# Patient Record
Sex: Male | Born: 1940 | Race: Black or African American | Hispanic: No | Marital: Married | Smoking: Current every day smoker
Health system: Southern US, Community
[De-identification: ages and names within clinical notes are randomized; demographics above are authoritative.]

## PROBLEM LIST (undated history)

## (undated) DIAGNOSIS — F17210 Nicotine dependence, cigarettes, uncomplicated: Secondary | ICD-10-CM

## (undated) DIAGNOSIS — K5730 Diverticulosis of large intestine without perforation or abscess without bleeding: Secondary | ICD-10-CM

## (undated) DIAGNOSIS — Z8701 Personal history of pneumonia (recurrent): Secondary | ICD-10-CM

## (undated) DIAGNOSIS — K219 Gastro-esophageal reflux disease without esophagitis: Secondary | ICD-10-CM

## (undated) DIAGNOSIS — R5383 Other fatigue: Secondary | ICD-10-CM

## (undated) DIAGNOSIS — K635 Polyp of colon: Secondary | ICD-10-CM

## (undated) DIAGNOSIS — I82409 Acute embolism and thrombosis of unspecified deep veins of unspecified lower extremity: Secondary | ICD-10-CM

## (undated) DIAGNOSIS — E7800 Pure hypercholesterolemia, unspecified: Secondary | ICD-10-CM

## (undated) DIAGNOSIS — M542 Cervicalgia: Secondary | ICD-10-CM

## (undated) DIAGNOSIS — F5104 Psychophysiologic insomnia: Secondary | ICD-10-CM

## (undated) DIAGNOSIS — M47817 Spondylosis without myelopathy or radiculopathy, lumbosacral region: Secondary | ICD-10-CM

## (undated) DIAGNOSIS — F419 Anxiety disorder, unspecified: Secondary | ICD-10-CM

## (undated) DIAGNOSIS — R9389 Abnormal findings on diagnostic imaging of other specified body structures: Secondary | ICD-10-CM

## (undated) DIAGNOSIS — I4891 Unspecified atrial fibrillation: Secondary | ICD-10-CM

## (undated) DIAGNOSIS — J449 Chronic obstructive pulmonary disease, unspecified: Secondary | ICD-10-CM

## (undated) DIAGNOSIS — F1010 Alcohol abuse, uncomplicated: Secondary | ICD-10-CM

## (undated) DIAGNOSIS — R350 Frequency of micturition: Secondary | ICD-10-CM

## (undated) HISTORY — DX: Anxiety disorder, unspecified: F41.9

## (undated) HISTORY — DX: Cervicalgia: M54.2

## (undated) HISTORY — DX: Acute embolism and thrombosis of unspecified deep veins of unspecified lower extremity: I82.409

## (undated) HISTORY — PX: OTHER SURGICAL HISTORY: SHX169

## (undated) HISTORY — DX: Frequency of micturition: R35.0

## (undated) HISTORY — DX: Personal history of pneumonia (recurrent): Z87.01

## (undated) HISTORY — DX: Unspecified atrial fibrillation: I48.91

## (undated) HISTORY — DX: Spondylosis without myelopathy or radiculopathy, lumbosacral region: M47.817

## (undated) HISTORY — DX: Alcohol abuse, uncomplicated: F10.10

## (undated) HISTORY — DX: Abnormal findings on diagnostic imaging of other specified body structures: R93.89

## (undated) HISTORY — DX: Polyp of colon: K63.5

## (undated) HISTORY — DX: Nicotine dependence, cigarettes, uncomplicated: F17.210

## (undated) HISTORY — DX: Pure hypercholesterolemia, unspecified: E78.00

## (undated) HISTORY — DX: Chronic obstructive pulmonary disease, unspecified: J44.9

## (undated) HISTORY — DX: Psychophysiologic insomnia: F51.04

## (undated) HISTORY — DX: Gastro-esophageal reflux disease without esophagitis: K21.9

## (undated) HISTORY — DX: Other fatigue: R53.83

## (undated) HISTORY — DX: Diverticulosis of large intestine without perforation or abscess without bleeding: K57.30

---

## 1998-10-15 ENCOUNTER — Ambulatory Visit (HOSPITAL_COMMUNITY): Admission: RE | Admit: 1998-10-15 | Discharge: 1998-10-15 | Payer: Self-pay | Admitting: *Deleted

## 1998-11-19 ENCOUNTER — Ambulatory Visit (HOSPITAL_COMMUNITY): Admission: RE | Admit: 1998-11-19 | Discharge: 1998-11-19 | Payer: Self-pay | Admitting: *Deleted

## 1999-08-20 ENCOUNTER — Encounter (INDEPENDENT_AMBULATORY_CARE_PROVIDER_SITE_OTHER): Payer: Self-pay | Admitting: Specialist

## 1999-08-20 ENCOUNTER — Other Ambulatory Visit: Admission: RE | Admit: 1999-08-20 | Discharge: 1999-08-20 | Payer: Self-pay | Admitting: Gastroenterology

## 2001-02-14 ENCOUNTER — Emergency Department (HOSPITAL_COMMUNITY): Admission: EM | Admit: 2001-02-14 | Discharge: 2001-02-14 | Payer: Self-pay

## 2005-03-26 ENCOUNTER — Emergency Department (HOSPITAL_COMMUNITY): Admission: EM | Admit: 2005-03-26 | Discharge: 2005-03-26 | Payer: Self-pay | Admitting: Family Medicine

## 2005-04-02 ENCOUNTER — Ambulatory Visit: Payer: Self-pay | Admitting: Pulmonary Disease

## 2005-04-05 ENCOUNTER — Encounter (HOSPITAL_BASED_OUTPATIENT_CLINIC_OR_DEPARTMENT_OTHER): Admission: RE | Admit: 2005-04-05 | Discharge: 2005-04-25 | Payer: Self-pay | Admitting: Surgery

## 2005-04-08 ENCOUNTER — Ambulatory Visit: Payer: Self-pay | Admitting: Pulmonary Disease

## 2005-07-23 ENCOUNTER — Ambulatory Visit: Payer: Self-pay | Admitting: Pulmonary Disease

## 2005-08-02 ENCOUNTER — Ambulatory Visit (HOSPITAL_COMMUNITY): Admission: RE | Admit: 2005-08-02 | Discharge: 2005-08-02 | Payer: Self-pay | Admitting: Neurosurgery

## 2005-12-02 ENCOUNTER — Ambulatory Visit: Payer: Self-pay | Admitting: Internal Medicine

## 2005-12-16 ENCOUNTER — Ambulatory Visit: Payer: Self-pay | Admitting: Pulmonary Disease

## 2006-02-24 ENCOUNTER — Ambulatory Visit: Payer: Self-pay | Admitting: Pulmonary Disease

## 2006-02-24 LAB — CONVERTED CEMR LAB
ALT: 25 units/L (ref 0–40)
AST: 33 units/L (ref 0–37)
BUN: 11 mg/dL (ref 6–23)
Calcium: 9.3 mg/dL (ref 8.4–10.5)
Creatinine, Ser: 1.2 mg/dL (ref 0.4–1.5)
Glucose, Bld: 95 mg/dL (ref 70–99)
Total Protein: 7 g/dL (ref 6.0–8.3)

## 2006-02-26 ENCOUNTER — Ambulatory Visit: Payer: Self-pay | Admitting: Cardiology

## 2006-07-03 ENCOUNTER — Ambulatory Visit: Payer: Self-pay | Admitting: Pulmonary Disease

## 2006-07-03 LAB — CONVERTED CEMR LAB
ALT: 31 units/L (ref 0–40)
AST: 42 units/L — ABNORMAL HIGH (ref 0–37)
Alkaline Phosphatase: 73 units/L (ref 39–117)
Basophils Relative: 0.3 % (ref 0.0–1.0)
Calcium: 8.9 mg/dL (ref 8.4–10.5)
Eosinophils Relative: 0.8 % (ref 0.0–5.0)
GFR calc Af Amer: 108 mL/min
GFR calc non Af Amer: 89 mL/min
HCT: 40.4 % (ref 39.0–52.0)
Monocytes Absolute: 0.5 10*3/uL (ref 0.2–0.7)
Monocytes Relative: 14.2 % — ABNORMAL HIGH (ref 3.0–11.0)
Neutro Abs: 1.4 10*3/uL (ref 1.4–7.7)
Platelets: 275 10*3/uL (ref 150–400)
Potassium: 4.2 meq/L (ref 3.5–5.1)
RBC: 3.92 M/uL — ABNORMAL LOW (ref 4.22–5.81)
RDW: 13.4 % (ref 11.5–14.6)
Sodium: 141 meq/L (ref 135–145)
Total Protein: 6.6 g/dL (ref 6.0–8.3)
WBC: 3.5 10*3/uL — ABNORMAL LOW (ref 4.5–10.5)

## 2007-07-24 DIAGNOSIS — K219 Gastro-esophageal reflux disease without esophagitis: Secondary | ICD-10-CM | POA: Insufficient documentation

## 2007-07-24 DIAGNOSIS — J449 Chronic obstructive pulmonary disease, unspecified: Secondary | ICD-10-CM | POA: Insufficient documentation

## 2007-12-21 ENCOUNTER — Ambulatory Visit: Payer: Self-pay | Admitting: Pulmonary Disease

## 2007-12-21 DIAGNOSIS — I739 Peripheral vascular disease, unspecified: Secondary | ICD-10-CM

## 2007-12-21 DIAGNOSIS — F1010 Alcohol abuse, uncomplicated: Secondary | ICD-10-CM

## 2007-12-21 DIAGNOSIS — E7800 Pure hypercholesterolemia, unspecified: Secondary | ICD-10-CM

## 2007-12-21 DIAGNOSIS — G4700 Insomnia, unspecified: Secondary | ICD-10-CM | POA: Insufficient documentation

## 2007-12-21 DIAGNOSIS — F17200 Nicotine dependence, unspecified, uncomplicated: Secondary | ICD-10-CM | POA: Insufficient documentation

## 2007-12-21 DIAGNOSIS — F411 Generalized anxiety disorder: Secondary | ICD-10-CM

## 2007-12-21 DIAGNOSIS — R930 Abnormal findings on diagnostic imaging of skull and head, not elsewhere classified: Secondary | ICD-10-CM

## 2007-12-21 DIAGNOSIS — D126 Benign neoplasm of colon, unspecified: Secondary | ICD-10-CM

## 2007-12-21 DIAGNOSIS — R350 Frequency of micturition: Secondary | ICD-10-CM

## 2007-12-21 DIAGNOSIS — K5730 Diverticulosis of large intestine without perforation or abscess without bleeding: Secondary | ICD-10-CM

## 2007-12-21 DIAGNOSIS — M542 Cervicalgia: Secondary | ICD-10-CM

## 2007-12-21 DIAGNOSIS — R5383 Other fatigue: Secondary | ICD-10-CM | POA: Insufficient documentation

## 2007-12-21 DIAGNOSIS — M47817 Spondylosis without myelopathy or radiculopathy, lumbosacral region: Secondary | ICD-10-CM

## 2008-01-03 LAB — CONVERTED CEMR LAB
ALT: 32 units/L (ref 0–53)
AST: 31 units/L (ref 0–37)
Albumin: 3.3 g/dL — ABNORMAL LOW (ref 3.5–5.2)
Alkaline Phosphatase: 70 units/L (ref 39–117)
BUN: 11 mg/dL (ref 6–23)
Bacteria, UA: NEGATIVE
Basophils Absolute: 0 10*3/uL (ref 0.0–0.1)
Basophils Relative: 0 % (ref 0.0–3.0)
Bilirubin Urine: NEGATIVE
Bilirubin, Direct: 0.1 mg/dL (ref 0.0–0.3)
CO2: 27 meq/L (ref 19–32)
Calcium: 8.9 mg/dL (ref 8.4–10.5)
Chloride: 107 meq/L (ref 96–112)
Creatinine, Ser: 0.9 mg/dL (ref 0.4–1.5)
Crystals: NEGATIVE
Eosinophils Absolute: 0 10*3/uL (ref 0.0–0.7)
Eosinophils Relative: 0.9 % (ref 0.0–5.0)
GFR calc Af Amer: 108 mL/min
GFR calc non Af Amer: 89 mL/min
Glucose, Bld: 81 mg/dL (ref 70–99)
HCT: 39.8 % (ref 39.0–52.0)
Hemoglobin, Urine: NEGATIVE
Hemoglobin: 13.8 g/dL (ref 13.0–17.0)
Ketones, ur: NEGATIVE mg/dL
Leukocytes, UA: NEGATIVE
Lymphocytes Relative: 42.5 % (ref 12.0–46.0)
MCHC: 34.8 g/dL (ref 30.0–36.0)
MCV: 106.4 fL — ABNORMAL HIGH (ref 78.0–100.0)
Monocytes Absolute: 0.6 10*3/uL (ref 0.1–1.0)
Monocytes Relative: 17.9 % — ABNORMAL HIGH (ref 3.0–12.0)
Neutro Abs: 1.3 10*3/uL — ABNORMAL LOW (ref 1.4–7.7)
Neutrophils Relative %: 38.7 % — ABNORMAL LOW (ref 43.0–77.0)
Nitrite: NEGATIVE
PSA: 0.56 ng/mL (ref 0.10–4.00)
Platelets: 388 10*3/uL (ref 150–400)
Potassium: 3.8 meq/L (ref 3.5–5.1)
RBC: 3.74 M/uL — ABNORMAL LOW (ref 4.22–5.81)
RDW: 12.5 % (ref 11.5–14.6)
Sed Rate: 24 mm/hr — ABNORMAL HIGH (ref 0–16)
Sodium: 140 meq/L (ref 135–145)
Specific Gravity, Urine: >=1.03 (ref 1.000–1.03)
TSH: 1.62 microintl units/mL (ref 0.35–5.50)
Total Bilirubin: 0.5 mg/dL (ref 0.3–1.2)
Total Protein, Urine: NEGATIVE mg/dL
Total Protein: 6.5 g/dL (ref 6.0–8.3)
Urine Glucose: NEGATIVE mg/dL
Urobilinogen, UA: 0.2 (ref 0.0–1.0)
WBC, UA: NONE SEEN cells/hpf
WBC: 3.4 10*3/uL — ABNORMAL LOW (ref 4.5–10.5)
pH: 5.5 (ref 5.0–8.0)

## 2008-06-28 ENCOUNTER — Encounter (INDEPENDENT_AMBULATORY_CARE_PROVIDER_SITE_OTHER): Payer: Self-pay | Admitting: *Deleted

## 2008-08-17 ENCOUNTER — Emergency Department (HOSPITAL_COMMUNITY): Admission: EM | Admit: 2008-08-17 | Discharge: 2008-08-17 | Payer: Self-pay | Admitting: Family Medicine

## 2008-11-15 ENCOUNTER — Ambulatory Visit: Payer: Self-pay | Admitting: Pulmonary Disease

## 2008-11-15 ENCOUNTER — Ambulatory Visit: Payer: Self-pay | Admitting: Gastroenterology

## 2008-11-15 ENCOUNTER — Encounter: Payer: Self-pay | Admitting: Adult Health

## 2008-11-15 LAB — CONVERTED CEMR LAB
Basophils Relative: 0.8 % (ref 0.0–3.0)
HCT: 48.3 % (ref 39.0–52.0)
MCHC: 34 g/dL (ref 30.0–36.0)
MCV: 104.6 fL — ABNORMAL HIGH (ref 78.0–100.0)
Monocytes Relative: 11.6 % (ref 3.0–12.0)
Neutrophils Relative %: 56.4 % (ref 43.0–77.0)

## 2008-11-22 LAB — CONVERTED CEMR LAB
Albumin: 3.9 g/dL (ref 3.5–5.2)
Alkaline Phosphatase: 79 units/L (ref 39–117)
Bilirubin, Direct: 0.2 mg/dL (ref 0.0–0.3)
Chloride: 101 meq/L (ref 96–112)
Cholesterol: 197 mg/dL (ref 0–200)
Creatinine, Ser: 1 mg/dL (ref 0.4–1.5)
Glucose, Bld: 85 mg/dL (ref 70–99)
HDL: 73.1 mg/dL (ref >39.00)
Hemoglobin, Urine: NEGATIVE
Hgb A1c MFr Bld: 6 % (ref 4.6–6.5)
Ketones, ur: 15 mg/dL
Leukocytes, UA: NEGATIVE
PSA: 0.51 ng/mL (ref 0.10–4.00)
TSH: 1.17 microintl units/mL (ref 0.35–5.50)
Total Bilirubin: 0.8 mg/dL (ref 0.3–1.2)
Total Protein: 7.7 g/dL (ref 6.0–8.3)
Triglycerides: 58 mg/dL (ref 0.0–149.0)

## 2008-12-02 ENCOUNTER — Ambulatory Visit: Payer: Self-pay | Admitting: Gastroenterology

## 2008-12-02 ENCOUNTER — Encounter: Payer: Self-pay | Admitting: Gastroenterology

## 2008-12-06 ENCOUNTER — Encounter: Payer: Self-pay | Admitting: Gastroenterology

## 2009-06-27 ENCOUNTER — Ambulatory Visit: Payer: Self-pay | Admitting: Pulmonary Disease

## 2009-06-28 ENCOUNTER — Encounter (INDEPENDENT_AMBULATORY_CARE_PROVIDER_SITE_OTHER): Payer: Self-pay | Admitting: Internal Medicine

## 2009-06-28 ENCOUNTER — Ambulatory Visit: Payer: Self-pay | Admitting: Internal Medicine

## 2009-06-28 ENCOUNTER — Inpatient Hospital Stay (HOSPITAL_COMMUNITY): Admission: EM | Admit: 2009-06-28 | Discharge: 2009-07-11 | Payer: Self-pay | Admitting: Emergency Medicine

## 2009-07-04 ENCOUNTER — Telehealth (INDEPENDENT_AMBULATORY_CARE_PROVIDER_SITE_OTHER): Payer: Self-pay | Admitting: *Deleted

## 2009-07-06 ENCOUNTER — Ambulatory Visit: Payer: Self-pay | Admitting: Infectious Diseases

## 2009-07-10 ENCOUNTER — Ambulatory Visit: Payer: Self-pay | Admitting: Vascular Surgery

## 2009-07-10 ENCOUNTER — Encounter (INDEPENDENT_AMBULATORY_CARE_PROVIDER_SITE_OTHER): Payer: Self-pay | Admitting: Internal Medicine

## 2009-07-11 ENCOUNTER — Encounter: Payer: Self-pay | Admitting: Adult Health

## 2009-07-14 ENCOUNTER — Telehealth: Payer: Self-pay | Admitting: Pulmonary Disease

## 2009-07-14 ENCOUNTER — Encounter: Payer: Self-pay | Admitting: Adult Health

## 2009-07-23 ENCOUNTER — Encounter: Payer: Self-pay | Admitting: Adult Health

## 2009-07-26 ENCOUNTER — Encounter: Payer: Self-pay | Admitting: Adult Health

## 2009-08-16 ENCOUNTER — Telehealth: Payer: Self-pay | Admitting: Adult Health

## 2009-08-17 ENCOUNTER — Telehealth (INDEPENDENT_AMBULATORY_CARE_PROVIDER_SITE_OTHER): Payer: Self-pay | Admitting: *Deleted

## 2009-08-17 ENCOUNTER — Ambulatory Visit: Payer: Self-pay | Admitting: Pulmonary Disease

## 2009-08-17 DIAGNOSIS — I82409 Acute embolism and thrombosis of unspecified deep veins of unspecified lower extremity: Secondary | ICD-10-CM

## 2009-08-18 ENCOUNTER — Telehealth (INDEPENDENT_AMBULATORY_CARE_PROVIDER_SITE_OTHER): Payer: Self-pay | Admitting: *Deleted

## 2009-08-18 LAB — CONVERTED CEMR LAB
ALT: 21 units/L (ref 0–53)
AST: 20 units/L (ref 0–37)
Albumin: 3.7 g/dL (ref 3.5–5.2)
Alkaline Phosphatase: 78 units/L (ref 39–117)
BUN: 11 mg/dL (ref 6–23)
Bilirubin, Direct: 0.2 mg/dL (ref 0.0–0.3)
CO2: 28 meq/L (ref 19–32)
Chloride: 103 meq/L (ref 96–112)
Glucose, Bld: 67 mg/dL — ABNORMAL LOW (ref 70–99)
HCT: 36.9 % — ABNORMAL LOW (ref 39.0–52.0)
Platelets: 347 10*3/uL (ref 150.0–400.0)
Potassium: 4.7 meq/L (ref 3.5–5.1)
RBC: 3.69 M/uL — ABNORMAL LOW (ref 4.22–5.81)
RDW: 13.8 % (ref 11.5–14.6)
Sodium: 139 meq/L (ref 135–145)
Total Protein: 7.1 g/dL (ref 6.0–8.3)
Transferrin: 233.3 mg/dL (ref 212.0–360.0)
WBC: 4.1 10*3/uL — ABNORMAL LOW (ref 4.5–10.5)

## 2009-08-21 ENCOUNTER — Ambulatory Visit: Payer: Self-pay | Admitting: Cardiology

## 2009-08-21 LAB — CONVERTED CEMR LAB: POC INR: 1.1

## 2009-08-25 ENCOUNTER — Ambulatory Visit: Payer: Self-pay | Admitting: Cardiology

## 2009-09-01 ENCOUNTER — Ambulatory Visit: Payer: Self-pay | Admitting: Cardiology

## 2009-09-01 ENCOUNTER — Encounter: Payer: Self-pay | Admitting: Pulmonary Disease

## 2009-09-01 LAB — CONVERTED CEMR LAB: POC INR: 2.3

## 2009-09-11 ENCOUNTER — Telehealth: Payer: Self-pay | Admitting: Cardiology

## 2009-09-13 ENCOUNTER — Ambulatory Visit: Payer: Self-pay | Admitting: Cardiology

## 2009-09-13 LAB — CONVERTED CEMR LAB: POC INR: 2.3

## 2009-10-20 ENCOUNTER — Ambulatory Visit: Payer: Self-pay | Admitting: Cardiology

## 2009-10-20 LAB — CONVERTED CEMR LAB: POC INR: 3.9

## 2009-10-31 ENCOUNTER — Ambulatory Visit: Payer: Self-pay | Admitting: Pulmonary Disease

## 2009-10-31 DIAGNOSIS — I4891 Unspecified atrial fibrillation: Secondary | ICD-10-CM | POA: Insufficient documentation

## 2009-11-02 ENCOUNTER — Ambulatory Visit: Payer: Self-pay | Admitting: Cardiology

## 2009-11-12 DIAGNOSIS — J189 Pneumonia, unspecified organism: Secondary | ICD-10-CM

## 2009-11-17 ENCOUNTER — Telehealth: Payer: Self-pay | Admitting: Cardiology

## 2009-11-30 ENCOUNTER — Ambulatory Visit: Payer: Self-pay | Admitting: Internal Medicine

## 2009-12-04 ENCOUNTER — Telehealth: Payer: Self-pay | Admitting: Pulmonary Disease

## 2009-12-05 ENCOUNTER — Telehealth (INDEPENDENT_AMBULATORY_CARE_PROVIDER_SITE_OTHER): Payer: Self-pay | Admitting: *Deleted

## 2009-12-06 ENCOUNTER — Telehealth: Payer: Self-pay | Admitting: Pulmonary Disease

## 2009-12-14 ENCOUNTER — Ambulatory Visit: Payer: Self-pay | Admitting: Cardiology

## 2009-12-26 ENCOUNTER — Telehealth: Payer: Self-pay | Admitting: Pulmonary Disease

## 2009-12-26 ENCOUNTER — Ambulatory Visit: Payer: Self-pay | Admitting: Cardiovascular Disease

## 2010-01-10 ENCOUNTER — Ambulatory Visit: Payer: Self-pay | Admitting: Internal Medicine

## 2010-01-10 ENCOUNTER — Encounter: Payer: Self-pay | Admitting: Pulmonary Disease

## 2010-02-07 ENCOUNTER — Ambulatory Visit: Admission: RE | Admit: 2010-02-07 | Discharge: 2010-02-07 | Payer: Self-pay | Source: Home / Self Care

## 2010-02-07 LAB — CONVERTED CEMR LAB: POC INR: 1.7

## 2010-02-25 ENCOUNTER — Encounter: Payer: Self-pay | Admitting: Neurosurgery

## 2010-03-06 NOTE — Medication Information (Signed)
Summary: rov/sp  Anticoagulant Therapy  Managed by: Reina Fuse, PharmD Referring MD: Kriste Basque PCP: Merry Proud MD: Jens Som MD, Arlys John Indication 1: DVT Lab Used: [facility] [place] Site: [address] INR POC 2.3 INR RANGE 2.0-3.0  Dietary changes: no    Health status changes: no    Bleeding/hemorrhagic complications: no    Recent/future hospitalizations: no    Any changes in medication regimen? no    Recent/future dental: no  Any missed doses?: no       Is patient compliant with meds? yes       Current Medications (verified): 1)  Aspirin Adult Low Strength 81 Mg Tbec (Aspirin) .... Take 1 Tablet By Mouth Once A Day 2)  Viagra 100 Mg  Tabs (Sildenafil Citrate) .... 1/2-1 Tab As Needed 3)  Vitamin D 1000 Unit  Tabs (Cholecalciferol) .... Take 1 Tablet By Mouth Once A Day 4)  Multivitamins   Tabs (Multiple Vitamin) .... Take 1 Tablet By Mouth Once A Day 5)  Diltiazem Hcl Cr 180 Mg Xr24h-Cap (Diltiazem Hcl) .... Take 1 Capsule By Mouth Once A Day 6)  Folic Acid 1 Mg Tabs (Folic Acid) .... Take 1 Tablet By Mouth Once A Day 7)  Vitamin B-1 100 Mg Tabs (Thiamine Hcl) .... Take 1 Tablet By Mouth Once A Day 8)  Enoxaparin Sodium 80 Mg/0.62ml Soln (Enoxaparin Sodium) .... 70 Mg Subcutaneously Two Times A Day 9)  Warfarin Sodium 5 Mg Tabs (Warfarin Sodium) .... Take As Directed By Coumadin Clinic.  Allergies (verified): No Known Drug Allergies  Anticoagulation Management History:      The patient is taking warfarin and comes in today for a routine follow up visit.  Positive risk factors for bleeding include an age of 72 years or older.  The bleeding index is 'intermediate risk'.  Negative CHADS2 values include Age > 72 years old.  His last INR was 1.0 ratio.  Anticoagulation responsible provider: Jens Som MD, Arlys John.  INR POC: 2.3.  Cuvette Lot#: [id number].  Exp: 11/2010.    Anticoagulation Management Assessment/Plan:      The patient's current anticoagulation  dose is Warfarin sodium 5 mg tabs: Take as directed by coumadin clinic..  The target INR is 2.0-3.0.  The next INR is due 10/11/2009.  Anticoagulation instructions were given to patient.  Results were reviewed/authorized by Reina Fuse, PharmD.  He was notified by Reina Fuse, PharmD.         Prior Anticoagulation Instructions: INR 2.3  Continue same dose of 1 1/2 tablets every day.  Recheck in 10 days.   Current Anticoagulation Instructions: INR 2.3  Continue taking Coumadin 1.5 tabs (7.5 mg) every day. Return to clinic in 4 weeks.

## 2010-03-06 NOTE — Progress Notes (Signed)
Summary: RX REFILLS  Phone Note Refill Request Message from:  Pharmacy on November 17, 2009 2:57 PM  Refills Requested: Medication #1:  WARFARIN SODIUM 5 MG TABS Take as directed by coumadin clinic. [phone] PT IS WAIT FOR RX.   Method Requested: Telephone to Pharmacy Initial call taken by: Roe Coombs,  November 17, 2009 2:56 PM [facility]er: [facility]  [address] [id number] Reason for Call: Needs renewal    Prescriptions: WARFARIN SODIUM 5 MG TABS (WARFARIN SODIUM) Take as directed by coumadin clinic.  #45 x 2   Entered by:   Cloyde Reams RN   Authorized by:   Marca Ancona, MD   Signed by:   Cloyde Reams RN on 11/17/2009   Method used:   [facility]ronically to        [facility]  [address] [id number] ([facility]       [address]       [place], [place]  [address]       Ph: [phone]       Fax: [phone]   RxID:   [id number]

## 2010-03-06 NOTE — Medication Information (Signed)
Summary: new to coumadin/dvt bilateral  Anticoagulant Therapy  Managed by: Weston Brass, PharmD Referring MD: Kriste Basque PCP: Kriste Basque Indication 1: DVT Lab Used: [facility] [place] Site: [address] INR POC 1.1 INR RANGE 2.0-3.0  Dietary changes: yes       Details: pt reports no recent alcohol use.   Health status changes: no    Bleeding/hemorrhagic complications: yes       Details: some slight bruising from Lovenox injections  Recent/future hospitalizations: no    Any changes in medication regimen? yes       Details: Pt started on Coumadin on 7/15 with 5mg  daily.    Recent/future dental: no  Any missed doses?: no        Comments: Pt diagnosed with DVT > 35month ago during hospitalization.  Has been in rehab.  He has been taking Lovenox injections since diagnosis but is now transitioning to Coumadin.  Issues with compliance noted during hospitalization.  Per daughter, pt has diagnosis of bipolar but refuses to take his medications for this.  He also has a hx of alcohol abuse.     Pt and daughter were educated on bleeding risks, dietary concerns including the effects of alcohol and medication interactions.  They expressed understanding.   Allergies: No Known Drug Allergies  Anticoagulation Management History:      The patient is taking warfarin and comes in today for a routine follow up visit.  Positive risk factors for bleeding include an age of 72 years or older.  The bleeding index is 'intermediate risk'.  Negative CHADS2 values include Age > 90 years old.  His last INR was 1.0 ratio.  INR POC: 1.1.  Cuvette Lot#: [id number].  Exp: 11/2010.    Anticoagulation Management Assessment/Plan:      The patient's current anticoagulation dose is Warfarin sodium 5 mg tabs: Take as directed by coumadin clinic..  The target INR is 2.0-3.0.  The next INR is due 08/25/2009.  Anticoagulation instructions were given to patient.  Results were reviewed/authorized by Weston Brass, PharmD.   He was notified by Weston Brass PharmD.         Current Anticoagulation Instructions: INR 1.1   Take 2 tablets today and tomorrow then start 1 1/2 tablets daily on Wednesday.  Continue Lovenox injections.

## 2010-03-06 NOTE — Medication Information (Signed)
Summary: rov/jm  Anticoagulant Therapy  Managed by: Leota Sauers, PharmD, BCPS, CPP Referring MD: Kriste Basque PCP: Merry Proud MD: Shirlee Latch MD, Dalton Indication 1: DVT Lab Used: [facility] [place] Site: [address] INR POC 3.2 INR RANGE 2.0-3.0  Dietary changes: no    Health status changes: no    Bleeding/hemorrhagic complications: no    Recent/future hospitalizations: no    Any changes in medication regimen? no    Recent/future dental: no  Any missed doses?: no       Is patient compliant with meds? yes      Comments: feels tired and weak, saw Dr. Kriste Basque this week  Current Medications (verified): 1)  Warfarin Sodium 5 Mg Tabs (Warfarin Sodium) .... Take As Directed By Coumadin Clinic. 2)  Diltiazem Hcl Cr 180 Mg Xr24h-Cap (Diltiazem Hcl) .... Take 1 Capsule By Mouth Once A Day 3)  Mens Multivitamin Plus  Tabs (Multiple Vitamins-Minerals) .... Take 1 Tab By Mouth Once Daily.Marland KitchenMarland Kitchen 4)  Tramadol Hcl 50 Mg Tabs (Tramadol Hcl) .... Take One Tablet By Mouth Every 6 Hours As Needed For Pain  Allergies (verified): No Known Drug Allergies  Anticoagulation Management History:      Positive risk factors for bleeding include an age of 72 years or older.  The bleeding index is 'intermediate risk'.  Negative CHADS2 values include Age > 36 years old.  His last INR was 1.0 ratio.  Anticoagulation responsible provider: Shirlee Latch MD, [place].  INR POC: 3.2.  Exp: 11/2010.    Anticoagulation Management Assessment/Plan:      The patient's current anticoagulation dose is Warfarin sodium 5 mg tabs: Take as directed by coumadin clinic..  The target INR is 2.0-3.0.  The next INR is due 11/30/2009.  Anticoagulation instructions were given to patient.  Results were reviewed/authorized by Leota Sauers, PharmD, BCPS, CPP.         Prior Anticoagulation Instructions: INR 3.9  Skip tomorrow's (Saturday 9/17) dose of Coumadin.  Take all of your other medications as usual.  On Sunday begin new  dose of Coumadin: one and one-half tablets every day except one tablet on Monday.  Return to clinic in two weeks.     Current Anticoagulation Instructions: INR 3.2  Coumadin 7.5mg   =1 and 1/2 tab each day EXCEPT  1 tab = 5mg  on Monday

## 2010-03-06 NOTE — Progress Notes (Signed)
Summary: talk to nurse-LMTCB  Phone Note Call from Patient Call back at [phone] daughter cell   Caller: Daughter//connie brown Call For: Mahonri Seiden Summary of Call: States her dad was admitted to rehab at [facility] and it's not working out, wants to know if a therapist can come to the home to do rehab or if she and her sister could transport him to his therapy sessions, also wants to know if there is any way they could bring him home today, pls advise. Initial call taken by: Darletta Moll,  July 14, 2009 10:24 AM  Follow-up for Phone Call        [facility].  Carron Curie CMA  July 14, 2009 11:10 AM   Caller Anice Paganini) advised she does not feel her father is receiving adequate care. Pt was d/c to [facility] on [address] from [place]. Junious Dresser advised when she went to see the pt yesterday his bed was already soiled and took up to an hour before they could get someone to come and change same. Also pt was being keep in a "cold" room due to sharing a room with a resident that has a seizure disorder. Pt's daughter would like to bring pt home today. Junious Dresser advised she will do whatever SN advises but really wants to get father out of this facility today. Please advise. Zackery Barefoot CMA  July 14, 2009 3:21 PM   Additional Follow-up for Phone Call Additional follow up Details #1::        she needs to discuss with staff and physician at the rehab center--they need to know her concerns  Additional Follow-up by: Philipp Deputy CMA,  July 14, 2009 4:39 PM    Additional Follow-up for Phone Call Additional follow up Details #2::    Caller advised and is okay with same. Zackery Barefoot CMA  July 14, 2009 4:49 PM

## 2010-03-06 NOTE — Assessment & Plan Note (Signed)
Summary: NP follow up - questions about meds   CC:  last seen by SN 2009 - has questions about his medications.  states was given lovenox injections while in rehab and but only given 3 to go home with - would like to know if this med should be continues..  History of Present Illness: 70 y/o AAM with known history of Hyperlipidemia, PFV, GERD.   28-Nov-2008--Last seen by Dr. Kriste [name] in 2009. Presents with an acute office visit.  Pt c/o lack of energy, malaise,  poor appetite, stiff in the morning, polyuria esp. after he drinks 12 pack of beers/day Pt is still smoking on an average of 1-2 packs of cigarrettes/day and drink 12 packs of beers/day.   August 17, 2009--Presents for follow up. 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 Admitted w/ decreased LOC initially found to have bilateral PNA. Started on aggressive IV abx, pulmonary hygiene. BC were positive for Prevotella bacteremia. ID saw pt , tx w/ Invanz x 14 d. Developed afib w/ RVR , tx w/ cardizem. Bilateral DVT found on dopplers. Pt declined filter and opted for lovenox instead of coumadin per discharge summary. Pt was discharged to rehab 6/7-7/12. He presents today w/ daughter. He says he has not smoked or had any alcohol since discharge. He is using walker and slowly getting stronger but still very weak w/ low energy. No significant cough or congestion. He is currently on lovenox 70mg  two times a day. We discussed his options for DVT. He wants to try coumadin and stop lovenox. Does not like getting shots daily. Denies chest pain, dyspnea, orthopnea, hemoptysis, fever, n/v/d, bloody stools, hematuria.  I discussed in detail the dangers /risk of coumadin therapy. I have emphasized the importance of no alcohol and risk of bleeding w/ alcohol/coumadin. He says he is not going to drink any more. Daughter assures me they are helping pt w/ meds and recovery  efforts.   Medications Prior to Update: 1)  Aspirin Adult Low Strength 81 Mg Tbec (Aspirin) .... Take 1 Tablet By Mouth Once A Day 2)  Viagra 100 Mg  Tabs (Sildenafil Citrate) .... 1/2-1 Tab As Needed 3)  Vitamin D 1000 Unit  Tabs (Cholecalciferol) .... Take 1 Tablet By Mouth Once A Day 4)  Multivitamins   Tabs (Multiple Vitamin) .... Take 1 Tablet By Mouth Once A Day  Current Medications (verified): 1)  Aspirin Adult Low Strength 81 Mg Tbec (Aspirin) .... Take 1 Tablet By Mouth Once A Day 2)  Viagra 100 Mg  Tabs (Sildenafil Citrate) .... 1/2-1 Tab As Needed 3)  Vitamin D 1000 Unit  Tabs (Cholecalciferol) .... Take 1 Tablet By Mouth Once A Day 4)  Multivitamins   Tabs (Multiple Vitamin) .... Take 1 Tablet By Mouth Once A Day 5)  Diltiazem Hcl Cr 180 Mg Xr24h-Cap (Diltiazem Hcl) .... Take 1 Capsule By Mouth Once A Day 6)  Folic Acid 1 Mg Tabs (Folic Acid) .... Take 1 Tablet By Mouth Once A Day 7)  Vitamin B-1 100 Mg Tabs (Thiamine Hcl) .... Take 1 Tablet By Mouth Once A Day  Allergies (verified): No Known Drug Allergies  Past History:  Past Surgical History: Last updated: 12/21/2007 S/P 3 separate lumbar laminectomies over the yrs... S/P right CTS release by Kurt [name] 12/00  Family History: Last updated: 2008-11-28 Father died at age 70 w/ prostate cancer Mother died at age 35 from old age.Marland KitchenMarland Kitchen  Social History: Last updated: 08/17/2009 Married 1 Daughter former smoker, quit july 2011 - 2 ppd x64yrs + Etoh - beer daily none since 6/11.  Retired from [employer]  Risk Factors: Smoking Status: current (12/21/2007)  Past Medical History: COPD (ICD-496),  CIGARETTE SMOKER (ICD-305.1),  Hx of ABNORMAL CHEST XRAY (ICD-793.1) - smoker 1/2 to 1 ppd x yrs   ~  Baseline CXR shows COPD, chr interstitial markings, NAD.Marland Kitchen. (no prev CT Chest).  ~  Prev PFT's 3/05 showed FVC= 3.58 (79%), FEV1= 3.02 (96%), %1sec=84, mid-flows= 139%pred... lung volumes were normal and DLCO was WNL as  well... --admitted to hospital 5/24-07/11/09  w/ Hypoxic Resp Failure 2/2 to PNA, A-Fib, Bilateral DVT, ETOH w/d-encephalopathy.   PERIPHERAL VASCULAR DISEASE (ICD-443.9) - on ASA 81mg /d...   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  HYPERCHOLESTEROLEMIA (ICD-272.0) - on diet alone...  ~  FLP 6/07 showed TChol 219, TG 56, HDL 80, LDL 135...  GERD (ICD-530.81) - on OTC Pepcid/ Zantac Prn... EGD in 1998 showed mild esophagitis...  DIVERTICULOSIS OF COLON (ICD-562.10),  COLONIC POLYPS (ICD-211.3) - last colonoscopy 7/05 by Kurt [name] showed divertics & 5mm polyp= tubular adenoma... f/u planned 5 yrs.     Social History: Married 1 Daughter former smoker, quit july 2011 - 2 ppd x39yrs + Etoh - beer daily none since 6/11.  Retired from [employer]  Review of Systems      See HPI  Vital Signs:  Patient profile:   70 year old male Height:      70 inches Weight:      165.25 pounds BMI:     23.80 O2 Sat:      97 % on Room air Temp:     98.6 degrees F oral Pulse rate:   86 / minute BP sitting:   118 / 64  (left arm) Cuff size:   regular  Vitals Entered By: Kurt Master CNA/MA (August 17, 2009 11:31 AM)  O2 Flow:  Room air CC: last seen by SN 2009 - has questions about his medications.  states was given lovenox injections while in rehab, but only given 3 to go home with - would like to know if this med should be continues. Is Patient Diabetic? No Comments Medications reviewed with patient Daytime contact number verified with patient. Kurt Master CNA/MA  August 17, 2009 11:33 AM    Physical Exam  Additional Exam:  GEN: A/Ox3; pleasant , NAD HEENT:  [place]/AT, , EACs-clear, TMs-wnl, NOSE-clear, THROAT-clear NECK:  Supple w/ fair ROM; no JVD; normal carotid impulses w/o bruits; no thyromegaly or nodules palpated; no lymphadenopathy. RESP  Clear to P & A; w/o, wheezes/ rales/ or rhonchi. CARD:  RRR, no m/r/g   GI:   Soft & nt; nml bowel sounds; no  organomegaly or masses detected. [place]co: Warm bil,  clubbing, pulses intact, trace edema on right.  Neuro: nml equal grips/strength   Impression & Recommendations:  Problem # 1:  DVT (ICD-453.40) Bilateral DVT on doppler during admission 07/11/09.  discussed tx options. this is his first DVT. Will need tx for 6 months w/ repeat doppler at that time  He wished to switch over to coumadin. will set up with coumadin clinic.  pt aware of risks/dangers of coumadin.  follow up Dr. Kriste [name] 6 weeks  Orders: Cardiology Referral (Cardiology) TLB-CBC Platelet - w/Differential (85025-CBCD) TLB-Hepatic/Liver Function Pnl (80076-HEPATIC) TLB-BMP (Basic Metabolic Panel-BMET) (80048-METABOL) TLB-PTT (85730-PTTL) TLB-PT (Protime) (85610-PTP) Est. Patient Level IV (08657)  Problem # 2:  COPD (ICD-496) s/p hypoxic resp failure 2/2 PNA follow up cxr today.  congratulated on smoking cesstation  Problem # 3:  ALCOHOL ABUSE (ICD-305.00) labs pending.  cont w/ no alcohol  Orders: TLB-B12 + Folate Pnl (16109_60454-U98/JXB) TLB-IBC Pnl (Iron/FE;Transferrin) (83550-IBC) Est. Patient Level IV (14782)  Medications Added to Medication List This Visit: 1)  Diltiazem Hcl Cr 180 Mg Xr24h-cap (Diltiazem hcl) .... Take 1 capsule by mouth once a day 2)  Folic Acid 1 Mg Tabs (Folic acid) .... Take 1 tablet by mouth once a day 3)  Vitamin B-1 100 Mg Tabs (Thiamine hcl) .... Take 1 tablet by mouth once a day 4)  Enoxaparin Sodium 80 Mg/0.61ml Soln (Enoxaparin sodium) .... 70 mg subcutaneously two times a day  Complete Medication List: 1)  Aspirin Adult Low Strength 81 Mg Tbec (Aspirin) .... Take 1 tablet by mouth once a day 2)  Viagra 100 Mg Tabs (Sildenafil citrate) .... 1/2-1 tab as needed 3)  Vitamin D 1000 Unit Tabs (Cholecalciferol) .... Take 1 tablet by mouth once a day 4)  Multivitamins Tabs (Multiple vitamin) .... Take 1 tablet by mouth once a day 5)  Diltiazem Hcl Cr 180 Mg Xr24h-cap (Diltiazem hcl) ....  Take 1 capsule by mouth once a day 6)  Folic Acid 1 Mg Tabs (Folic acid) .... Take 1 tablet by mouth once a day 7)  Vitamin B-1 100 Mg Tabs (Thiamine hcl) .... Take 1 tablet by mouth once a day 8)  Enoxaparin Sodium 80 Mg/0.31ml Soln (Enoxaparin sodium) .... 70 mg subcutaneously two times a day  Patient Instructions: 1)  Junious Dresser (family)- cell [phone] 2)  I will calll with labs.  and plan for lovenox and coumadin.  3)  Legs elevated as needed  4)  follow up Dr. Kriste [name] in 6 weeks -first available.  5)  CONGRATS ON NO ALCOHOL AND CIGARETTES 6)  Please contact office for sooner follow up if symptoms do not improve or worsen  Prescriptions: ENOXAPARIN SODIUM 80 MG/0.8ML SOLN (ENOXAPARIN SODIUM) 70 mg Subcutaneously two times a day  #10 x 0   Entered and Authorized by:   Rubye Oaks NP   Signed by:   Ronita Hargreaves NP on 08/17/2009   Method used:   Print then Give to Patient   RxID:   [id number]

## 2010-03-06 NOTE — Medication Information (Signed)
Summary: rov/sp  Anticoagulant Therapy  Managed by: Leota Sauers, PharmD, BCPS, CPP Referring MD: Kriste Basque PCP: Merry Proud MD: Ladona Ridgel MD, Sharlot Gowda Indication 1: DVT Lab Used: [facility] [place] Site: [address] INR POC 2.0 INR RANGE 2.0-3.0  Dietary changes: no    Health status changes: no    Bleeding/hemorrhagic complications: no    Recent/future hospitalizations: no    Any changes in medication regimen? no    Recent/future dental: no  Any missed doses?: no       Is patient compliant with meds? yes       Current Medications (verified): 1)  Warfarin Sodium 5 Mg Tabs (Warfarin Sodium) .... Take As Directed By Coumadin Clinic. 2)  Diltiazem Hcl Cr 180 Mg Xr24h-Cap (Diltiazem Hcl) .... Take 1 Capsule By Mouth Once A Day 3)  Mens Multivitamin Plus  Tabs (Multiple Vitamins-Minerals) .... Take 1 Tab By Mouth Once Daily.Marland KitchenMarland Kitchen 4)  Tramadol Hcl 50 Mg Tabs (Tramadol Hcl) .... Take One Tablet By Mouth Every 6 Hours As Needed For Pain 5)  Folic Acid 1 Mg Tabs (Folic Acid) .... Take One Tablet By Mouth Once Daily 6)  Carisoprodol 350 Mg Tabs (Carisoprodol) .... Take 1 Tablet By Mouth Three Times A Day As Needed Muscle Spasm  Allergies (verified): No Known Drug Allergies  Anticoagulation Management History:      The patient is taking warfarin and comes in today for a routine follow up visit.  Positive risk factors for bleeding include an age of 72 years or older.  The bleeding index is 'intermediate risk'.  Negative CHADS2 values include Age > 18 years old.  His last INR was 1.0 ratio.  Anticoagulation responsible provider: Ladona Ridgel MD, Sharlot Gowda.  INR POC: 2.0.  Cuvette Lot#: [id number].  Exp: 01/2011.    Anticoagulation Management Assessment/Plan:      The patient's current anticoagulation dose is Warfarin sodium 5 mg tabs: Take as directed by coumadin clinic..  The target INR is 2.0-3.0.  The next INR is due 02/07/2010.  Anticoagulation instructions were given to patient.   Results were reviewed/authorized by Leota Sauers, PharmD, BCPS, CPP.         Prior Anticoagulation Instructions: INR 2.0 Continue  1 1/2 pill everyday except 1 pill on Sundays, Tuesdays and Thursdays. Recheck in 2 weeks.   Current Anticoagulation Instructions: INR 2.0  Coumadin 5mg  tab - take 1.5 tabs on MON, WED, FRI, SAT take 1 tab TUE, THUR, SUN

## 2010-03-06 NOTE — Medication Information (Signed)
Summary: coumadin ck rs missed appt/mt  Anticoagulant Therapy  Managed by: Reina Fuse, PharmD Referring MD: Kriste Basque PCP: Merry Proud MD: Myrtis Ser MD, Tinnie Gens Indication 1: DVT Lab Used: [facility] [place] Site: [address] INR POC 3.9 INR RANGE 2.0-3.0  Dietary changes: no    Health status changes: no    Bleeding/hemorrhagic complications: no    Recent/future hospitalizations: no    Any changes in medication regimen? yes       Details: taking APAP for back pain occasionally  Recent/future dental: no  Any missed doses?: no       Is patient compliant with meds? yes       Allergies: No Known Drug Allergies  Anticoagulation Management History:      The patient is taking warfarin and comes in today for a routine follow up visit.  Positive risk factors for bleeding include an age of 72 years or older.  The bleeding index is 'intermediate risk'.  Negative CHADS2 values include Age > 72 years old.  His last INR was 1.0 ratio.  Anticoagulation responsible provider: Myrtis Ser MD, Tinnie Gens.  INR POC: 3.9.  Cuvette Lot#: [id number].  Exp: 11/2010.    Anticoagulation Management Assessment/Plan:      The patient's current anticoagulation dose is Warfarin sodium 5 mg tabs: Take as directed by coumadin clinic..  The target INR is 2.0-3.0.  The next INR is due 11/02/2009.  Anticoagulation instructions were given to patient.  Results were reviewed/authorized by Reina Fuse, PharmD.  He was notified by Kennieth Francois.         Prior Anticoagulation Instructions: INR 2.3  Continue taking Coumadin 1.5 tabs (7.5 mg) every day. Return to clinic in 4 weeks.   Current Anticoagulation Instructions: INR 3.9  Skip tomorrow's (Saturday 9/17) dose of Coumadin.  Take all of your other medications as usual.  On Sunday begin new dose of Coumadin: one and one-half tablets every day except one tablet on Monday.  Return to clinic in two weeks.

## 2010-03-06 NOTE — Medication Information (Signed)
Summary: rov/ewj  Anticoagulant Therapy  Managed by: Reina Fuse, PharmD Referring MD: Kriste Basque PCP: Merry Proud MD: Antoine Poche MD, Fayrene Fearing Indication 1: DVT Lab Used: [facility] [place] Site: [address] INR POC 1.4 INR RANGE 2.0-3.0  Dietary changes: yes       Details: Ate a bowl of collard greens this week and salad yesterday.  Health status changes: no    Bleeding/hemorrhagic complications: no    Recent/future hospitalizations: no    Any changes in medication regimen? no    Recent/future dental: no  Any missed doses?: no       Is patient compliant with meds? yes      Comments: Pt reports he may have not taken his doses last Friday and Saturday due to confusion over holding doses on his previous instruction sheet.   Allergies: No Known Drug Allergies  Anticoagulation Management History:      The patient is taking warfarin and comes in today for a routine follow up visit.  Positive risk factors for bleeding include an age of 72 years or older.  The bleeding index is 'intermediate risk'.  Negative CHADS2 values include Age > 72 years old.  His last INR was 1.0 ratio.  Anticoagulation responsible provider: Antoine Poche MD, Fayrene Fearing.  INR POC: 1.4.  Cuvette Lot#: [id number].  Exp: 01/2011.    Anticoagulation Management Assessment/Plan:      The patient's current anticoagulation dose is Warfarin sodium 5 mg tabs: Take as directed by coumadin clinic..  The target INR is 2.0-3.0.  The next INR is due 12/25/2009.  Anticoagulation instructions were given to patient.  Results were reviewed/authorized by Reina Fuse, PharmD.  He was notified by Reina Fuse PharmD.         Prior Anticoagulation Instructions: INR 4.0  Skip tomorrow's dosage of Coumadin, then start taking 1.5 tablets daily except 1 tablet on Sundays, Tuesdays, and Thursdays.  Recheck in 2 weeks.    Current Anticoagulation Instructions: INR 1.4  Tomorrow, Friday, November 11th, take Coumadin 2.5 tabs (12.5  mg). Then, resume taking Coumadin 1 tab (5 mg) on Sun, Tues, Thur and Coumadin 1.5 tabs (7.5 mg) on Mon, Wed, Fri, Sat.  Return to clinic in 10 days.

## 2010-03-06 NOTE — Progress Notes (Signed)
Summary: pt in hospital  Phone Note Call from Patient Call back at [phone]   Caller: Daughter connie Call For: nadel Summary of Call: pt is in [place]  for pnuemonia room # 1408 Initial call taken by: Rickard Patience,  Jul 04, 2009 4:57 PM  Follow-up for Phone Call        Tried to call pt's daughter at given number; will send message to [facility] as an FYI for SN.Reynaldo Minium CMA  Jul 04, 2009 5:07 PM   SN is aware. Randell Loop CMA  July 05, 2009 8:48 AM

## 2010-03-06 NOTE — Progress Notes (Signed)
Summary: rx req > ok for soma 350mg   Phone Note Call from Patient   Caller: Patient Call For: nadel Summary of Call: pt requests a muscle relaxer for the "tightness and spasms in his legs". [facility] [address] pt cell [phone] Initial call taken by: Tivis Ringer, CNA,  December 05, 2009 11:05 AM  Follow-up for Phone Call        Called, spoke with pt.  he is requesting a muscule relaxer for "soreness and stiffness" when walking in the back of his legs, neck, and back.   [facility] [address] [facility] [address] Kriste Basque, pls advise.  Thanks! Follow-up by: Gweneth Dimitri RN,  December 05, 2009 11:18 AM  Additional Follow-up for Phone Call Additional follow up Details #1::        per SN: ok for soma 350mg  #90, 1 by mouth three times a day as needed muscle spasm.  called spoke with patient, advised of SN's recs as stated above.  pt verbalized his understanding.  rx sent to pt's verified pharmacy. Boone Master CNA/MA  December 05, 2009 4:03 PM     New/Updated Medications: CARISOPRODOL 350 MG TABS (CARISOPRODOL) Take 1 tablet by mouth three times a day as needed muscle spasm Prescriptions: CARISOPRODOL 350 MG TABS (CARISOPRODOL) Take 1 tablet by mouth three times a day as needed muscle spasm  #90 x 5   Entered by:   Boone Master CNA/MA   Authorized by:   Michele Mcalpine MD   Signed by:   Boone Master CNA/MA on 12/05/2009   Method used:   [facility]        [facility]  [address] [phone]* (retail)       [address]       [place], [place]  [address]       Ph: [phone]       Fax: [phone]   RxID:   [id number]

## 2010-03-06 NOTE — Medication Information (Signed)
Summary: rov coumadin - [facility]  Anticoagulant Therapy  Managed by: Cloyde Reams, RN, BSN Referring MD: Kriste Basque PCP: Merry Proud MD: Gala Romney MD, Reuel Boom Indication 1: DVT Lab Used: [facility] [place] Site: [address] INR POC 4.0 INR RANGE 2.0-3.0  Dietary changes: no    Health status changes: no    Bleeding/hemorrhagic complications: no    Recent/future hospitalizations: no    Any changes in medication regimen? no    Recent/future dental: no  Any missed doses?: no       Is patient compliant with meds? yes       Allergies: No Known Drug Allergies  Anticoagulation Management History:      The patient is taking warfarin and comes in today for a routine follow up visit.  Positive risk factors for bleeding include an age of 72 years or older.  The bleeding index is 'intermediate risk'.  Negative CHADS2 values include Age > 72 years old.  His last INR was 1.0 ratio.  Anticoagulation responsible provider: Desiree Daise MD, Reuel Boom.  INR POC: 4.0.  Cuvette Lot#: [id number].  Exp: 01/2011.    Anticoagulation Management Assessment/Plan:      The patient's current anticoagulation dose is Warfarin sodium 5 mg tabs: Take as directed by coumadin clinic..  The target INR is 2.0-3.0.  The next INR is due 12/14/2009.  Anticoagulation instructions were given to patient.  Results were reviewed/authorized by Cloyde Reams, RN, BSN.  He was notified by Cloyde Reams RN.         Prior Anticoagulation Instructions: INR 3.2  Coumadin 7.5mg   =1 and 1/2 tab each day EXCEPT  1 tab = 5mg  on Monday  Current Anticoagulation Instructions: INR 4.0  Skip tomorrow's dosage of Coumadin, then start taking 1.5 tablets daily except 1 tablet on Sundays, Tuesdays, and Thursdays.  Recheck in 2 weeks.

## 2010-03-06 NOTE — Progress Notes (Signed)
Summary: back pain-rx/ referral  Phone Note Call from Patient Call back at Home Phone [phone]   Caller: Daughter-connie brown Call For: nadel Complaint: Urinary/GYN Problems Summary of Call: pt c/o back pain. requests another rx for this. (says sn gave him something for this 3 wks ago. also has made an appt w/ dr Iran Sizer for 12/7 (re: back) but needs a referral in order for his ins. to pay. dr stevenson's office is [phone]. [facility] [address]  Initial call taken by: Tivis Ringer, CNA,  December 04, 2009 3:00 PM  Follow-up for Phone Call        Pt c/o back pain and states Dr. Kriste Basque gave him a prescription for this at last OV but pt states he lost it and is requesting an new rx for tramadol. Pt also states that he has an appt with Dr. Elisabeth Most for back on 01/10/10 but he needs a referral snt to them so insurance will pay. Please advise onmedication and referral. Carron Curie CMA  December 04, 2009 4:24 PM   Additional Follow-up for Phone Call Additional follow up Details #1::        per SN---ok for pt to have tramadol 50mg   #100  1 by mouth every 6 hours as needed for pain and ok for pt to have the referral for Dr. Ace Gins kind of doctor is he??? Dr. Elisabeth Most is a brain and spine specialist---called and spoke with pt and he is aware that the tramadol has been sent to his pharmacy---and he will call tomorrow to make sure the order has been sent over for this Randell Loop [facility]  December 04, 2009 5:22 PM     Prescriptions: TRAMADOL HCL 50 MG TABS (TRAMADOL HCL) take one tablet by mouth every 6 hours as needed for pain  #100 x 1   Entered by:   Randell Loop CMA   Authorized by:   Michele Mcalpine MD   Signed by:   Randell Loop CMA on 12/04/2009   Method used:   [facility]        [facility]  [address] [phone]* (retail)       [address]       [place], [place]  [address]       Ph: [phone]       Fax: [phone]   RxID:    [id number]

## 2010-03-06 NOTE — Progress Notes (Signed)
Summary: COUMADIN / BP MEDS  Phone Note Call from Patient   Caller: Patient Call For: Kailer Heindel Summary of Call: PT WANTS TO KNOW HOW LONG HE MUST STAY ON COUMADIN. ALSO STATES THAT HE FEELS "WEAK"- "THINKS HIS BP MEDS ARE TOO STRONG". (HE DIDN'T KNOW WHAT HIS BP WAS TODAY). CALL CELL # [phone] Initial call taken by: Tivis Ringer, CNA,  December 26, 2009 4:16 PM  Follow-up for Phone Call        Called and spoke with pt.  He wants to know when he will be able to come off of the coumadin.  He also states that ever sine last hospital d/c, he feels tired and weak all the time," walks around like a drunk person".  He wants to know if this could be related to his BP med- cardizem.  Pls advise, thanks! Follow-up by: Vernie Murders,  December 26, 2009 4:25 PM  Additional Follow-up for Phone Call Additional follow up Details #1::        called and spoke with pt and he is aware to cont the coumadin per SN---appt made for pt on 03-28-10 at 12 to discuss the coumadin---pt wanted to know about his BP meds--says he feels drunk all the time and wonders if they could be too strong--please advise. thanks---pt on diltiazem hcl cr 180mg  once daily. 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  called and spoke with pt and he is aware of SN recs. Randell Loop CMA  December 27, 2009 1:21 PM   Prescriptions: WARFARIN SODIUM 5 MG TABS (WARFARIN SODIUM) Take as directed by coumadin clinic.  #45 x 2   Entered by:   Randell Loop CMA   Authorized by:   Michele Mcalpine MD   Signed by:   Randell Loop CMA on 12/27/2009   Method used:   [facility]ronically to        [facility]  [address] [phone]* (retail)       [address]       [place], [place]  [address]       Ph:  [phone]       Fax: [phone]   RxID:   [id number]

## 2010-03-06 NOTE — Assessment & Plan Note (Signed)
Summary: 6 weeks/ mbw   Primary Care Provider:  Kriste Basque  CC:  70 year ROV & post hosp visit....  History of Present Illness: 70 y/o Kurt [name] here for a follow up visit... he has multiple medical problems as noted below...     ~  Nov09:  his CC is that of fatigue, poor appetite and weight loss (only 3# in 18months documented)... he also c/o urinary frequency & nocturia, and not resting well... 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 he still smokes cigarettes according to his daughter Kurt KitchenI quit 2 weeks ago"), and drinks alcohol (mostly beer) daily...   ~  October 31, 2009:  he was hosp 5/11 by [facility] w/ Etoh withdrawal & DTs, bilat pneumonia w/ resp insuffic, AFib, DVT, & Prevotella bacteremia (seen by ID & Rx w/ Invanz IV... disch to NHP & eventually back home in July after he finished his Antibiotic & Lovenox transitioned to Coumadin (followed in the CC)... he notes "weak", sour taste in mouth, increased urination, back pain, etc... he states he has quit drinking & smoking... we will Rx w/ Tramadol for pain... extensive records from [facility] & f/u have been reviewed w/ pt.    Current Problem List:  COPD (ICD-496),  CIGARETTE SMOKER (ICD-305.1),  Hx of ABNORMAL CHEST XRAY (ICD-793.1) - former smoker 1/2 to 1 ppd x yrs, but says he quit 5/11 illness... hx + for cough & whitish sputum, w/ congestion and chr stable DOE...  denies hemoptysis, worsening dyspnea, wheezing, chest pains, snoring, daytime hypersomnolence, etc... not currently on any resp meds>  ~  Baseline CXR shows COPD, chr interstitial markings, NAD.Kurt Kurt [name]. (no prev CT Chest).  ~  Prev PFT's 3/05 showed FVC= 3.58 (79%), FEV1= 3.02 (96%), %1sec=84, mid-flows= 139%pred... lung volumes were normal and DLCO was WNL as well...  ~  [facility] 5/11 w/ Etoh & bilat pneumonia w/ resp insuffic...  PAROXYSMAL ATRIAL FIBRILLATION (ICD-427.31) - he had AFib in hosp 5/11 & converted to NSR on Cardizem... currently stable on DILTIAZEM CD  180mg /d...  PERIPHERAL VASCULAR DISEASE (ICD-443.9) - on ASA 81mg /d...   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   DVT (ICD-453.40) - venous dopplers pos for bilat DVT in hosp 5/11> not felt to be a good Coumadin candidate at the time & started on Lovenox, disch to NHP & eventually converted to COUMADIN- followed in the Coumadin Clinic now...  HYPERCHOLESTEROLEMIA (ICD-272.0) - on diet alone...  ~  FLP 6/07 showed TChol 219, TG 56, HDL 80, LDL 135  ~  FLP 10/10 showed TChol 197, TG 58, HDL 73, LDL 112  GERD (ICD-530.81) - on OTC Pepcid/ Zantac Prn... EGD in 1998 showed mild esophagitis...  DIVERTICULOSIS OF COLON (ICD-562.10),  COLONIC POLYPS (ICD-211.3) - last colonoscopy 7/05 by DrStark showed divertics & 5mm polyp= tubular adenoma... f/u planned 5 yrs.  ALCOHOL ABUSE (ICD-305.00) - admits to daily beer consumption... intermittent mild incr SGOT in past... adm to hosp 5/11 e/ Etoh withdrawal & DTs> pt states no Etoh since then... he does not voice understanding of alcoholism, & hasn't joined AA, etc...  URINARY FREQUENCY (ICD-788.41) - on [place] supplement... he c/o freq and nocturia- no burning, no blood, mild LTOS...  ~  labs 5/08 showed PSA= 0.70...  ~  labs 10/10 showed PSA= 0.51  FATIGUE (ICD-780.79) - on MVI + Vit D 1000/d per daughter... this has been a chronic issue over the yrs...  NECK PAIN (ICD-723.1) -  known cervical spondylosis w/ eval from DrRobinson in 90's and DrStern over the last 10 yrs...  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 last seen by DrStern 7/07 w/ multilevel lumbar DDD w/ foraminal stenosis and spinal stenosis at each level... currently not c/o much back pain, uses Tylenol Prn, no leg symptoms...  ANXIETY (ICD-300.00) - not currently on meds...  INSOMNIA, CHRONIC (ICD-307.42)   Allergies (verified): No Known Drug  Allergies  Comments:  Nurse/Medical Assistant: The patient's medications and allergies were reviewed with the patient and were updated in the Medication and Allergy Lists.  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 S/P right CTS release by DrKuzma 12/00  Family History: Reviewed history from 11/15/2008 and no changes required. Father died at age 9 w/ prostate cancer Mother died at age 58 from old age  Social History: Reviewed history from 08/17/2009 and no changes required. Married 1 Daughter former smoker, quit july 2011 - 2 ppd x79yrs + Etoh - beer daily none since 5/11.  Retired from [employer]  Review of Systems      See HPI       The patient complains of dyspnea on exertion and muscle weakness.  The patient denies anorexia, fever, weight loss, weight gain, vision loss, decreased hearing, hoarseness, chest pain, syncope, peripheral edema, prolonged cough, headaches, hemoptysis, abdominal pain, melena, hematochezia, severe indigestion/heartburn, hematuria, incontinence, suspicious skin lesions, transient blindness, difficulty walking, depression, unusual weight change, abnormal bleeding, enlarged lymph nodes, and angioedema.    Vital Signs:  Patient profile:   70 year old male Weight:      174.50 pounds O2 Sat:      100 % on Room air Temp:     98 degrees F  oral Pulse rate:   70 / minute BP sitting:   120 / 78  (left arm) Cuff size:   regular  Vitals Entered By: Abigail Miyamoto RN (October 31, 2009 2:24 PM)  O2 Flow:  Room air  Physical Exam  Additional Exam:  WD, WN, Kurt Kurt [name] in NAD... GENERAL:  Alert & oriented; pleasant & cooperative... HEENT:  Simms/AT, EOM-wnl, PERRLA, EACs-clear, TMs-wnl, NOSE-clear, THROAT-clear & wnl. NECK:  Supple w/ fairROM; no JVD; normal carotid impulses w/o bruits; no thyromegaly or nodules palpated; no lymphadenopathy. CHEST:  Clear to P & A; without wheezes/ rales/ or rhonchi heard... HEART:  Regular Rhythm; without murmurs/ rubs/ or gallops detected... ABDOMEN:  Soft & nontender; normal bowel sounds; no organomegaly or masses palpated... EXT: without deformities, mild arthritic changes; no varicose veins/ +venous insuffic/ no edema. BACK:  scar of prev lumbar laminectomies... SLR is neg... NEURO:  CN's intact; motor testing normal; sensory testing normal; gait normal & balance OK. DERM:  No lesions noted; no rash etc...    MISC. Report  Procedure date:  10/31/2009  Findings:      DATA REVIEWED:  ~  extensive old records reviewed from recent hosp 5/24 - 07/11/09...    Impression & Recommendations:  Problem # 1:  COPD (ICD-496) Ex-smoker, hx bilat pneumonia & resp insuffic>  states he's quit smoking, denies breathing problem...  Problem # 2:  DVT (ICD-453.40) Currently on Coumadin via CC...  Problem # 3:  HYPERCHOLESTEROLEMIA (ICD-272.0) On diet alone...  Problem # 4:  GERD (ICD-530.81) GI stable>  uses OTC H2 blockers Prn...  Problem # 5:  COLONIC POLYPS (ICD-211.3) He needs f/u DrStark for f/u colonoscopy...  Problem # 6:  ALCOHOL ABUSE (ICD-305.00) He swears no Etoh since his hosp>  advised to reconsider AA etc...  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  Problem # 8:  OTHER MEDICAL PROBLEMS AS NOTED>>> He  declines Flu shot...  Complete Medication List: 1)  Warfarin Sodium 5 Mg Tabs (Warfarin sodium) .... Take as directed by coumadin clinic. 2)  Diltiazem Hcl Cr 180 Mg Xr24h-cap (Diltiazem hcl) .... Take 1 capsule by mouth once a day 3)  Mens Multivitamin Plus Tabs (Multiple vitamins-minerals) .... Take 1 tab by mouth once daily.Kurt KitchenMarland Kurt [name] 4)  Tramadol Hcl 50 Mg Tabs (Tramadol hcl) .... Take one tablet by mouth every 6 hours as needed for pain  Patient Instructions: 1)  Today we updated your med list- see below.... 2)  We refilled your heart pill... 3)  Conmtinue the Coumadin as directed by the Coumadin Clinic.Kurt KitchenMarland Kurt [name] 4)  Take a good men's formula multivitamin daily.Kurt KitchenMarland Kurt [name] 5)  Gradualy increase your activity & exercise program... 6)  Congrats on quitting smoking & the alcohol!!! 7)  Let's plan a follow up visit in 4-6 months w/ FASTING blood work & CXR at that time... Prescriptions: TRAMADOL HCL 50 MG TABS (TRAMADOL HCL) take one tablet by mouth every 6 hours as needed for pain  #50 x 5   Entered by:   Randell Loop CMA   Authorized by:   Michele Mcalpine MD   Signed by:   Randell Loop CMA on 10/31/2009   Method used:   Print then Give to Patient   RxID:   [id number] DILTIAZEM HCL CR 180 MG XR24H-CAP (DILTIAZEM HCL) Take 1 capsule by mouth once a day  #30 x 12   Entered and Authorized by:   Michele Mcalpine MD   Signed by:   Michele Mcalpine MD on 10/31/2009   Method used:   Print then Give to Patient   RxID:   [id number]

## 2010-03-06 NOTE — Letter (Signed)
Summary: [facility]  [facility]   Imported By: Lester [place] 09/11/2009 09:36:03  _____________________________________________________________________  External Attachment:    Type:   Image     Comment:   External Document

## 2010-03-06 NOTE — Progress Notes (Signed)
Summary: prescript  Phone Note Call from Patient Call back at [phone]   Caller: Daughter Kurt [name] Call For: nadel Summary of Call: have questions about enoxaparin sodium Initial call taken by: Rickard Patience,  August 16, 2009 11:22 AM  Follow-up for Phone Call        called and spoke with pts daughter Kurt [name]---she is aware that pt has not seen SN since 2009  since pt does not like coming to the doctor.  he was just d/cd from the hospital for dvt and they had him on shots daily but today he takes his last one.  he has HFU with TP on tuesday at 10.  pts daughter is aware and will call for any other problems. Randell Loop [facility]  August 16, 2009 11:32 AM

## 2010-03-06 NOTE — Medication Information (Signed)
Summary: rov/tm  Anticoagulant Therapy  Managed by: Bethena Midget, RN, BSN Referring MD: Kriste Basque PCP: Merry Proud MD: Clifton James MD, Cristal Deer Indication 1: DVT Lab Used: [facility] [place] Site: [address] INR POC 2.0 INR RANGE 2.0-3.0  Dietary changes: no    Health status changes: no    Bleeding/hemorrhagic complications: no    Recent/future hospitalizations: no    Any changes in medication regimen? no    Recent/future dental: no  Any missed doses?: no       Is patient compliant with meds? yes       Allergies: No Known Drug Allergies  Anticoagulation Management History:      The patient is taking warfarin and comes in today for a routine follow up visit.  Positive risk factors for bleeding include an age of 36 years or older.  The bleeding index is 'intermediate risk'.  Negative CHADS2 values include Age > 74 years old.  His last INR was 1.0 ratio.  Anticoagulation responsible provider: Clifton James MD, Cristal Deer.  INR POC: 2.0.  Cuvette Lot#: [id number].  Exp: 01/2011.    Anticoagulation Management Assessment/Plan:      The patient's current anticoagulation dose is Warfarin sodium 5 mg tabs: Take as directed by coumadin clinic..  The target INR is 2.0-3.0.  The next INR is due 01/10/2010.  Anticoagulation instructions were given to patient.  Results were reviewed/authorized by Bethena Midget, RN, BSN.  He was notified by Bethena Midget, RN, BSN.         Prior Anticoagulation Instructions: INR 1.4  Tomorrow, Friday, November 11th, take Coumadin 2.5 tabs (12.5 mg). Then, resume taking Coumadin 1 tab (5 mg) on Sun, Tues, Thur and Coumadin 1.5 tabs (7.5 mg) on Mon, Wed, Fri, Sat.  Return to clinic in 10 days.   Current Anticoagulation Instructions: INR 2.0 Continue  1 1/2 pill everyday except 1 pill on Sundays, Tuesdays and Thursdays. Recheck in 2 weeks.

## 2010-03-06 NOTE — Medication Information (Signed)
Summary: rov/sp  Anticoagulant Therapy  Managed by: Cloyde Reams, RN, BSN Referring MD: Kriste Basque PCP: Merry Proud MD: Daleen Squibb MD, Maisie Fus Indication 1: DVT Lab Used: [facility] [place] Site: [address] INR POC 2.0 INR RANGE 2.0-3.0           Allergies: No Known Drug Allergies  Anticoagulation Management History:      The patient is taking warfarin and comes in today for a routine follow up visit.  Positive risk factors for bleeding include an age of 72 years or older.  The bleeding index is 'intermediate risk'.  Negative CHADS2 values include Age > 5 years old.  His last INR was 1.0 ratio.  Anticoagulation responsible provider: Daleen Squibb MD, Maisie Fus.  INR POC: 2.0.  Cuvette Lot#: [id number].  Exp: 11/2010.    Anticoagulation Management Assessment/Plan:      The patient's current anticoagulation dose is Warfarin sodium 5 mg tabs: Take as directed by coumadin clinic..  The target INR is 2.0-3.0.  The next INR is due 09/01/2009.  Anticoagulation instructions were given to patient.  Results were reviewed/authorized by Cloyde Reams, RN, BSN.  He was notified by Cloyde Reams RN.         Prior Anticoagulation Instructions: INR 1.1   Take 2 tablets today and tomorrow then start 1 1/2 tablets daily on Wednesday.  Continue Lovenox injections.   Current Anticoagulation Instructions: INR 2.0  Continue on same dosage 1.5 tablets daily.  Recheck in 1 weeks.

## 2010-03-06 NOTE — Progress Notes (Signed)
Summary: question re medication  Phone Note Call from Patient Message from:  Patient on September 11, 2009 11:31 AM  Reason for Call: Talk to Nurse Summary of Call: pt wants to know if he needs to continue taking folic acid, vit b 1, and diltiazem pls call [phone] if so needs [facility] [address] Initial call taken by: Glynda Jaeger,  September 11, 2009 11:32 AM  Follow-up for Phone Call        spoke with pt, he is follow by dr Kriste Basque. will foward for their review. Deliah Goody, RN  September 11, 2009 4:01 PM  these meds were refilled and electronically sent to pt's pharmacy per Randell Loop  Follow-up by: Philipp Deputy CMA,  September 13, 2009 10:22 AM

## 2010-03-06 NOTE — Miscellaneous (Signed)
Summary: Plan/[facility]  Plan/[facility]   Imported By: Lester Ramey 09/07/2009 09:45:13  _____________________________________________________________________  External Attachment:    Type:   Image     Comment:   External Document

## 2010-03-06 NOTE — Progress Notes (Signed)
Summary: medication  Phone Note Call from Patient Call back at [phone]   Caller: Daughter connie Call For: nadel Summary of Call: daughter have questions about pt's medication Initial call taken by: Rickard Patience,  August 17, 2009 9:47 AM  Follow-up for Phone Call        called and spoke with pt's daughter.  daughter states pt has pending appt with TP for 08-22-2009 but daughter would like pt seen sooner.  Scheduled pt to see TP today at 11:30am.

## 2010-03-06 NOTE — Progress Notes (Signed)
Summary: Coumadin start  Phone Note Outgoing Call   Call placed by: Cloyde Reams RN,  August 18, 2009 9:14 AM Call placed to: Patient/daughter Junious Dresser Summary of Call: Called pt, and pt's daughter Junious Dresser, advised to start Coumadin 5mg  tablets once daily today.  Advised pt and daughter to continue on Lovenox injections as well for now.  Advised of appt with [facility] on 08/21/09 at 2:30pm to check INR and adjust coumadin dosage if needed.  Cautioned against using ETOH while on blood thinners and warned of incr risk of bleeding assoc with.  Advised to monitor for s+s of bleeding and call or seek medical attention if occurs.  Pt's daughter Junious Dresser, expressed understanding. Initial call taken by: Cloyde Reams RN,  August 18, 2009 9:18 AM     Appended Document: Coumadin start    Clinical Lists Changes  Medications: Added new medication of WARFARIN SODIUM 5 MG TABS (WARFARIN SODIUM) Take as directed by coumadin clinic. - Signed Rx of WARFARIN SODIUM 5 MG TABS (WARFARIN SODIUM) Take as directed by coumadin clinic.;  #45 x 1;  Signed;  Entered by: Cloyde Reams RN;  Authorized by: Michele Mcalpine MD;  Method used: [id number] [address], [place], [place], [place]  [address], Ph: [phone], Fax: [phone]    Prescriptions: WARFARIN SODIUM 5 MG TABS (WARFARIN SODIUM) Take as directed by coumadin clinic.  #45 x 1   Entered by:   Cloyde Reams RN   Authorized by:   Michele Mcalpine MD   Signed by:   Cloyde Reams RN on 08/18/2009   Method used:   [facility]        [facility]  [address] [id number]       [address]       [place], [place]  [address]       Ph: [phone]       Fax: [phone]   RxID:   [id number]

## 2010-03-06 NOTE — Medication Information (Signed)
Summary: rov/ewj  Anticoagulant Therapy  Managed by: Weston Brass, PharmD Referring MD: Kriste Basque PCP: Merry Proud MD: Jens Som MD, Arlys John Indication 1: DVT Lab Used: [facility] [place] Site: [address] INR POC 2.3 INR RANGE 2.0-3.0  Dietary changes: no    Health status changes: no    Bleeding/hemorrhagic complications: no    Recent/future hospitalizations: no    Any changes in medication regimen? no    Recent/future dental: no  Any missed doses?: no       Is patient compliant with meds? yes       Allergies: No Known Drug Allergies  Anticoagulation Management History:      The patient is taking warfarin and comes in today for a routine follow up visit.  Positive risk factors for bleeding include an age of 72 years or older.  The bleeding index is 'intermediate risk'.  Negative CHADS2 values include Age > 50 years old.  His last INR was 1.0 ratio.  Anticoagulation responsible provider: Jens Som MD, Arlys John.  INR POC: 2.3.  Exp: 11/2010.    Anticoagulation Management Assessment/Plan:      The patient's current anticoagulation dose is Warfarin sodium 5 mg tabs: Take as directed by coumadin clinic..  The target INR is 2.0-3.0.  The next INR is due 09/13/2009.  Anticoagulation instructions were given to patient.  Results were reviewed/authorized by Weston Brass, PharmD.  He was notified by Weston Brass PharmD.         Prior Anticoagulation Instructions: INR 2.0  Continue on same dosage 1.5 tablets daily.  Recheck in 1 weeks.    Current Anticoagulation Instructions: INR 2.3  Continue same dose of 1 1/2 tablets every day.  Recheck in 10 days.

## 2010-03-06 NOTE — Progress Notes (Signed)
Summary: wants to see a back dr  Phone Note Call from Patient Call back at [phone]   Caller: Patient Call For: nadel Summary of Call: wants to be referred to dr fryer on [address] for his back Initial call taken by: Lacinda Axon,  December 06, 2009 12:34 PM  Follow-up for Phone Call        Pt was calling to see if referral had been sent to Dr. Elisabeth Most. I advisd referral sent. pt aware.Carron Curie CMA  December 06, 2009 2:58 PM

## 2010-03-07 ENCOUNTER — Encounter (INDEPENDENT_AMBULATORY_CARE_PROVIDER_SITE_OTHER): Payer: MEDICARE

## 2010-03-07 ENCOUNTER — Encounter: Payer: Self-pay | Admitting: Internal Medicine

## 2010-03-07 ENCOUNTER — Ambulatory Visit: Admit: 2010-03-07 | Payer: Self-pay

## 2010-03-07 DIAGNOSIS — Z7901 Long term (current) use of anticoagulants: Secondary | ICD-10-CM

## 2010-03-07 DIAGNOSIS — I80299 Phlebitis and thrombophlebitis of other deep vessels of unspecified lower extremity: Secondary | ICD-10-CM

## 2010-03-07 LAB — CONVERTED CEMR LAB: POC INR: 1.7

## 2010-03-08 NOTE — Letter (Signed)
Summary: [facility]  [facility]   Imported By: Sherian Rein 02/16/2010 14:21:34  _____________________________________________________________________  External Attachment:    Type:   Image     Comment:   External Document

## 2010-03-08 NOTE — Medication Information (Signed)
Summary: [facility] coumadin - [facility]  Anticoagulant Therapy  Managed by: Leota Sauers, PharmD, BCPS, CPP Referring MD: Kriste Basque PCP: Merry Proud MD: Ladona Ridgel MD, Sharlot Gowda Indication 1: DVT Lab Used: [facility] [place] Site: [address] INR POC 1.7 INR RANGE 2.0-3.0  Dietary changes: no    Health status changes: no    Bleeding/hemorrhagic complications: no    Recent/future hospitalizations: no    Any changes in medication regimen? no    Recent/future dental: no  Any missed doses?: no       Is patient compliant with meds? yes       Current Medications (verified): 1)  Warfarin Sodium 5 Mg Tabs (Warfarin Sodium) .... Take As Directed By Coumadin Clinic. 2)  Diltiazem Hcl Cr 180 Mg Xr24h-Cap (Diltiazem Hcl) .... Take 1 Capsule By Mouth Once A Day 3)  Mens Multivitamin Plus  Tabs (Multiple Vitamins-Minerals) .... Take 1 Tab By Mouth Once Daily.Marland KitchenMarland Kitchen 4)  Tramadol Hcl 50 Mg Tabs (Tramadol Hcl) .... Take One Tablet By Mouth Every 6 Hours As Needed For Pain 5)  Folic Acid 1 Mg Tabs (Folic Acid) .... Take One Tablet By Mouth Once Daily 6)  Carisoprodol 350 Mg Tabs (Carisoprodol) .... Take 1 Tablet By Mouth Three Times A Day As Needed Muscle Spasm  Allergies (verified): No Known Drug Allergies  Anticoagulation Management History:      The patient is taking warfarin and comes in today for a routine follow up visit.  Positive risk factors for bleeding include an age of 72 years or older.  The bleeding index is 'intermediate risk'.  Negative CHADS2 values include Age > 72 years old.  His last INR was 1.0 ratio.  Anticoagulation responsible provider: Ladona Ridgel MD, Sharlot Gowda.  INR POC: 1.7.  Cuvette Lot#: [id number].  Exp: 01/2011.    Anticoagulation Management Assessment/Plan:      The patient's current anticoagulation dose is Warfarin sodium 5 mg tabs: Take as directed by coumadin clinic..  The target INR is 2.0-3.0.  The next INR is due 03/07/2010.  Anticoagulation instructions were given  to patient.  Results were reviewed/authorized by Leota Sauers, PharmD, BCPS, CPP.         Prior Anticoagulation Instructions: INR 2.0  Coumadin 5mg  tab - take 1.5 tabs on MON, WED, FRI, SAT take 1 tab TUE, THUR, SUN  Current Anticoagulation Instructions: INR 1.7  Coumadin 5mg  tabs  - TAKE EXTRA 1/2 tab today   then 1 tab TUE, THUR, SUN 1.5 tab all othe days

## 2010-03-14 NOTE — Medication Information (Signed)
Summary: [name]  Anticoagulant Therapy  Managed by: Bethena Midget, RN, BSN Referring MD: Kriste Basque PCP: Merry Proud MD: Ladona Ridgel MD, Sharlot Gowda Indication 1: DVT Lab Used: [facility]  Site: [address] INR POC 1.7 INR RANGE 2.0-3.0  Dietary changes: no    Health status changes: yes       Details: Lower back pain that travels down back of leg down to ant. lower leg  Bleeding/hemorrhagic complications: no    Recent/future hospitalizations: no    Any changes in medication regimen? yes       Details: on new med for arthritis started 2-3 weeks ago by back specialist  Recent/future dental: no  Any missed doses?: no       Is patient compliant with meds? yes       Allergies: No Known Drug Allergies  Anticoagulation Management History:      The patient is taking warfarin and comes in today for a routine follow up visit.  Positive risk factors for bleeding include an age of 72 years or older.  The bleeding index is 'intermediate risk'.  Negative CHADS2 values include Age > 72 years old.  His last INR was 1.0 ratio.  Anticoagulation responsible provider: Ladona Ridgel MD, Sharlot Gowda.  INR POC: 1.7.  Cuvette Lot#: [id number].  Exp: 02/2011.    Anticoagulation Management Assessment/Plan:      The patient's current anticoagulation dose is Warfarin sodium 5 mg tabs: Take as directed by coumadin clinic..  The target INR is 2.0-3.0.  The next INR is due 03/21/2010.  Anticoagulation instructions were given to patient.  Results were reviewed/authorized by Bethena Midget, RN, BSN.  He was notified by Bethena Midget, RN, BSN.         Prior Anticoagulation Instructions: INR 1.7  Coumadin 5mg  tabs  - TAKE EXTRA 1/2 tab today   then 1 tab TUE, THUR, SUN 1.5 tab all othe days  Current Anticoagulation Instructions: INR 1.7 Today take extra 1/2 pill then change dose to 1.5 pills everyday except 1 pill on Tuesdays and Thursdays. Recheck in 2 weeks.

## 2010-03-20 DIAGNOSIS — I82409 Acute embolism and thrombosis of unspecified deep veins of unspecified lower extremity: Secondary | ICD-10-CM

## 2010-03-20 DIAGNOSIS — I4891 Unspecified atrial fibrillation: Secondary | ICD-10-CM

## 2010-03-21 ENCOUNTER — Encounter (INDEPENDENT_AMBULATORY_CARE_PROVIDER_SITE_OTHER): Payer: MEDICARE

## 2010-03-21 ENCOUNTER — Encounter: Payer: Self-pay | Admitting: Internal Medicine

## 2010-03-21 DIAGNOSIS — Z7901 Long term (current) use of anticoagulants: Secondary | ICD-10-CM

## 2010-03-21 DIAGNOSIS — I8010 Phlebitis and thrombophlebitis of unspecified femoral vein: Secondary | ICD-10-CM

## 2010-03-28 ENCOUNTER — Ambulatory Visit: Payer: Self-pay | Admitting: Pulmonary Disease

## 2010-03-28 NOTE — Medication Information (Signed)
Summary: Coumadin Clinic  Anticoagulant Therapy  Managed by: Cloyde Reams, RN, BSN Referring MD: Kriste Basque PCP: Merry Proud MD: Gala Romney MD, Reuel Boom Indication 1: DVT Lab Used: [facility] [place] Site: [address] INR POC 1.7 INR RANGE 2.0-3.0  Dietary changes: no    Health status changes: no    Bleeding/hemorrhagic complications: no    Recent/future hospitalizations: no    Any changes in medication regimen? no    Recent/future dental: no  Any missed doses?: no       Is patient compliant with meds? yes       Allergies: No Known Drug Allergies  Anticoagulation Management History:      The patient is taking warfarin and comes in today for a routine follow up visit.  Positive risk factors for bleeding include an age of 72 years or older.  The bleeding index is 'intermediate risk'.  Negative CHADS2 values include Age > 74 years old.  His last INR was 1.0 ratio.  Anticoagulation responsible provider: Abrahm Mancia MD, Reuel Boom.  INR POC: 1.7.  Cuvette Lot#: [id number].  Exp: 02/2011.    Anticoagulation Management Assessment/Plan:      The patient's current anticoagulation dose is Warfarin sodium 5 mg tabs: Take as directed by coumadin clinic..  The target INR is 2.0-3.0.  The next INR is due 04/11/2010.  Anticoagulation instructions were given to patient.  Results were reviewed/authorized by Cloyde Reams, RN, BSN.  He was notified by Cloyde Reams RN.         Prior Anticoagulation Instructions: INR 1.7 Today take extra 1/2 pill then change dose to 1.5 pills everyday except 1 pill on Tuesdays and Thursdays. Recheck in 2 weeks.   Current Anticoagulation Instructions: INR 1.7  Take an extra 1/2 tablet today, then start taking 1.5 tablets daily except 1 tablet on Tuesdays.  Recheck in 3 weeks.

## 2010-03-29 ENCOUNTER — Encounter: Payer: Self-pay | Admitting: Pulmonary Disease

## 2010-03-29 ENCOUNTER — Other Ambulatory Visit: Payer: Self-pay | Admitting: Pulmonary Disease

## 2010-03-29 ENCOUNTER — Other Ambulatory Visit: Payer: MEDICARE

## 2010-03-29 ENCOUNTER — Ambulatory Visit (INDEPENDENT_AMBULATORY_CARE_PROVIDER_SITE_OTHER): Payer: MEDICARE | Admitting: Pulmonary Disease

## 2010-03-29 ENCOUNTER — Ambulatory Visit (INDEPENDENT_AMBULATORY_CARE_PROVIDER_SITE_OTHER)
Admission: RE | Admit: 2010-03-29 | Discharge: 2010-03-29 | Disposition: A | Discharge status: Home / Self Care | Payer: MEDICARE | Source: Ambulatory Visit | Attending: Pulmonary Disease | Admitting: Pulmonary Disease

## 2010-03-29 DIAGNOSIS — J189 Pneumonia, unspecified organism: Secondary | ICD-10-CM

## 2010-03-29 DIAGNOSIS — K219 Gastro-esophageal reflux disease without esophagitis: Secondary | ICD-10-CM

## 2010-03-29 DIAGNOSIS — E7800 Pure hypercholesterolemia, unspecified: Secondary | ICD-10-CM

## 2010-03-29 DIAGNOSIS — R911 Solitary pulmonary nodule: Secondary | ICD-10-CM

## 2010-03-29 DIAGNOSIS — F17200 Nicotine dependence, unspecified, uncomplicated: Secondary | ICD-10-CM

## 2010-03-29 DIAGNOSIS — I739 Peripheral vascular disease, unspecified: Secondary | ICD-10-CM

## 2010-03-29 DIAGNOSIS — J449 Chronic obstructive pulmonary disease, unspecified: Secondary | ICD-10-CM

## 2010-03-29 DIAGNOSIS — I4891 Unspecified atrial fibrillation: Secondary | ICD-10-CM

## 2010-03-29 DIAGNOSIS — Z79899 Other long term (current) drug therapy: Secondary | ICD-10-CM

## 2010-03-29 DIAGNOSIS — R5381 Other malaise: Secondary | ICD-10-CM

## 2010-03-29 DIAGNOSIS — R350 Frequency of micturition: Secondary | ICD-10-CM

## 2010-03-29 DIAGNOSIS — F1010 Alcohol abuse, uncomplicated: Secondary | ICD-10-CM

## 2010-03-29 DIAGNOSIS — Z23 Encounter for immunization: Secondary | ICD-10-CM

## 2010-03-29 DIAGNOSIS — R5383 Other fatigue: Secondary | ICD-10-CM

## 2010-03-29 LAB — URINALYSIS, ROUTINE W REFLEX MICROSCOPIC
Bilirubin Urine: NEGATIVE
Leukocytes, UA: NEGATIVE
Nitrite: NEGATIVE
Specific Gravity, Urine: >=1.03 (ref 1.000–1.030)
pH: 6 (ref 5.0–8.0)

## 2010-03-29 LAB — CBC WITH DIFFERENTIAL/PLATELET
Basophils Absolute: 0 10*3/uL (ref 0.0–0.1)
Eosinophils Relative: 1.7 % (ref 0.0–5.0)
Lymphs Abs: 1.5 10*3/uL (ref 0.7–4.0)
MCV: 98.1 fl (ref 78.0–100.0)
Monocytes Absolute: 0.6 10*3/uL (ref 0.1–1.0)
Monocytes Relative: 12.5 % — ABNORMAL HIGH (ref 3.0–12.0)
Neutrophils Relative %: 51.8 % (ref 43.0–77.0)
Platelets: 300 10*3/uL (ref 150.0–400.0)
RDW: 14.6 % (ref 11.5–14.6)
WBC: 4.4 10*3/uL — ABNORMAL LOW (ref 4.5–10.5)

## 2010-03-29 LAB — BASIC METABOLIC PANEL
BUN: 12 mg/dL (ref 6–23)
Chloride: 108 mEq/L (ref 96–112)
Glucose, Bld: 87 mg/dL (ref 70–99)
Potassium: 3.9 mEq/L (ref 3.5–5.1)
Sodium: 142 mEq/L (ref 135–145)

## 2010-03-29 LAB — IBC PANEL: Transferrin: 254.1 mg/dL (ref 212.0–360.0)

## 2010-03-29 LAB — SEDIMENTATION RATE: Sed Rate: 32 mm/hr — ABNORMAL HIGH (ref 0–22)

## 2010-03-29 LAB — HEPATIC FUNCTION PANEL
ALT: 21 U/L (ref 0–53)
AST: 26 U/L (ref 0–37)
Albumin: 3.8 g/dL (ref 3.5–5.2)
Alkaline Phosphatase: 88 U/L (ref 39–117)

## 2010-03-30 LAB — B12 AND FOLATE PANEL: Vitamin B-12: 361 pg/mL (ref 211–911)

## 2010-03-30 LAB — TSH: TSH: 1.41 u[IU]/mL (ref 0.35–5.50)

## 2010-04-04 ENCOUNTER — Encounter: Payer: Self-pay | Admitting: Pulmonary Disease

## 2010-04-11 ENCOUNTER — Encounter (INDEPENDENT_AMBULATORY_CARE_PROVIDER_SITE_OTHER): Payer: Medicare Other

## 2010-04-11 ENCOUNTER — Encounter: Payer: Self-pay | Admitting: Internal Medicine

## 2010-04-11 DIAGNOSIS — I80299 Phlebitis and thrombophlebitis of other deep vessels of unspecified lower extremity: Secondary | ICD-10-CM

## 2010-04-11 DIAGNOSIS — Z7901 Long term (current) use of anticoagulants: Secondary | ICD-10-CM

## 2010-04-17 NOTE — Assessment & Plan Note (Signed)
Summary: 5 month follow up/discuss coumadin/la   Primary Care Provider:  Kriste Basque  CC:  5 month ROV & review of mult medical problems....  History of Present Illness: 70 y/o BM here for a follow up visit... he has multiple medical problems as noted below...     ~  October 31, 2009:  he was hosp 5/11 by [facility] w/ Etoh withdrawal & DTs, bilat pneumonia w/ resp insuffic, AFib, DVT, & Prevotella bacteremia (seen by ID & Rx w/ Invanz IV... disch to [facility] & eventually back home in July after he finished his Antibiotic & Lovenox transitioned to Coumadin (followed in the CC)... he notes "weak", sour taste in mouth, increased urination, back pain, etc... he states he has quit drinking & smoking... we will Rx w/ Tramadol for pain... extensive records from [facility] & f/u have been reviewed w/ pt.   ~  March 29, 2010:  he saw DrStern 12/11 & "I've got a whole lot of arthritis in my back"> note reviewed & he mentioned no neuro deficits & rec for Voltaren Gel for his LBP (hx 2 prev surgeries- Lumbar Lam & disckectomy in 1985 & 1990)...  he states he is not drinking at present but still smoking 1/2 ppd & notes min cough, sputum, drainage, dyspnea> rec Mucinex, Claritin, nasal saline Prn... he is on Coumadin for his prev DVT & PAF> followed in the CC & doing satis w/o CP, palpit, dizzy, syncope, etc... we will f/u CXR (COPD, DJD sp, NAD), and non-fasting labs (all essen WNL x VitD=19 & rec for OTC daily supplement... OK PNEUMOVAX today.  See prev Cent[facility] EMR note for Problem List details>>>  Current Meds:  LORATADINE 10 MG TABS (LORATADINE) take one tablet by mouth once daily WARFARIN SODIUM 5 MG TABS (WARFARIN SODIUM) Take as directed by coumadin clinic. DILTIAZEM HCL CR 180 MG XR24H-CAP (DILTIAZEM HCL) Take 1 capsule by mouth once a day TRAMADOL HCL 50 MG TABS (TRAMADOL HCL) take one tablet by mouth every 6 hours as needed for pain CARISOPRODOL 350 MG TABS (CARISOPRODOL) Take 1 tablet by mouth three times a day as  needed muscle spasm MENS MULTIVITAMIN PLUS  TABS (MULTIPLE VITAMINS-MINERALS) take 1 tab by mouth once daily... FOLIC ACID 1 MG TABS (FOLIC ACID) take one tablet by mouth once daily    Preventive Screening-Counseling & Management  Alcohol-Tobacco     Smoking Status: quit     Year Quit: 08/2009  Comments: smoked x 53 years  Allergies (verified): No Known Drug Allergies  Comments:  Nurse/Medical Assistant: The patient's medications and allergies were reviewed with the patient and were updated in the Medication and Allergy Lists.  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 S/P right CTS release by DrKuzma 12/00  Family History: Reviewed history from 10/31/2009 and no changes required. Father died at age 64 w/ prostate cancer Mother died at age 29 from old age  Social History: Reviewed history from 10/31/2009 and no changes required. Married 1 Daughter former smoker, quit july 2011 - 2 ppd x73yrs + Etoh - beer daily none since 5/11.  Retired from [employer] Smoking  Status:  quit  Review of Systems      See HPI       The patient complains of dyspnea on exertion.  The patient denies anorexia, fever, weight loss, weight gain, vision loss, decreased hearing, hoarseness, chest pain, syncope, peripheral edema, prolonged cough, headaches, hemoptysis, abdominal pain, melena,  hematochezia, severe indigestion/heartburn, hematuria, incontinence, muscle weakness, suspicious skin lesions, transient blindness, difficulty walking, depression, unusual weight change, abnormal bleeding, enlarged lymph nodes, and angioedema.    Vital Signs:  Patient profile:   70 year old male Height:      70 inches Weight:      163.50 pounds BMI:     23.54 O2 Sat:      97 % on room air Temp:     97.4 degrees F oral Pulse rate:   90 / minute BP sitting:   126 / 80  (right arm) Cuff size:   regular  Vitals Entered By: Randell Loop CMA (March 29, 2010 3:19 PM)  O2 Sat at Rest %:  97 O2 Flow:  room air CC: 5 month ROV & review of mult medical problems... Is Patient Diabetic? No Pain Assessment Patient in pain? yes      Onset of pain  back pain Comments meds updated today with pt--pt brought all of his meds in today   Physical Exam  Additional Exam:  WD, WN, 70 y/o BM in NAD... GENERAL:  Alert & oriented; pleasant & cooperative... HEENT:  [place]/AT, EOM-wnl, PERRLA, EACs-clear, TMs-wnl, NOSE-clear, THROAT-clear & wnl. NECK:  Supple w/ fairROM; no JVD; normal carotid impulses w/o bruits; no thyromegaly or nodules palpated; no lymphadenopathy. CHEST:  Clear to P & A; without wheezes/ rales/ or rhonchi heard... HEART:  Regular Rhythm; without murmurs/ rubs/ or gallops detected... ABDOMEN:  Soft & nontender; normal bowel sounds; no organomegaly or masses palpated... EXT: without deformities, mild arthritic changes; no varicose veins/ +venous insuffic/ no edema. BACK:  scar of prev lumbar laminectomies... SLR is neg... NEURO:  CN's intact; motor testing normal; sensory testing normal; gait normal & balance OK. DERM:  No lesions noted; no rash etc...    Impression & Recommendations:  Problem # 1:  COPD (ICD-496) Follow up CXR is OK> COPD, NAD... he needs to wquit all smoking & offered Chantix, discuused nicotine replacement, Ecig, etc (he's not interested in any of  it)... Orders: T-2 View CXR (71020TC)  Problem # 2:  PAROXYSMAL ATRIAL FIBRILLATION (ICD-427.31) He has transient PAF in hosp... no known recurrence... His updated medication list for this problem includes:    Warfarin Sodium 5 Mg Tabs (Warfarin sodium) .Marland Kitchen... Take as directed by coumadin clinic.    Diltiazem Hcl Cr 180 Mg Xr24h-cap (Diltiazem hcl) .Marland Kitchen... 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  Problem # 4:  HYPERCHOLESTEROLEMIA (ICD-272.0) On Diet alone> not fasting for f/u FLP...  Problem # 5:  ALCOHOL ABUSE (ICD-305.00) He states that he's not drinking... encouraged to keep up the good work... Labs show essentially WNL.Marland KitchenMarland Kitchen  Problem # 6:  OTHER MEDICAL PROBLEMS AS NOTED>>> Orders: T-Vitamin D (25-Hydroxy) (43329-51884) TLB-BMP (Basic Metabolic Panel-BMET) (80048-METABOL) TLB-Hepatic/Liver Function Pnl (80076-HEPATIC) TLB-CBC Platelet - w/Differential (85025-CBCD) TLB-TSH (Thyroid Stimulating Hormone) (84443-TSH) TLB-B12 + Folate Pnl (16606_30160-F09/NAT) TLB-IBC Pnl (Iron/FE;Transferrin) (83550-IBC) TLB-PSA (Prostate Specific Antigen) (84153-PSA) TLB-Sedimentation Rate (ESR) (85652-ESR) TLB-Udip w/ Micro (81001-URINE)  Complete Medication List: 1)  Loratadine  10 Mg Tabs (Loratadine) .... Take one tablet by mouth once daily 2)  Warfarin Sodium 5 Mg Tabs (Warfarin sodium) .... Take as directed by coumadin clinic. 3)  Diltiazem Hcl Cr 180 Mg Xr24h-cap (Diltiazem hcl) .... Take 1 capsule by mouth once a day 4)  Tramadol Hcl 50 Mg Tabs (Tramadol hcl) .... Take one tablet by mouth every 6 hours as needed for pain 5)  Carisoprodol 350 Mg Tabs (Carisoprodol) .... Take 1 tablet by mouth three times a day as needed muscle spasm 6)  Mens Multivitamin Plus Tabs (Multiple vitamins-minerals) .... Take 1 tab by mouth once daily.Marland KitchenMarland Kitchen 7)  Folic Acid 1 Mg Tabs (Folic  acid) .... Take one tablet by mouth once daily  Other Orders: Pneumococcal Vaccine (38250) Admin 1st Vaccine (53976)  Patient Instructions: 1)  Today we updated your med list- see below.... 2)  Continue your current meds the same... 3)  For your congestion & mucus:  take MUCINEX 600mg - 2 tabs twice daily w/ plenty of water, and use a SALINE nasal mist to spray in your nose every 1-2 H as needed to keep it moist 7 draining out... 4)  Today we gave you a PNEUMONIA Vaccine...  5)  We also did a follow up CXR & blood work to check poss causes of your weakness... please call the "phone tree" in a few days for your lab results.Marland KitchenMarland Kitchen 6)  Call for any questions...   Immunizations Administered:  Pneumonia Vaccine:    Vaccine Type: Pneumovax (Medicare)    Site: left deltoid    Mfr: Merck    Dose: 0.5 ml    Route: IM    Given by: Reynaldo Minium CMA    Exp. Date: 06/29/2011    Lot #: [id number]    VIS given: 01/09/09 version given March 29, 2010.

## 2010-04-17 NOTE — Medication Information (Signed)
Summary: rov/ewj  Anticoagulant Therapy  Managed by: Cloyde Reams, RN, BSN Referring MD: Kriste Basque PCP: Merry Proud MD: Graciela Husbands MD, Viviann Spare Indication 1: DVT Lab Used: [facility] [place] Site: [address] INR POC 1.7 INR RANGE 2.0-3.0  Dietary changes: no    Health status changes: no    Bleeding/hemorrhagic complications: no    Recent/future hospitalizations: no    Any changes in medication regimen? yes       Details: Started on Methocarbanol 750mg  tid   Recent/future dental: no  Any missed doses?: no       Is patient compliant with meds? yes       Allergies: No Known Drug Allergies   Anticoagulation Management History:      Positive risk factors for bleeding include an age of 28 years or older.  The bleeding index is 'intermediate risk'.  Negative CHADS2 values include Age > 39 years old.  His last INR was 1.0 ratio.  Anticoagulation responsible provider: Graciela Husbands MD, Viviann Spare.  INR POC: 1.7.  Exp: 02/2011.    Anticoagulation Management Assessment/Plan:      The patient's current anticoagulation dose is Warfarin sodium 5 mg tabs: Take as directed by coumadin clinic..  The target INR is 2.0-3.0.  The next INR is due 05/02/2010.  Anticoagulation instructions were given to patient.  Results were reviewed/authorized by Cloyde Reams, RN, BSN.  He was notified by Cloyde Reams RN.         Prior Anticoagulation Instructions: INR 1.7  Take an extra 1/2 tablet today, then start taking 1.5 tablets daily except 1 tablet on Tuesdays.  Recheck in 3 weeks.   Current Anticoagulation Instructions: INR 1.7  Take an extra 1/2 tablet today, then start taking 1.5 tablet everyday. Recheck in 3 weeks.  Prescriptions: WARFARIN SODIUM 5 MG TABS (WARFARIN SODIUM) Take as directed by coumadin clinic.  #50 x 1   Entered by:   Cloyde Reams RN   Authorized by:   Nathen May, MD, [facility]   Signed by:   Cloyde Reams RN on 04/11/2010   Method used:   [id number]       [address]       [place], [place]  [address]       Ph: [phone]       Fax: [phone]   RxID:   [id number]

## 2010-04-23 LAB — MAGNESIUM
Magnesium: 2.1 mg/dL (ref 1.5–2.5)
Magnesium: 2.9 mg/dL — ABNORMAL HIGH (ref 1.5–2.5)

## 2010-04-23 LAB — CBC
HCT: 28.6 % — ABNORMAL LOW (ref 39.0–52.0)
HCT: 34.7 % — ABNORMAL LOW (ref 39.0–52.0)
HCT: 39.4 % (ref 39.0–52.0)
HCT: 42.5 % (ref 39.0–52.0)
HCT: 46 % (ref 39.0–52.0)
Hemoglobin: 9.1 g/dL — ABNORMAL LOW (ref 13.0–17.0)
Hemoglobin: 9.6 g/dL — ABNORMAL LOW (ref 13.0–17.0)
Hemoglobin: 10.4 g/dL — ABNORMAL LOW (ref 13.0–17.0)
Hemoglobin: 11.7 g/dL — ABNORMAL LOW (ref 13.0–17.0)
Hemoglobin: 13.4 g/dL (ref 13.0–17.0)
Hemoglobin: 15.6 g/dL (ref 13.0–17.0)
MCHC: 33.4 g/dL (ref 30.0–36.0)
MCHC: 34 g/dL (ref 30.0–36.0)
MCHC: 34.3 g/dL (ref 30.0–36.0)
MCHC: 34.3 g/dL (ref 30.0–36.0)
MCHC: 34.4 g/dL (ref 30.0–36.0)
MCHC: 34.8 g/dL (ref 30.0–36.0)
MCHC: 33.5 g/dL (ref 30.0–36.0)
MCHC: 33.8 g/dL (ref 30.0–36.0)
MCHC: 33.9 g/dL (ref 30.0–36.0)
MCHC: 34 g/dL (ref 30.0–36.0)
MCHC: 34 g/dL (ref 30.0–36.0)
MCV: 105.1 fL — ABNORMAL HIGH (ref 78.0–100.0)
MCV: 105.6 fL — ABNORMAL HIGH (ref 78.0–100.0)
MCV: 105.8 fL — ABNORMAL HIGH (ref 78.0–100.0)
MCV: 103.6 fL — ABNORMAL HIGH (ref 78.0–100.0)
MCV: 103.7 fL — ABNORMAL HIGH (ref 78.0–100.0)
MCV: 104.5 fL — ABNORMAL HIGH (ref 78.0–100.0)
MCV: 105.4 fL — ABNORMAL HIGH (ref 78.0–100.0)
Platelets: 325 10*3/uL (ref 150–400)
Platelets: 384 10*3/uL (ref 150–400)
Platelets: 417 10*3/uL — ABNORMAL HIGH (ref 150–400)
Platelets: 568 10*3/uL — ABNORMAL HIGH (ref 150–400)
Platelets: 141 10*3/uL — ABNORMAL LOW (ref 150–400)
Platelets: 250 10*3/uL (ref 150–400)
Platelets: 294 10*3/uL (ref 150–400)
Platelets: 32 10*3/uL — ABNORMAL LOW (ref 150–400)
Platelets: 79 10*3/uL — ABNORMAL LOW (ref 150–400)
RBC: 2.59 MIL/uL — ABNORMAL LOW (ref 4.22–5.81)
RBC: 2.62 MIL/uL — ABNORMAL LOW (ref 4.22–5.81)
RBC: 2.76 MIL/uL — ABNORMAL LOW (ref 4.22–5.81)
RBC: 2.88 MIL/uL — ABNORMAL LOW (ref 4.22–5.81)
RBC: 2.9 MIL/uL — ABNORMAL LOW (ref 4.22–5.81)
RBC: 3.29 MIL/uL — ABNORMAL LOW (ref 4.22–5.81)
RBC: 3.74 MIL/uL — ABNORMAL LOW (ref 4.22–5.81)
RBC: 4.44 MIL/uL (ref 4.22–5.81)
RDW: 14.1 % (ref 11.5–15.5)
RDW: 14.1 % (ref 11.5–15.5)
RDW: 14.2 % (ref 11.5–15.5)
RDW: 14.5 % (ref 11.5–15.5)
RDW: 14.6 % (ref 11.5–15.5)
RDW: 14.9 % (ref 11.5–15.5)
RDW: 15 % (ref 11.5–15.5)
RDW: 15.4 % (ref 11.5–15.5)
RDW: 15.5 % (ref 11.5–15.5)
RDW: 15.6 % — ABNORMAL HIGH (ref 11.5–15.5)
RDW: 15.8 % — ABNORMAL HIGH (ref 11.5–15.5)
WBC: 10.1 10*3/uL (ref 4.0–10.5)
WBC: 6.2 10*3/uL (ref 4.0–10.5)
WBC: 13.6 10*3/uL — ABNORMAL HIGH (ref 4.0–10.5)
WBC: 9.1 10*3/uL (ref 4.0–10.5)
WBC: 9.4 10*3/uL (ref 4.0–10.5)

## 2010-04-23 LAB — CARDIAC PANEL(CRET KIN+CKTOT+MB+TROPI)
CK, MB: 0.5 ng/mL (ref 0.3–4.0)
CK, MB: 0.6 ng/mL (ref 0.3–4.0)
CK, MB: 0.7 ng/mL (ref 0.3–4.0)
Relative Index: 0.5 (ref 0.0–2.5)
Relative Index: INVALID (ref 0.0–2.5)
Total CK: 123 U/L (ref 7–232)
Total CK: 159 U/L (ref 7–232)
Total CK: 64 U/L (ref 7–232)
Total CK: 84 U/L (ref 7–232)
Troponin I: 0.02 ng/mL (ref 0.00–0.06)
Troponin I: 0.03 ng/mL (ref 0.00–0.06)
Troponin I: 0.03 ng/mL (ref 0.00–0.06)

## 2010-04-23 LAB — HEMOGLOBIN A1C
Hgb A1c MFr Bld: 5.9 % — ABNORMAL HIGH (ref <5.7)
Mean Plasma Glucose: 123 mg/dL — ABNORMAL HIGH (ref <117)

## 2010-04-23 LAB — BASIC METABOLIC PANEL
BUN: 11 mg/dL (ref 6–23)
BUN: 13 mg/dL (ref 6–23)
BUN: 14 mg/dL (ref 6–23)
CO2: 21 mEq/L (ref 19–32)
CO2: 24 mEq/L (ref 19–32)
Calcium: 8.1 mg/dL — ABNORMAL LOW (ref 8.4–10.5)
Calcium: 8.2 mg/dL — ABNORMAL LOW (ref 8.4–10.5)
Calcium: 8 mg/dL — ABNORMAL LOW (ref 8.4–10.5)
Chloride: 115 mEq/L — ABNORMAL HIGH (ref 96–112)
Creatinine, Ser: 0.68 mg/dL (ref 0.4–1.5)
Creatinine, Ser: 0.74 mg/dL (ref 0.4–1.5)
Creatinine, Ser: 0.79 mg/dL (ref 0.4–1.5)
Creatinine, Ser: 0.84 mg/dL (ref 0.4–1.5)
GFR calc Af Amer: >60 mL/min (ref >60)
GFR calc Af Amer: >60 mL/min (ref >60)
GFR calc Af Amer: >60 mL/min (ref >60)
GFR calc non Af Amer: >60 mL/min (ref >60)
GFR calc non Af Amer: >60 mL/min (ref >60)
GFR calc non Af Amer: >60 mL/min (ref >60)
Glucose, Bld: 91 mg/dL (ref 70–99)
Glucose, Bld: 113 mg/dL — ABNORMAL HIGH (ref 70–99)
Sodium: 136 mEq/L (ref 135–145)

## 2010-04-23 LAB — CULTURE, BLOOD (ROUTINE X 2)
Culture: NO GROWTH
Culture: NO GROWTH

## 2010-04-23 LAB — LIPID PANEL
Cholesterol: 175 mg/dL (ref 0–200)
HDL: 28 mg/dL — ABNORMAL LOW (ref >39)

## 2010-04-23 LAB — URINE MICROSCOPIC-ADD ON

## 2010-04-23 LAB — COMPREHENSIVE METABOLIC PANEL
ALT: 27 U/L (ref 0–53)
ALT: 44 U/L (ref 0–53)
AST: 37 U/L (ref 0–37)
AST: 37 U/L (ref 0–37)
AST: 46 U/L — ABNORMAL HIGH (ref 0–37)
AST: 53 U/L — ABNORMAL HIGH (ref 0–37)
AST: 75 U/L — ABNORMAL HIGH (ref 0–37)
Albumin: 1.5 g/dL — ABNORMAL LOW (ref 3.5–5.2)
Albumin: 2 g/dL — ABNORMAL LOW (ref 3.5–5.2)
Albumin: 2.1 g/dL — ABNORMAL LOW (ref 3.5–5.2)
Albumin: 2.5 g/dL — ABNORMAL LOW (ref 3.5–5.2)
Alkaline Phosphatase: 51 U/L (ref 39–117)
Alkaline Phosphatase: 75 U/L (ref 39–117)
BUN: 14 mg/dL (ref 6–23)
BUN: 17 mg/dL (ref 6–23)
BUN: 27 mg/dL — ABNORMAL HIGH (ref 6–23)
CO2: 27 mEq/L (ref 19–32)
Calcium: 7.8 mg/dL — ABNORMAL LOW (ref 8.4–10.5)
Calcium: 7.9 mg/dL — ABNORMAL LOW (ref 8.4–10.5)
Calcium: 8.1 mg/dL — ABNORMAL LOW (ref 8.4–10.5)
Calcium: 8.4 mg/dL (ref 8.4–10.5)
Chloride: 111 mEq/L (ref 96–112)
Chloride: 112 mEq/L (ref 96–112)
Chloride: 99 mEq/L (ref 96–112)
Creatinine, Ser: 0.76 mg/dL (ref 0.4–1.5)
Creatinine, Ser: 0.95 mg/dL (ref 0.4–1.5)
Creatinine, Ser: 1.09 mg/dL (ref 0.4–1.5)
GFR calc Af Amer: >60 mL/min (ref >60)
GFR calc Af Amer: >60 mL/min (ref >60)
GFR calc Af Amer: >60 mL/min (ref >60)
GFR calc Af Amer: >60 mL/min (ref >60)
GFR calc non Af Amer: >60 mL/min (ref >60)
GFR calc non Af Amer: >60 mL/min (ref >60)
Glucose, Bld: 157 mg/dL — ABNORMAL HIGH (ref 70–99)
Glucose, Bld: 92 mg/dL (ref 70–99)
Potassium: 3.5 mEq/L (ref 3.5–5.1)
Potassium: 3.8 mEq/L (ref 3.5–5.1)
Potassium: 4.5 mEq/L (ref 3.5–5.1)
Sodium: 141 mEq/L (ref 135–145)
Sodium: 142 mEq/L (ref 135–145)
Sodium: 147 mEq/L — ABNORMAL HIGH (ref 135–145)
Total Bilirubin: 0.8 mg/dL (ref 0.3–1.2)
Total Bilirubin: 1.4 mg/dL — ABNORMAL HIGH (ref 0.3–1.2)
Total Bilirubin: 1.5 mg/dL — ABNORMAL HIGH (ref 0.3–1.2)
Total Protein: 5.2 g/dL — ABNORMAL LOW (ref 6.0–8.3)
Total Protein: 5.5 g/dL — ABNORMAL LOW (ref 6.0–8.3)
Total Protein: 5.7 g/dL — ABNORMAL LOW (ref 6.0–8.3)

## 2010-04-23 LAB — GLUCOSE, CAPILLARY
Glucose-Capillary: 102 mg/dL — ABNORMAL HIGH (ref 70–99)
Glucose-Capillary: 102 mg/dL — ABNORMAL HIGH (ref 70–99)
Glucose-Capillary: 102 mg/dL — ABNORMAL HIGH (ref 70–99)
Glucose-Capillary: 107 mg/dL — ABNORMAL HIGH (ref 70–99)
Glucose-Capillary: 109 mg/dL — ABNORMAL HIGH (ref 70–99)
Glucose-Capillary: 110 mg/dL — ABNORMAL HIGH (ref 70–99)
Glucose-Capillary: 111 mg/dL — ABNORMAL HIGH (ref 70–99)
Glucose-Capillary: 121 mg/dL — ABNORMAL HIGH (ref 70–99)
Glucose-Capillary: 124 mg/dL — ABNORMAL HIGH (ref 70–99)
Glucose-Capillary: 125 mg/dL — ABNORMAL HIGH (ref 70–99)
Glucose-Capillary: 125 mg/dL — ABNORMAL HIGH (ref 70–99)
Glucose-Capillary: 126 mg/dL — ABNORMAL HIGH (ref 70–99)
Glucose-Capillary: 136 mg/dL — ABNORMAL HIGH (ref 70–99)
Glucose-Capillary: 141 mg/dL — ABNORMAL HIGH (ref 70–99)
Glucose-Capillary: 147 mg/dL — ABNORMAL HIGH (ref 70–99)
Glucose-Capillary: 93 mg/dL (ref 70–99)
Glucose-Capillary: 99 mg/dL (ref 70–99)
Glucose-Capillary: 99 mg/dL (ref 70–99)

## 2010-04-23 LAB — CULTURE, RESPIRATORY W GRAM STAIN

## 2010-04-23 LAB — URINALYSIS, ROUTINE W REFLEX MICROSCOPIC
Leukocytes, UA: NEGATIVE
Specific Gravity, Urine: 1.026 (ref 1.005–1.030)
Urobilinogen, UA: 1 mg/dL (ref 0.0–1.0)

## 2010-04-23 LAB — DIFFERENTIAL
Basophils Absolute: 0 10*3/uL (ref 0.0–0.1)
Basophils Absolute: 0 10*3/uL (ref 0.0–0.1)
Basophils Absolute: 0 10*3/uL (ref 0.0–0.1)
Basophils Absolute: 0 10*3/uL (ref 0.0–0.1)
Basophils Relative: 1 % (ref 0–1)
Basophils Relative: 0 % (ref 0–1)
Eosinophils Absolute: 0.1 10*3/uL (ref 0.0–0.7)
Eosinophils Absolute: 0 10*3/uL (ref 0.0–0.7)
Eosinophils Relative: 0 % (ref 0–5)
Eosinophils Relative: 1 % (ref 0–5)
Lymphocytes Relative: 10 % — ABNORMAL LOW (ref 12–46)
Lymphocytes Relative: 6 % — ABNORMAL LOW (ref 12–46)
Lymphs Abs: 1 10*3/uL (ref 0.7–4.0)
Lymphs Abs: 0.8 10*3/uL (ref 0.7–4.0)
Lymphs Abs: 1 10*3/uL (ref 0.7–4.0)
Monocytes Absolute: 1.1 10*3/uL — ABNORMAL HIGH (ref 0.1–1.0)
Monocytes Absolute: 0.9 10*3/uL (ref 0.1–1.0)
Monocytes Absolute: 1.3 10*3/uL — ABNORMAL HIGH (ref 0.1–1.0)
Monocytes Relative: 10 % (ref 3–12)
Monocytes Relative: 29 % — ABNORMAL HIGH (ref 3–12)
Neutro Abs: 5.4 10*3/uL (ref 1.7–7.7)
Neutro Abs: 7.8 10*3/uL — ABNORMAL HIGH (ref 1.7–7.7)
Neutro Abs: 10.6 10*3/uL — ABNORMAL HIGH (ref 1.7–7.7)
Neutro Abs: 7.3 10*3/uL (ref 1.7–7.7)
Neutrophils Relative %: 73 % (ref 43–77)
Neutrophils Relative %: 63 % (ref 43–77)
Neutrophils Relative %: 84 % — ABNORMAL HIGH (ref 43–77)

## 2010-04-23 LAB — POCT CARDIAC MARKERS
CKMB, poc: 1.1 ng/mL (ref 1.0–8.0)
Troponin i, poc: <0.05 ng/mL (ref 0.00–0.09)

## 2010-04-23 LAB — BLOOD GAS, ARTERIAL
Acid-Base Excess: 1.5 mmol/L (ref 0.0–2.0)
Acid-base deficit: 0.2 mmol/L (ref 0.0–2.0)
Acid-base deficit: 1.5 mmol/L (ref 0.0–2.0)
Bicarbonate: 21.1 mEq/L (ref 20.0–24.0)
Bicarbonate: 23.4 mEq/L (ref 20.0–24.0)
Drawn by: 275531
Drawn by: 30996
O2 Content: 2 L/min
O2 Content: 3 L/min
O2 Saturation: 84.8 %
TCO2: 18.6 mmol/L (ref 0–100)
TCO2: 20.7 mmol/L (ref 0–100)
pCO2 arterial: 31 mmHg — ABNORMAL LOW (ref 35.0–45.0)
pCO2 arterial: 36.6 mmHg (ref 35.0–45.0)
pH, Arterial: 7.422 (ref 7.350–7.450)
pO2, Arterial: 48.8 mmHg — ABNORMAL LOW (ref 80.0–100.0)
pO2, Arterial: 74.7 mmHg — ABNORMAL LOW (ref 80.0–100.0)

## 2010-04-23 LAB — HEPARIN LEVEL (UNFRACTIONATED)
Heparin Unfractionated: <0.1 IU/mL — ABNORMAL LOW (ref 0.30–0.70)
Heparin Unfractionated: 0.31 IU/mL (ref 0.30–0.70)
Heparin Unfractionated: 0.41 IU/mL (ref 0.30–0.70)
Heparin Unfractionated: 0.49 IU/mL (ref 0.30–0.70)
Heparin Unfractionated: 0.5 IU/mL (ref 0.30–0.70)
Heparin Unfractionated: <0.1 IU/mL — ABNORMAL LOW (ref 0.30–0.70)

## 2010-04-23 LAB — RAPID URINE DRUG SCREEN, HOSP PERFORMED
Amphetamines: NOT DETECTED
Barbiturates: NOT DETECTED
Benzodiazepines: NOT DETECTED
Cocaine: NOT DETECTED
Opiates: NOT DETECTED

## 2010-04-23 LAB — AFP TUMOR MARKER: AFP-Tumor Marker: 3.1 ng/mL (ref 0.0–8.0)

## 2010-04-23 LAB — LIPASE, BLOOD: Lipase: 136 U/L — ABNORMAL HIGH (ref 11–59)

## 2010-04-23 LAB — PROCALCITONIN: Procalcitonin: <0.5 ng/mL

## 2010-04-23 LAB — HEPATITIS PANEL, ACUTE: Hep B C IgM: NEGATIVE

## 2010-04-23 LAB — LACTIC ACID, PLASMA
Lactic Acid, Venous: 1.7 mmol/L (ref 0.5–2.2)
Lactic Acid, Venous: 2.9 mmol/L — ABNORMAL HIGH (ref 0.5–2.2)

## 2010-04-23 LAB — AMMONIA: Ammonia: 10 umol/L — ABNORMAL LOW (ref 11–35)

## 2010-04-23 LAB — PROTIME-INR: INR: 1 (ref 0.00–1.49)

## 2010-04-23 LAB — MRSA PCR SCREENING: MRSA by PCR: POSITIVE — AB

## 2010-04-23 LAB — BRAIN NATRIURETIC PEPTIDE: Pro B Natriuretic peptide (BNP): 102 pg/mL — ABNORMAL HIGH (ref 0.0–100.0)

## 2010-04-23 LAB — URINE CULTURE

## 2010-04-23 LAB — APTT: aPTT: 28 seconds (ref 24–37)

## 2010-04-24 NOTE — Letter (Signed)
Summary: [facility]  [facility]   Imported By: Sherian Rein Jun 28, 202012 10:12:59  _____________________________________________________________________  External Attachment:    Type:   Image     Comment:   External Document

## 2010-05-01 ENCOUNTER — Other Ambulatory Visit: Payer: Self-pay | Admitting: Neurosurgery

## 2010-05-01 DIAGNOSIS — M545 Low back pain: Secondary | ICD-10-CM

## 2010-05-02 ENCOUNTER — Ambulatory Visit (INDEPENDENT_AMBULATORY_CARE_PROVIDER_SITE_OTHER): Payer: Medicare Other | Admitting: *Deleted

## 2010-05-02 DIAGNOSIS — Z7901 Long term (current) use of anticoagulants: Secondary | ICD-10-CM | POA: Insufficient documentation

## 2010-05-02 DIAGNOSIS — I82409 Acute embolism and thrombosis of unspecified deep veins of unspecified lower extremity: Secondary | ICD-10-CM

## 2010-05-02 DIAGNOSIS — I4891 Unspecified atrial fibrillation: Secondary | ICD-10-CM

## 2010-05-02 LAB — POCT INR: INR: 1.9

## 2010-05-02 NOTE — Patient Instructions (Signed)
INR 1.9 Today take 2 tablets total (take 1/2 tablet more than you have already taken today).  Then continue taking 1 1/2 tablets (7.5mg ) daily. Recheck in 3 weeks.

## 2010-05-07 ENCOUNTER — Ambulatory Visit
Admission: RE | Admit: 2010-05-07 | Discharge: 2010-05-07 | Disposition: A | Discharge status: Home / Self Care | Payer: Medicare Other | Source: Ambulatory Visit | Attending: Neurosurgery | Admitting: Neurosurgery

## 2010-05-07 DIAGNOSIS — M545 Low back pain: Secondary | ICD-10-CM

## 2010-05-07 MED ORDER — GADOBENATE DIMEGLUMINE 529 MG/ML IV SOLN
15.0000 mL | Freq: Once | INTRAVENOUS | Status: AC | PRN
  Administered 2010-05-07: 15 mL via INTRAVENOUS

## 2010-05-23 ENCOUNTER — Ambulatory Visit (INDEPENDENT_AMBULATORY_CARE_PROVIDER_SITE_OTHER): Payer: Medicare Other | Admitting: *Deleted

## 2010-05-23 DIAGNOSIS — I82409 Acute embolism and thrombosis of unspecified deep veins of unspecified lower extremity: Secondary | ICD-10-CM

## 2010-05-23 DIAGNOSIS — I4891 Unspecified atrial fibrillation: Secondary | ICD-10-CM

## 2010-05-23 LAB — POCT INR: INR: 2.9

## 2010-06-06 ENCOUNTER — Telehealth: Payer: Self-pay | Admitting: Pulmonary Disease

## 2010-06-06 NOTE — Telephone Encounter (Signed)
PT aware of ov for 5/30 @ 4 pm and we hjave cancelled 6/5 appt.

## 2010-06-06 NOTE — Telephone Encounter (Signed)
Pt states he needs to have back surgery and needs surgical clearance by Dr. Kriste Basque before surgery can be scheduled. Pt states Dr. Venetia Maxon will be doing the surgery. Next available isn't until 07/10/10 and pt can't wait that long to have surgery done. Please advise of a work in spot for pt. Thanks  Carver Fila, CMA

## 2010-06-06 NOTE — Telephone Encounter (Signed)
Can add pt on for 5-30 at 4pm.  thanks

## 2010-06-20 ENCOUNTER — Other Ambulatory Visit: Payer: Self-pay | Admitting: Pulmonary Disease

## 2010-06-20 ENCOUNTER — Ambulatory Visit (INDEPENDENT_AMBULATORY_CARE_PROVIDER_SITE_OTHER): Payer: Medicare Other | Admitting: *Deleted

## 2010-06-20 DIAGNOSIS — I82409 Acute embolism and thrombosis of unspecified deep veins of unspecified lower extremity: Secondary | ICD-10-CM

## 2010-06-20 DIAGNOSIS — I4891 Unspecified atrial fibrillation: Secondary | ICD-10-CM

## 2010-06-20 LAB — POCT INR: INR: 2.2
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
## 2010-06-22 NOTE — Assessment & Plan Note (Signed)
[facility]                               PULMONARY OFFICE NOTE   NAME:Kurt [name], Kurt [name]                      MRN:          [id number]  DATE:12/02/2005                            DOB:          22-Sep-1940    HISTORY OF PRESENT ILLNESS:  The patient is a 70 year old African-American  male patient of Dr. Jodelle Green who has a known history of COPD who continues to  smoke and gastroesophageal reflux.  The patient presents today complaining  of a 2-week history of abdominal bloating and gas.  The patient complains of  lower abdominal cramping.  He denies any nausea, vomiting, diarrhea, chest  pain, orthopnea, PND.  The patient also complains he has had some increased  urination and urgency over the last week.  The patient also complains he has  had some increased cough, congestion, but it still is thick without any  purulent sputum.  He denies any hemoptysis, chest pain, orthopnea, PND or  fever.   PHYSICAL EXAMINATION:  GENERAL:  The patient is a pleasant black male in no  acute distress.  VITAL SIGNS:  He is afebrile with stable vital signs.  O2 saturation is 99%  on room air.  HEENT:  Unremarkable.  NECK:  Supple without adenopathy.  No JVD.  LUNG SOUNDS:  Diminished in the bases, otherwise clear.  CARDIAC:  Regular rate and rhythm.  ABDOMEN:  Soft with positive bowel sounds in all four quadrants.  No  guarding or rebound.  No hepatosplenomegaly.  EXTREMITIES:  Warm without any calf tenderness, cyanosis, clubbing or edema.   IMPRESSION AND PLAN:  1. Abdominal pain bloating and gas.  Suspect this is underlying gastritis      with possible irritable bowel syndrome and reflux.  The patient is to      begin Protonix 40 mg daily along with antireflux preventative measures.      The patient will avoid lactose products over the next 5-7 days and may      use Levsin 0.125 mg sublingual up to three times a day as needed.  The      patient will recheck with Dr.  Kriste Basque in 2 weeks or sooner if needed.  2. Suspected mild viral upper respiratory infection.  The patient is begin      Mucinex DM twice daily.  The patient is advised on smoking cessation.  3. Polyuria and urinary urgency.  Urinalysis and culture is pending along      with a BMET.   The patient will be rechecked in 2 weeks or sooner if needed.     ______________________________  Rubye Oaks, NP    ______________________________  Lonzo Cloud. Kriste Basque, MD   TP/MedQ  DD: 12/02/2005  DT: 12/03/2005  Job #: [id number]

## 2010-06-26 ENCOUNTER — Encounter: Payer: Self-pay | Admitting: Pulmonary Disease

## 2010-07-04 ENCOUNTER — Ambulatory Visit (INDEPENDENT_AMBULATORY_CARE_PROVIDER_SITE_OTHER): Payer: Medicare Other | Admitting: Pulmonary Disease

## 2010-07-04 ENCOUNTER — Encounter: Payer: Self-pay | Admitting: Pulmonary Disease

## 2010-07-04 VITALS — BP 122/70 | HR 90 | Temp 97.3°F | Ht 70.0 in | Wt 166.6 lb

## 2010-07-04 DIAGNOSIS — K5730 Diverticulosis of large intestine without perforation or abscess without bleeding: Secondary | ICD-10-CM

## 2010-07-04 DIAGNOSIS — F17200 Nicotine dependence, unspecified, uncomplicated: Secondary | ICD-10-CM

## 2010-07-04 DIAGNOSIS — I82409 Acute embolism and thrombosis of unspecified deep veins of unspecified lower extremity: Secondary | ICD-10-CM

## 2010-07-04 DIAGNOSIS — M47817 Spondylosis without myelopathy or radiculopathy, lumbosacral region: Secondary | ICD-10-CM

## 2010-07-04 DIAGNOSIS — I4891 Unspecified atrial fibrillation: Secondary | ICD-10-CM

## 2010-07-04 DIAGNOSIS — Z01818 Encounter for other preprocedural examination: Secondary | ICD-10-CM

## 2010-07-04 DIAGNOSIS — E7800 Pure hypercholesterolemia, unspecified: Secondary | ICD-10-CM

## 2010-07-04 DIAGNOSIS — K219 Gastro-esophageal reflux disease without esophagitis: Secondary | ICD-10-CM

## 2010-07-04 DIAGNOSIS — F1010 Alcohol abuse, uncomplicated: Secondary | ICD-10-CM

## 2010-07-04 DIAGNOSIS — J449 Chronic obstructive pulmonary disease, unspecified: Secondary | ICD-10-CM

## 2010-07-04 NOTE — Patient Instructions (Signed)
Today we updated your med list in EPIC...     We checked your EKG and we will arrange for a blood flow test on your legs to see if there are any residual blood clots present...    If this is neg, as expected, I feel it would be OK to STOP the Coumadin at this time and proceed w/ your back surgery...    You will need to be carefully monitored during the post op period to be sure your heart stay regular & you don't develop any blood clots...  You desperately need to Quit the smoking, and once again stop the drinking (including the beers) before you schedule surgery...  Call for any questions...  Let's plan a follow up medical eval in 3-4 months, sooner if needed for problems.Marland KitchenMarland Kitchen

## 2010-07-05 ENCOUNTER — Encounter: Payer: Self-pay | Admitting: Pulmonary Disease

## 2010-07-05 NOTE — Progress Notes (Signed)
Subjective:    Patient ID: Kurt [name], male    DOB: [birth year], 70 y.o.   MRN: [id number]  HPI 70 y/o BM here for a follow up visit... he has multiple medical problems as noted below...    ~  October 31, 2009:  he was hosp 5/11 by [facility] w/ Etoh withdrawal & DTs, bilat pneumonia w/ resp insuffic, AFib, DVT, & Prevotella bacteremia (seen by ID & Rx w/ Invanz IV... disch to NHP & eventually back home in July after he finished his Antibiotic & Lovenox transitioned to Coumadin (followed in the CC)... he notes "weak", sour taste in mouth, increased urination, back pain, etc... he states he has quit drinking & smoking... we will Rx w/ Tramadol for pain... extensive records from [facility] & f/u have been reviewed w/ pt.  ~  March 29, 2010:  he saw DrStern 12/11 & "I've got a whole lot of arthritis in my back"> note reviewed & he mentioned no neuro deficits & rec for Voltaren Gel for his LBP (hx 2 prev surgeries- Lumbar Lam & disckectomy in 1985 & 1990)...  he states he is not drinking at present but still smoking 1/2 ppd & notes min cough, sputum, drainage, dyspnea> rec Mucinex, Claritin, nasal saline Prn... he is on Coumadin for his prev DVT & PAF> followed in the CC & doing satis w/o CP, palpit, dizzy, syncope, etc... we will f/u CXR (COPD, DJD sp, NAD), and non-fasting labs (all essen WNL x VitD=19 & rec for OTC daily supplement... OK PNEUMOVAX today.  ~  May 30,2012:  40mo ROV & needs decision regarding anticoagulation in advance of planned back surg by DrStern> MRI Lumbar Spine 4/12 showed multilevel degenerative changes severe spinal stenosis L3-4 due to central disc protrusion (prior decompression on right at L4-5 & left at L5-S1 are unchanged)...  He has hx chronic alcoholism & Etoh withdrawal w/ DTs 5/11> during that hospitalization he had transient AFib & was seen by DrBrackbill for Cards, started on Diltiazem, & converted to NSR (no prob since then);  He also had bilat LE DVT & was treated w/ Lovenox  ==> Coumadin & has been followed in the Coumadin Clinic now for 43yr; he denies leg pain or swelling etc==> we discussed checking LE VenDopplers & if they are OK then we will stop his Coumadin at this point- he understands that his risk for future DVT is elevated by virtue of his hx of prev DVT but he can minimize that risk by staying active, avoiding trauma, & stopping the alcohol consumption... In this regard he informs me that he is once again smoking 1+ppd & that he was told by the CC that it would be OK to drink a few beers daily if he wanted to- I informed him that it certainly was NOT OK to drink the beer or whatever because he is an alcoholic & this isn't acceptable (wife shaking her head the whole time)...  He denies CP, palpit, dizzy, syncope, SOB/DOE, edema, etc> EKG today shows NSR, & minor NSSTTWA.    Plan is to check VenDopplers & if neg then stop Coumadin & OK to proceed w/ back surgery from the medical standpoint> his greatest risk would appear to be from his alcoholism, continued drinking, risk of withdrawal & DTs;  From the DVT prevention standpoint he should be managed w/ PAS hose, early ambulation & perioperative Lovenox if endorsed by Neurosurgery...   Problem List:  COPD (ICD-496),  CIGARETTE SMOKER (ICD-305.1),  Hx of ABNORMAL  CHEST XRAY (ICD-793.1) - +cig smoker >1ppd (quit transiently around the time of his hosp 5/11)... hx + for cough & whitish sputum, w/ congestion and chr stable DOE...  denies hemoptysis, worsening dyspnea, wheezing, chest pains, snoring, daytime hypersomnolence, etc... not currently on any resp meds> ~  Baseline CXR shows COPD, chr interstitial markings, NAD.Marland Kitchen. (no prev CT Chest). ~  Prev PFT's 3/05 showed FVC= 3.58 (79%), FEV1= 3.02 (96%), %1sec=84, mid-flows= 139%pred... lung volumes were normal and DLCO was WNL as well... ~  [facility] 5/11 w/ Etoh & bilat pneumonia w/ resp insuffic >> Disch summary reviewed. ~  CXR 2/12 showed COPD, DJD in spine,  NAD...  PAROXYSMAL ATRIAL FIBRILLATION (ICD-427.31) - he had AFib in hosp 5/11 due to DTs & bilat pneumonia- he converted to NSR on Cardizem, no prob since then... currently stable on DILTIAZEM CD 180mg /d...  PERIPHERAL VASCULAR DISEASE (ICD-443.9) - on ASA 81mg /d...  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  DVT (ICD-453.40) - venous dopplers pos for bilat DVT in hosp 5/11> not felt to be a good Coumadin candidate at the time & started on Lovenox, disch to NHP & eventually converted to COUMADIN- followed in the Coumadin Clinic for 25yr w/o problems... ~  5/12:  Repeat VenDiopplers ==> pending (if these are neg we plan to stop coumadin at this point).  HYPERCHOLESTEROLEMIA (ICD-272.0) - on diet alone... ~  FLP 6/07 showed TChol 219, TG 56, HDL 80, LDL 135 ~  FLP 10/10 showed TChol 197, TG 58, HDL 73, LDL 112  GERD (ICD-530.81) - on OTC Pepcid/ Zantac Prn... EGD in 1998 showed mild esophagitis... ~  5/12:  In light of his continued etoh- he is advised to take Prilosec vs Pepcid daily for mucosal protection...  DIVERTICULOSIS OF COLON (ICD-562.10),  COLONIC POLYPS (ICD-211.3) - last colonoscopy 7/05 by DrStark showed divertics & 5mm polyp= tubular adenoma... f/u planned 5 yrs.  ALCOHOL ABUSE (ICD-305.00) - admits to daily beer consumption... intermittent mild incr SGOT in past... adm to hosp 5/11 e/ Etoh withdrawal & DTs> he stopped transiently after that... he does not voice understanding of alcoholism, & hasn't joined AA etc, & restarted "beer" stating that the Coumadin Clinic told him it was OK.  URINARY FREQUENCY (ICD-788.41) - on [place] supplement... he c/o freq and nocturia- no burning, no blood, mild LTOS... ~  labs 5/08 showed PSA= 0.70 ~  labs 10/10 showed PSA= 0.51 ~  5/12:  He requests med trial for the frequency> try ENABLEX 7.5mg  daily.  FATIGUE (ICD-780.79) - on MVI + Vit D 1000/d per daughter... this has been a chronic issue over the  yrs...  NECK PAIN (ICD-723.1) - known cervical spondylosis w/ eval from DrRobinson in 90's and DrStern over the last 10 yrs...  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 last seen by DrStern 7/07 w/ multilevel lumbar DDD w/ foraminal stenosis and spinal stenosis at each level... currently c/o mod back pain, uses Tylenol Prn, mild assoc leg symptoms... ~  4/12:  Repeat eval by DrStern w/ MRI showing multilevel degenerative changes severe spinal stenosis L3-4 due to central disc protrusion (prior decompression on right at L4-5 & left at L5-S1 are unchanged).  ANXIETY (ICD-300.00) - not currently on meds...  INSOMNIA, CHRONIC (ICD-307.42)   Past Surgical History  Procedure Date  . 3 seperate lumbar laminectomies   . Right cts release     Dr. Merlyn Lot  12/00   . Outpatient Encounter Prescriptions as of 07/04/2010  Medication Sig Dispense Refill  . darifenacin (ENABLEX) 7.5 MG 24 hr tablet Take 7.5 mg by mouth daily. For your bladder   ==> new med this OV trial...    . diltiazem (CARDIZEM CD) 180 MG 24 hr capsule Once a day      . folic acid (FOLVITE) 1 MG tablet TAKE 1 TABLET BY MOUTH EVERY DAY      . loratadine (CLARITIN) 10 MG tablet Take 10 mg by mouth daily.        . Multiple Vitamins-Minerals (MENS MULTIVITAMIN PLUS PO) Take by mouth. Once a day       . traMADol (ULTRAM) 50 MG tablet Take 50 mg by mouth every 6 (six) hours as needed.        . warfarin (COUMADIN) 5 MG tablet Take by mouth as directed.          No Known Allergies  Current Medications, Allergies, Past Medical History, Past Surgical History, Family History, and Social History were reviewed in [employer] record.   Review of Systems        See HPI - all other systems neg except as noted... The patient complains of mild dyspnea on exertion, & back pain  The patient denies anorexia, fever, weight loss, weight gain, vision loss,  decreased hearing, hoarseness, chest pain, syncope, peripheral edema, prolonged cough, headaches, hemoptysis, abdominal pain, melena, hematochezia, severe indigestion/heartburn, hematuria, incontinence, muscle weakness, suspicious skin lesions, transient blindness, difficulty walking, depression, unusual weight change, abnormal bleeding, enlarged lymph nodes, and angioedema.   Objective:   Physical Exam    WD, WN, 70 y/o BM in NAD... Vital Signs:  Reviewed... GENERAL:  Alert & oriented; pleasant & cooperative... HEENT:  [place]/AT, EOM-wnl, PERRLA, EACs-clear, TMs-wnl, NOSE-clear, THROAT-clear & wnl. NECK:  Supple w/ fairROM; no JVD; normal carotid impulses w/o bruits; no thyromegaly or nodules palpated; no lymphadenopathy. CHEST:  Clear to P & A; without wheezes/ rales/ or rhonchi heard... HEART:  Regular Rhythm; without murmurs/ rubs/ or gallops detected... ABDOMEN:  Soft & nontender; normal bowel sounds; no organomegaly or masses palpated... EXT: without deformities, mild arthritic changes; no varicose veins/ +venous insuffic/ no edema. BACK:  scar of prev lumbar laminectomies... SLR is neg... NEURO:  CN's intact; motor testing normal; sensory testing normal; gait normal & balance OK. DERM:  No lesions noted; no rash etc...   Assessment & Plan:   Hx DVT>  He's been on Coumadin x1y now & prev DVT ppt by etoh withdrawal/ hosp/ immobility/etc;  We discussed checking Vendopplers now 7 if they are neg then we can stop the Coumadin at this point; he understands that he will always have an incr risk for recurrent DVT based on his prev hx of DVT, & other factors will incr that risk further (eg on-going alcohol abuse); he can minimize that risk by quiting alcohol, staying active, avoiding leg trauma, etc...  COPD>  He continues to smoke 1ppd & not interested in smoking cessation help etc;  Neither is he interested in any breathing medication "my breathing is fine, no problem"...  Hx PAF>  This was  transient & related to his DTs & bilat pneumonia during the 5/11 hosp;  He remains on Diltiazem & holding NSR;  DrBrackbills consult note is reviewed...  CHOL>  He needs to ret for fasting lipid profile follow up...  GERD>  He is rec to take either PRILOSEC 20mg  OTC daily or PEPCID 20mg   daily for mucosal protection in light of his chr alcoholism...  Etoh>  He is advised that it is NOT ok to drink beer & that he is a chronic alcoholic so it is not acceptable to consume any alcohol & that he desperately needs to join AA etc...  Urinary Freq>  Given trial Enablex today> if no improvement we will refer to Urology...  BACK PAIN>  S/p mult surg & DrStern plans more surg for HNP w/ spinal stenosis...  Anxiety/ Insomnia>  As noted.Marland KitchenMarland Kitchen

## 2010-07-06 ENCOUNTER — Encounter: Payer: Medicare Other | Admitting: *Deleted

## 2010-07-10 ENCOUNTER — Ambulatory Visit: Payer: Medicare Other | Admitting: Pulmonary Disease

## 2010-07-10 ENCOUNTER — Encounter: Payer: Medicare Other | Admitting: *Deleted

## 2010-07-12 ENCOUNTER — Encounter (INDEPENDENT_AMBULATORY_CARE_PROVIDER_SITE_OTHER): Payer: Medicare Other | Admitting: *Deleted

## 2010-07-12 ENCOUNTER — Other Ambulatory Visit: Payer: Self-pay | Admitting: *Deleted

## 2010-07-12 ENCOUNTER — Ambulatory Visit (INDEPENDENT_AMBULATORY_CARE_PROVIDER_SITE_OTHER): Payer: Medicare Other | Admitting: *Deleted

## 2010-07-12 DIAGNOSIS — I82409 Acute embolism and thrombosis of unspecified deep veins of unspecified lower extremity: Secondary | ICD-10-CM

## 2010-07-12 DIAGNOSIS — I87009 Postthrombotic syndrome without complications of unspecified extremity: Secondary | ICD-10-CM

## 2010-07-12 DIAGNOSIS — I4891 Unspecified atrial fibrillation: Secondary | ICD-10-CM

## 2010-07-12 LAB — POCT INR: INR: 2.8

## 2010-07-12 MED ORDER — WARFARIN SODIUM 5 MG PO TABS: 5.0000 mg | ORAL_TABLET | ORAL | Status: DC

## 2010-07-16 ENCOUNTER — Encounter: Payer: Self-pay | Admitting: Pulmonary Disease

## 2010-07-18 ENCOUNTER — Encounter: Payer: Medicare Other | Admitting: *Deleted

## 2010-07-24 ENCOUNTER — Ambulatory Visit: Payer: Self-pay | Admitting: Pharmacist

## 2010-07-24 ENCOUNTER — Telehealth: Payer: Self-pay | Admitting: *Deleted

## 2010-07-24 NOTE — Telephone Encounter (Signed)
Per SN---vas doppler was negative.  And recs per SN to decrease the coumadin 5mg  to 1 tablet daily until his supply is gone and do not refill.  i explained this to the patient 3 times and he repeated this the last time and was aware.  Called and spoke with coumadin clinic and they are aware that SN will stop pts coumadin after supply is done.  Pt is not to refill coumadin.

## 2010-08-09 ENCOUNTER — Encounter: Payer: Medicare Other | Admitting: *Deleted

## 2010-09-02 ENCOUNTER — Other Ambulatory Visit: Payer: Self-pay | Admitting: Pulmonary Disease

## 2010-09-19 ENCOUNTER — Other Ambulatory Visit: Payer: Self-pay | Admitting: *Deleted

## 2010-09-19 MED ORDER — OXYBUTYNIN CHLORIDE 5 MG PO TABS: 5.0000 mg | ORAL_TABLET | Freq: Two times a day (BID) | ORAL | Status: DC

## 2010-10-03 ENCOUNTER — Telehealth: Payer: Self-pay | Admitting: Pulmonary Disease

## 2010-10-03 NOTE — Telephone Encounter (Signed)
Called and spoke with pt and he stated that he is needing a shot in his back for pain, pt is requesting a cortisone shot.  i explained to the pt that we dont do those shots here in our office.  Pt stated that he has a back doctor but he will not give the pt a cortisone shot.  i explained to the pt that we could send him to an ortho doctor but i could not say that they would give him the shot either.  Pt stated that he will think about this and give [place] a call back.

## 2010-12-05 ENCOUNTER — Other Ambulatory Visit: Payer: Self-pay | Admitting: Pulmonary Disease

## 2011-03-13 IMAGING — CT CT ABD-PELV W/ CM
2 of 6 series · 15 of 46 positions shown, 19 images · IV contrast (agent unspecified)
Comparison: CT scan dated 06/28/2009

CLINICAL DATA: Diarrhea.  Weakness.

CT ABDOMEN AND PELVIS WITH CONTRAST
TECHNIQUE: Multidetector CT imaging of the abdomen and pelvis was
performed following the standard protocol during bolus
administration of intravenous contrast.
Contrast: 100 ml Kmnipaque-IWW

[Series 2: rtn a/p with · axial · 0.70mm/px · z∈[-470,-74]mm · 12 of 89 slices shown, 16 images]
[im 5/89  soft-tissue]
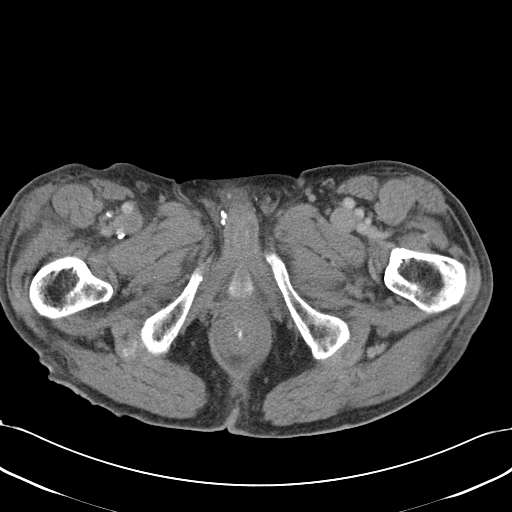
[im 5/89  bone]
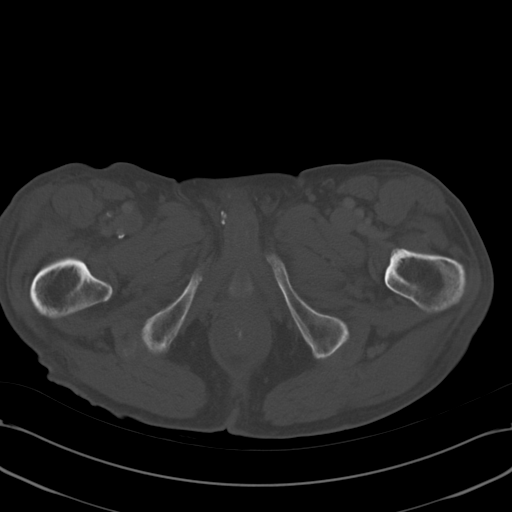
[im 14/89  soft-tissue]
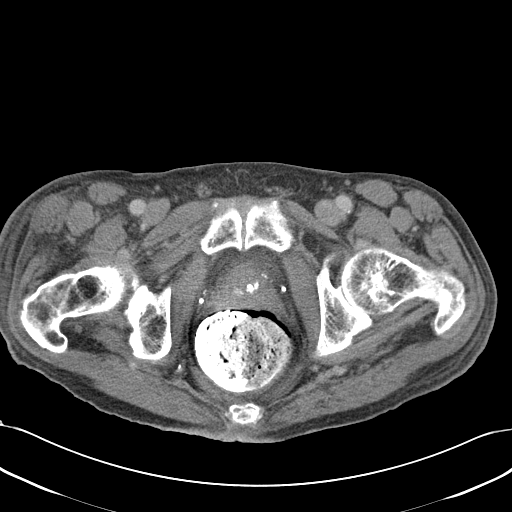
[im 24/89  soft-tissue]
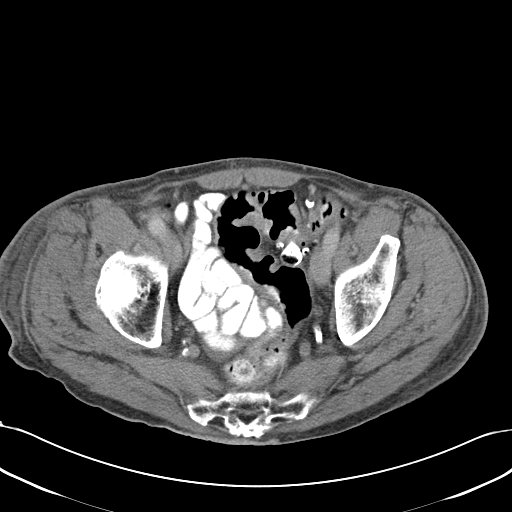
[im 33/89  soft-tissue]
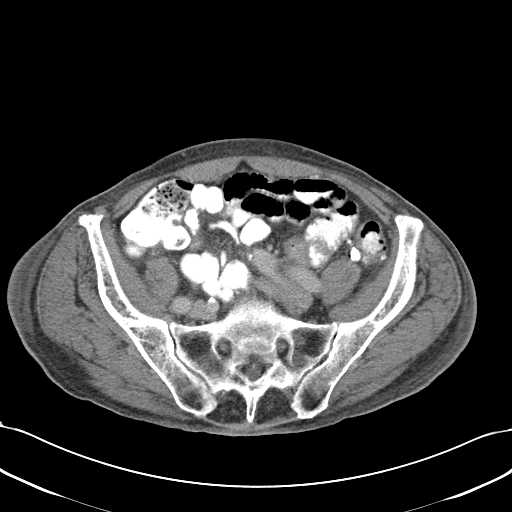
[im 42/89  soft-tissue]
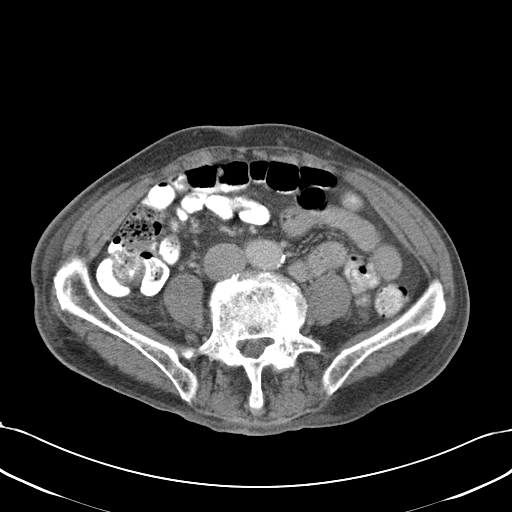
[im 47/89  soft-tissue]
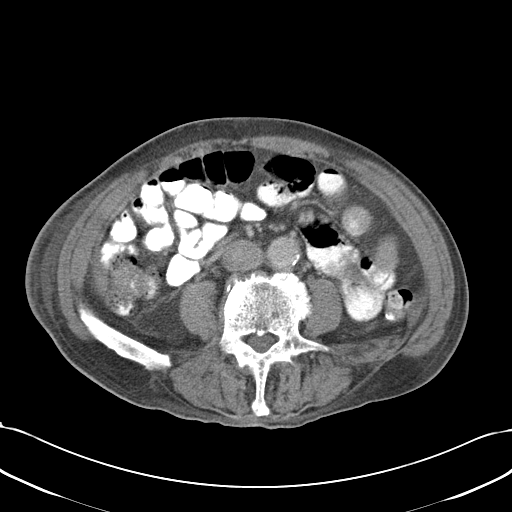
[im 56/89  soft-tissue]
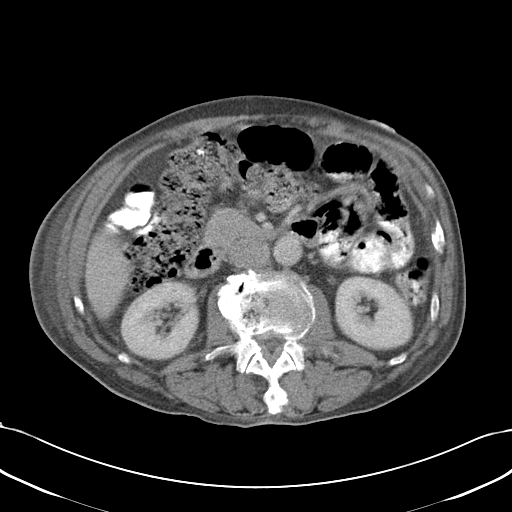
[im 65/89  soft-tissue]
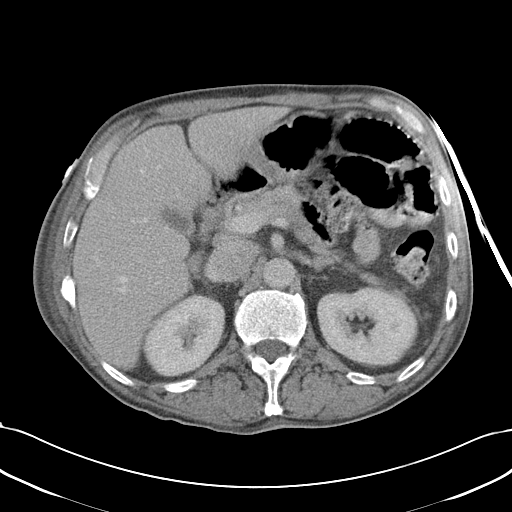
[im 70/89  lung]
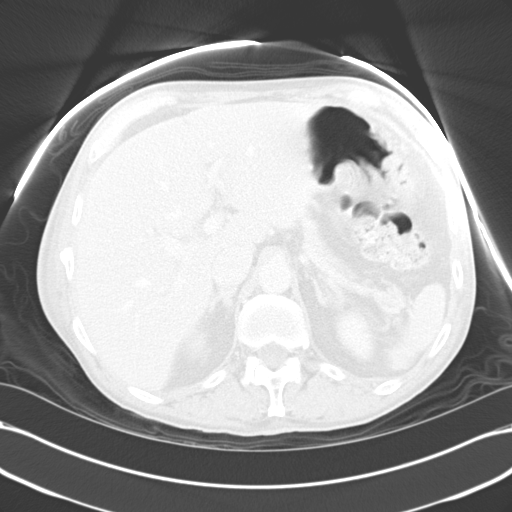
[im 75/89  soft-tissue]
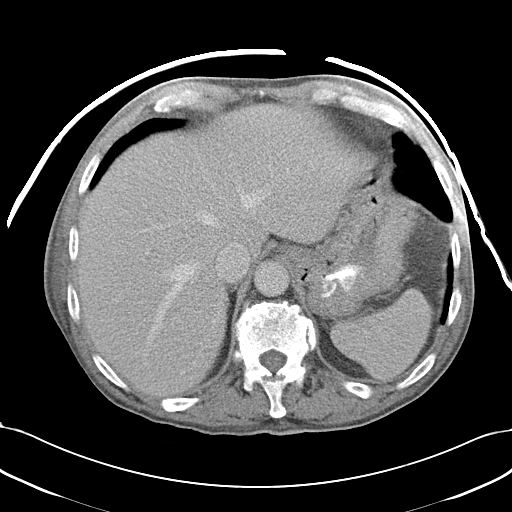
[im 75/89  lung]
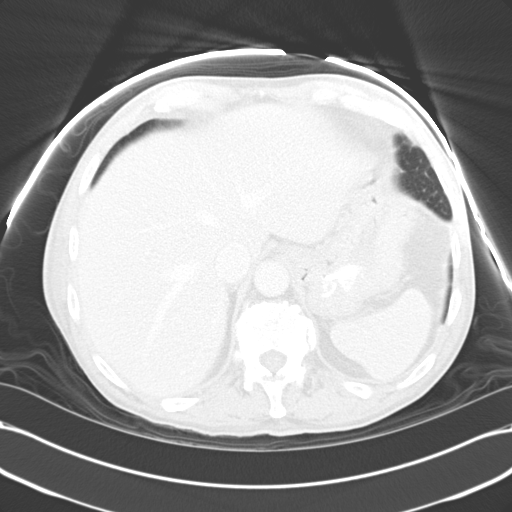
[im 75/89  bone]
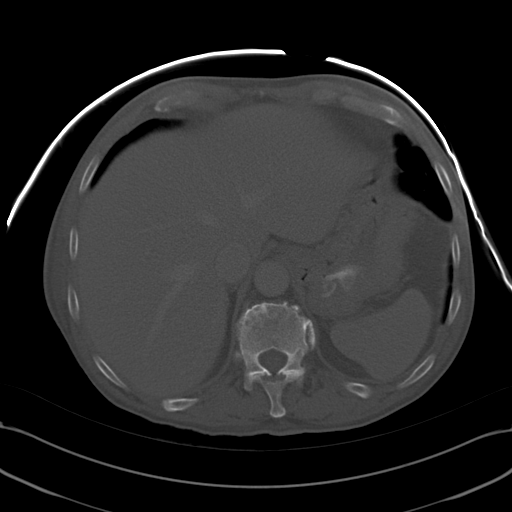
[im 79/89  lung]
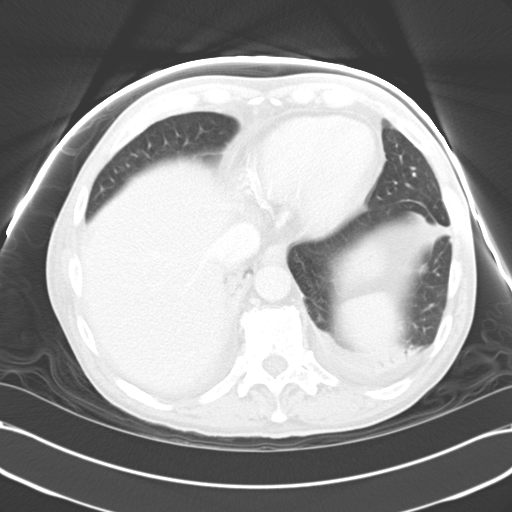
[im 84/89  soft-tissue]
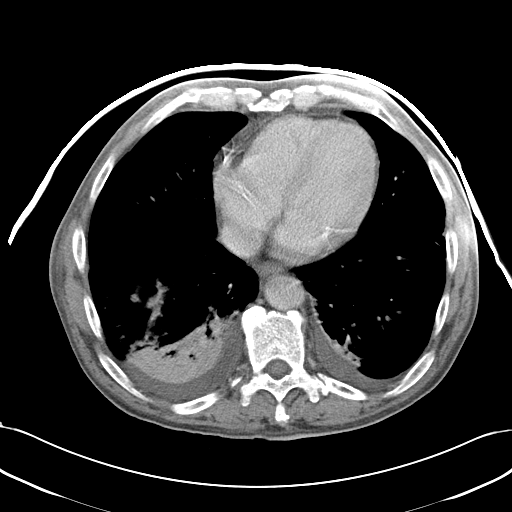
[im 84/89  lung]
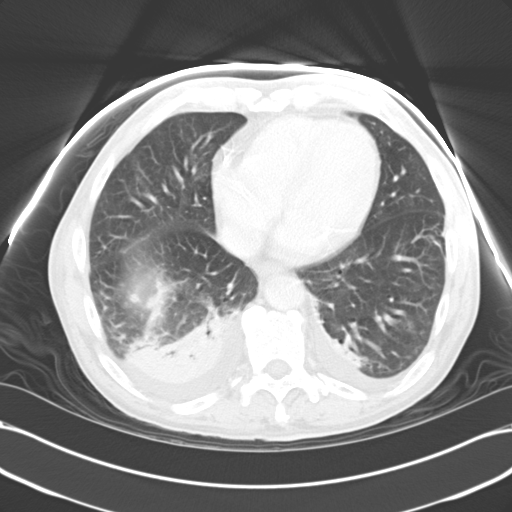

[Series 602: <mpr thick range> · coronal · 0.86mm/px · 3 of 86 slices shown]
[im 29/86  soft-tissue]
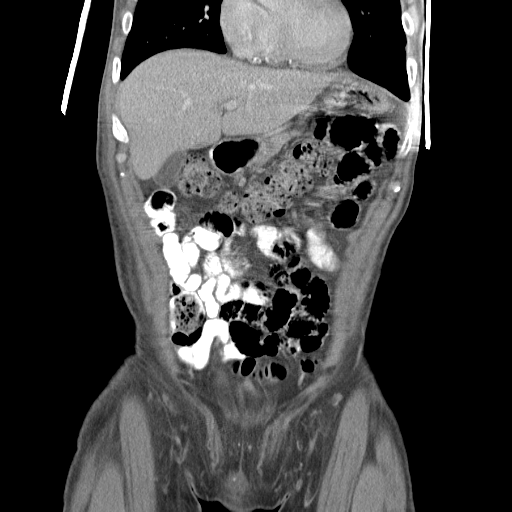
[im 38/86  soft-tissue]
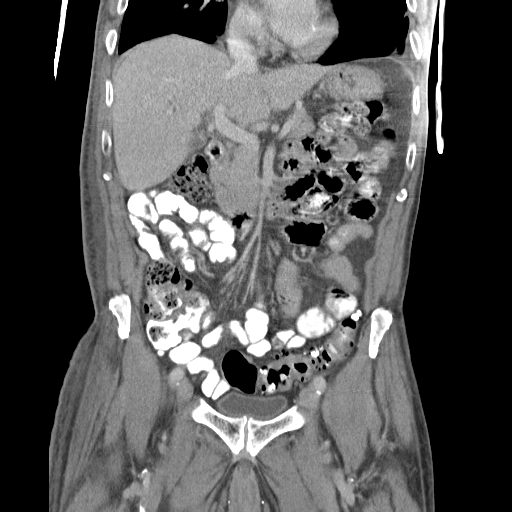
[im 48/86  soft-tissue]
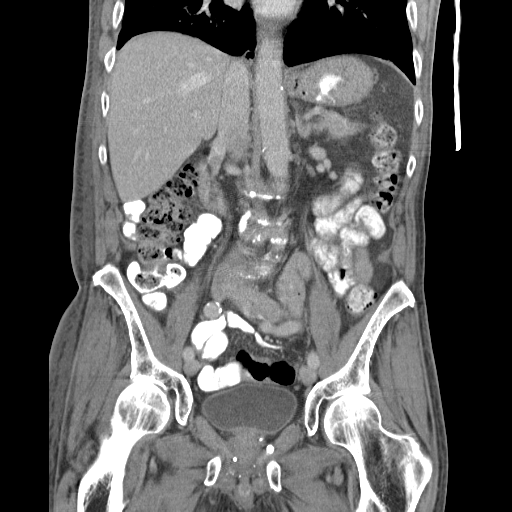

[15 of 46 positions shown; findings below may reference images not displayed]

FINDINGS: Since the prior examination the patient has developed
small bilateral pleural effusions.  There is increased atelectasis
and consolidation in the right lower lobe posteriorly.  There is
improved mild atelectasis at the left lung base.  There is no
nodularity persisting at the left lung base.

The liver, spleen, pancreas, adrenal glands and kidneys demonstrate
no significant abnormalities.  There is evidence of pancreas
divisum.

There is no distention of the gallbladder.  There is no free air in
the abdomen.

There is a tiny amount of free fluid in the pelvis which is new
since the prior study.  There is no discrete intra-abdominal
abscess.  There is extensive diverticulosis of the distal
descending and sigmoid portions of the colon without discrete acute
diverticulitis.

The patient has fairly severe spinal stenosis at L3-4.
IMPRESSION: Since the prior study the patient has developed a tiny amount of
ascites in the pelvis.  There is no discrete intra-abdominal
abscess or free air.
2.  New small bilateral pleural effusions with increased
atelectasis and consolidation in the right lower lobe.
3.  Improved atelectasis at the left lung base.
4.  Diverticulosis of the distal colon without discrete
diverticulitis.

## 2011-08-21 ENCOUNTER — Telehealth: Payer: Self-pay | Admitting: Pulmonary Disease

## 2011-08-21 NOTE — Telephone Encounter (Signed)
Pt accepted 08/26/11 at 2 with nadel

## 2011-08-26 ENCOUNTER — Other Ambulatory Visit (INDEPENDENT_AMBULATORY_CARE_PROVIDER_SITE_OTHER): Payer: Medicare Other

## 2011-08-26 ENCOUNTER — Ambulatory Visit (INDEPENDENT_AMBULATORY_CARE_PROVIDER_SITE_OTHER): Payer: Medicare Other | Admitting: Pulmonary Disease

## 2011-08-26 ENCOUNTER — Ambulatory Visit (INDEPENDENT_AMBULATORY_CARE_PROVIDER_SITE_OTHER)
Admission: RE | Admit: 2011-08-26 | Discharge: 2011-08-26 | Disposition: A | Discharge status: Home / Self Care | Payer: Medicare Other | Source: Ambulatory Visit | Attending: Pulmonary Disease | Admitting: Pulmonary Disease

## 2011-08-26 ENCOUNTER — Encounter: Payer: Self-pay | Admitting: Pulmonary Disease

## 2011-08-26 VITALS — BP 106/72 | HR 69 | Temp 97.2°F | Ht 70.0 in | Wt 160.6 lb

## 2011-08-26 DIAGNOSIS — I739 Peripheral vascular disease, unspecified: Secondary | ICD-10-CM

## 2011-08-26 DIAGNOSIS — F17200 Nicotine dependence, unspecified, uncomplicated: Secondary | ICD-10-CM

## 2011-08-26 DIAGNOSIS — E7800 Pure hypercholesterolemia, unspecified: Secondary | ICD-10-CM

## 2011-08-26 DIAGNOSIS — F411 Generalized anxiety disorder: Secondary | ICD-10-CM

## 2011-08-26 DIAGNOSIS — K219 Gastro-esophageal reflux disease without esophagitis: Secondary | ICD-10-CM

## 2011-08-26 DIAGNOSIS — K5730 Diverticulosis of large intestine without perforation or abscess without bleeding: Secondary | ICD-10-CM

## 2011-08-26 DIAGNOSIS — R350 Frequency of micturition: Secondary | ICD-10-CM

## 2011-08-26 DIAGNOSIS — G4700 Insomnia, unspecified: Secondary | ICD-10-CM

## 2011-08-26 DIAGNOSIS — J449 Chronic obstructive pulmonary disease, unspecified: Secondary | ICD-10-CM

## 2011-08-26 DIAGNOSIS — D126 Benign neoplasm of colon, unspecified: Secondary | ICD-10-CM

## 2011-08-26 DIAGNOSIS — M47817 Spondylosis without myelopathy or radiculopathy, lumbosacral region: Secondary | ICD-10-CM

## 2011-08-26 DIAGNOSIS — J4489 Other specified chronic obstructive pulmonary disease: Secondary | ICD-10-CM

## 2011-08-26 DIAGNOSIS — F1010 Alcohol abuse, uncomplicated: Secondary | ICD-10-CM

## 2011-08-26 DIAGNOSIS — I4891 Unspecified atrial fibrillation: Secondary | ICD-10-CM

## 2011-08-26 LAB — CBC WITH DIFFERENTIAL/PLATELET
Basophils Absolute: 0 10*3/uL (ref 0.0–0.1)
Basophils Relative: 0.7 % (ref 0.0–3.0)
Eosinophils Absolute: 0.1 10*3/uL (ref 0.0–0.7)
HCT: 44 % (ref 39.0–52.0)
Hemoglobin: 15 g/dL (ref 13.0–17.0)
Lymphocytes Relative: 42.5 % (ref 12.0–46.0)
Lymphs Abs: 1.7 10*3/uL (ref 0.7–4.0)
MCHC: 34.2 g/dL (ref 30.0–36.0)
Monocytes Relative: 13.4 % — ABNORMAL HIGH (ref 3.0–12.0)
Neutro Abs: 1.6 10*3/uL (ref 1.4–7.7)
RBC: 4.3 Mil/uL (ref 4.22–5.81)
RDW: 14 % (ref 11.5–14.6)

## 2011-08-26 LAB — HEPATIC FUNCTION PANEL
Alkaline Phosphatase: 69 U/L (ref 39–117)
Bilirubin, Direct: 0.1 mg/dL (ref 0.0–0.3)
Total Bilirubin: 0.6 mg/dL (ref 0.3–1.2)
Total Protein: 6.9 g/dL (ref 6.0–8.3)

## 2011-08-26 LAB — BASIC METABOLIC PANEL
CO2: 25 mEq/L (ref 19–32)
Calcium: 9.2 mg/dL (ref 8.4–10.5)
Creatinine, Ser: 1 mg/dL (ref 0.4–1.5)
GFR: 100.06 mL/min (ref >60.00)
Sodium: 141 mEq/L (ref 135–145)

## 2011-08-26 MED ORDER — TAMSULOSIN HCL 0.4 MG PO CAPS: 0.4000 mg | ORAL_CAPSULE | Freq: Every day | ORAL | Status: DC

## 2011-08-26 NOTE — Progress Notes (Signed)
Subjective:    Patient ID: Kurt [name], male    DOB: [birth year], 71 y.o.   MRN: [id number]  HPI 71 y/o BM here for a follow up visit... he has multiple medical problems as noted below...    ~  October 31, 2009:  he was hosp 5/11 by [facility] w/ Etoh withdrawal & DTs, bilat pneumonia w/ resp insuffic, AFib, DVT, & Prevotella bacteremia (seen by ID & Rx w/ Invanz IV... disch to NHP & eventually back home in July after he finished his Antibiotic & Lovenox transitioned to Coumadin (followed in the CC)... he notes "weak", sour taste in mouth, increased urination, back pain, etc... he states he has quit drinking & smoking... we will Rx w/ Tramadol for pain... extensive records from [facility] & f/u have been reviewed w/ pt.  ~  March 29, 2010:  he saw DrStern 12/11 & "I've got a whole lot of arthritis in my back"> note reviewed & he mentioned no neuro deficits & rec for Voltaren Gel for his LBP (hx 2 prev surgeries- Lumbar Lam & disckectomy in 1985 & 1990)...  he states he is not drinking at present but still smoking 1/2 ppd & notes min cough, sputum, drainage, dyspnea> rec Mucinex, Claritin, nasal saline Prn... he is on Coumadin for his prev DVT & PAF> followed in the CC & doing satis w/o CP, palpit, dizzy, syncope, etc... we will f/u CXR (COPD, DJD sp, NAD), and non-fasting labs (all essen WNL x VitD=19 & rec for OTC daily supplement... OK PNEUMOVAX today.  ~  May 30,2012:  51mo ROV & needs decision regarding anticoagulation in advance of planned back surg by DrStern> MRI Lumbar Spine 4/12 showed multilevel degenerative changes severe spinal stenosis L3-4 due to central disc protrusion (prior decompression on right at L4-5 & left at L5-S1 are unchanged)...  He has hx chronic alcoholism & Etoh withdrawal w/ DTs 5/11> during that hospitalization he had transient AFib & was seen by DrBrackbill for Cards, started on Diltiazem, & converted to NSR (no prob since then);  He also had bilat LE DVT & was treated w/ Lovenox  ==> Coumadin & has been followed in the Coumadin Clinic now for 73yr; he denies leg pain or swelling etc==> we discussed checking LE VenDopplers & if they are OK then we will stop his Coumadin at this point- he understands that his risk for future DVT is elevated by virtue of his hx of prev DVT but he can minimize that risk by staying active, avoiding trauma, & stopping the alcohol consumption... In this regard he informs me that he is once again smoking 1+ppd & that he was told by the CC that it would be OK to drink a few beers daily if he wanted to- I informed him that it certainly was NOT OK to drink the beer or whatever because he is an alcoholic & this isn't acceptable (wife shaking her head the whole time)...  He denies CP, palpit, dizzy, syncope, SOB/DOE, edema, etc> EKG today shows NSR, & minor NSSTTWA.    Plan is to check VenDopplers & if neg then stop Coumadin & OK to proceed w/ back surgery from the medical standpoint> his greatest risk would appear to be from his alcoholism, continued drinking, risk of withdrawal & DTs;  From the DVT prevention standpoint he should be managed w/ PAS hose, early ambulation & perioperative Lovenox if endorsed by Neurosurgery...  ~  August 26, 2011:  48mo ROV & here for pre-op clearance for  back surg- apparently DrMCohen plans back surg, we don't have Ortho notes but pt states that "he guaranteed 10-12 yrs of being pain free after the operation"... His only other complaints are LTOS including difficulty emptying bladder, urgency, decr force of stream, etc> and we discussed starting FLOMAX 0.4mg  Qd...    COPD> still smoking 1/2ppd & not interested in smoking cessation help; denies cough, sputum, hemoptysis, SOB, etc...    Hx PAF> occurred 5/11 in hosp for pneumonia & etoh withdrawal; converted to NSR on CardizemCD 180mg /d & ASA 81mg /d; remains in NSR w/o CP, palpit, edema...    Hx DVT> occurred 5/11 in hosp for pneumonia & etoh withdrawal> treated w/ Coumadin x 55yr &  stopped 5/12 w/ neg f/u ven dopplers...    Chol> on diet alone, he has refused meds in the past; last FLP on diet was improved w/ LDL=112; needs to ret fasting for f/u FLP...    GI> HYx GERD on OTC Pepcid prn; Hx divertics/ polyps & overdue for f/u colonoscopy; he denies abd pain, n/v, c/d, blood, etc...    Alcoholism> he still drinks daily- admits to 1 can of beer & 1pt of wine ([place] or [employer]) daily; hx of DT's during hosp 5/11 for pneumonia...    GU> LTOS persist despite his saw palmetto rx; tried Enablex 2012 w/o benefit & he stopped; rec FLOMAX 0.4mg  daily...    Neck & Back pain> he has CSpine spondylosis w/ evals from DrStern; and 3 separate lumbar surgeries over the yrs- known multilevel DDD w/ foraminal stenosis & spinal stenosis at each level (severe at L3-4); he states leg pain L>R; he had planned surg 2012 from    DrStern but he decided to pursue Chiropractic care which he said helped for awhile, then worse, & now seeing DrCohen who plans surg soon (we don't have notes from him)...    Anxiety/ Insomnia> not currently on meds due to his drinking... We reviewed prob list, meds, xrays and labs> see below for updates >> EKG 5/12 showed NSR, rate80, WNL, NAD... CXR 7/13 showed COPD w/ scarring at the bases, NAD, no CHF or effusion.Marland Kitchen LABS 7/13:  Chems- wnl;  CBC- wnl;  TSH=1.16;  PSA=0.41   Problem List:  COPD (ICD-496),  CIGARETTE SMOKER (ICD-305.1),  Hx of ABNORMAL CHEST XRAY (ICD-793.1) - +cig smoker & still smoking 1/2ppd (quit transiently around the time of his hosp 5/11)... hx + for cough & whitish sputum, w/ congestion and chr stable DOE...  denies hemoptysis, worsening dyspnea, wheezing, chest pains, snoring, daytime hypersomnolence, etc... not currently on any resp meds> ~  Baseline CXR shows COPD, chr interstitial markings, NAD.Marland Kitchen. (no prev CT Chest). ~  Prev PFT's 3/05 showed FVC= 3.58 (79%), FEV1= 3.02 (96%), %1sec=84, mid-flows= 139%pred... lung volumes were normal and  DLCO was WNL as well... ~  [facility] 5/11 w/ Etoh & bilat pneumonia w/ resp insuffic >> Disch summary reviewed. ~  CXR 2/12 showed COPD, DJD in spine, NAD.Marland Kitchen. ~  CXR 7/13 showed COPD w/ scarring at the bases, NAD, no CHF or effusion.Marland Kitchen  PAROXYSMAL ATRIAL FIBRILLATION (ICD-427.31) - he had AFib in hosp 5/11 due to DTs & bilat pneumonia- he converted to NSR on Cardizem, no prob since then... currently stable on DILTIAZEM CD 180mg /d & holding NSR...  PERIPHERAL VASCULAR DISEASE (ICD-443.9) - on ASA 81mg /d...  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  DVT (ICD-453.40) - venous dopplers pos for bilat DVT in hosp  5/11> not felt to be a good Coumadin candidate at the time & started on Lovenox, disch to NHP & eventually converted to COUMADIN- followed in the Coumadin Clinic for 15yr w/o problems... ~  5/12:  Repeat VenDiopplers ==> neg for DVT & Coumadin was discontinued... ~  7/13:  He reports no prob w/ recurrent DVT, leg swelling, etc... Exercise is lim by his LBP...  HYPERCHOLESTEROLEMIA (ICD-272.0) - on diet alone... ~  FLP 6/07 showed TChol 219, TG 56, HDL 80, LDL 135 ~  FLP 10/10 showed TChol 197, TG 58, HDL 73, LDL 112  GERD (ICD-530.81) - on OTC Pepcid/ Zantac Prn... EGD in 1998 showed mild esophagitis... ~  5/12:  In light of his continued etoh- he is advised to take Prilosec vs Pepcid daily for mucosal protection...  DIVERTICULOSIS OF COLON (ICD-562.10),  COLONIC POLYPS (ICD-211.3) - last colonoscopy 7/05 by DrStark showed divertics & 5mm polyp= tubular adenoma... f/u planned 5 yrs & he is now overdue...  ALCOHOL ABUSE (ICD-305.00) - admits to daily beer consumption... intermittent mild incr SGOT in past... adm to hosp 5/11 e/ Etoh withdrawal & DTs> he stopped transiently after that... he does not voice understanding of alcoholism, & hasn't joined AA etc, & restarted "beer" stating that the Coumadin Clinic told him it was OK. ~  7/13:  Now states he is  drinking 1 can beer/d and 1pt wine daily... Advised to quit completely & join AA.  URINARY FREQUENCY (ICD-788.41) - on Saw Palmetto supplement... he c/o freq and nocturia- no burning, no blood, mild LTOS... ~  labs 5/08 showed PSA= 0.70 ~  labs 10/10 showed PSA= 0.51 ~  5/12:  He requests med trial for the frequency> try ENABLEX 7.5mg  daily (f/u- he reports this wasn't helpful). ~  7/13:  He describes LTOS & we have rec trial FLOMAX 0.4mg  /d...  FATIGUE (ICD-780.79) - on MVI + Vit D 1000/d per daughter... this has been a chronic issue over the yrs...  NECK PAIN (ICD-723.1) - known cervical spondylosis w/ eval from DrRobinson in 90's and DrStern over the last 10 yrs...  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 last seen by DrStern 7/07 w/ multilevel lumbar DDD w/ foraminal stenosis and spinal stenosis at each level... currently c/o mod back pain, uses Tylenol Prn, mild assoc leg symptoms... ~  4/12:  Repeat eval by DrStern w/ MRI showing multilevel degenerative changes severe spinal stenosis L3-4 due to central disc protrusion (prior decompression on right at L4-5 & left at L5-S1 are unchanged); they planned surg but pt chose to see Chiropractor & states the adjustments are helping... ~  7/13:  Pt reports the Chiro rx stopped helping & he sought second opinion from DrCohen; we don't have notes from him, but pt states that "he guaranteed 10-12 yrs of being pain free after the operation"  ANXIETY (ICD-300.00) - not currently on meds...  INSOMNIA, CHRONIC (ICD-307.42) - not currently on meds...   Past Surgical History  Procedure Date  . 3 seperate lumbar laminectomies   . Right cts release     Dr. Merlyn Lot 12/00    Outpatient Encounter Prescriptions as of 08/26/2011  Medication Sig Dispense Refill  . diltiazem (CARDIZEM CD) 180 MG 24 hr capsule TAKE 1 CAPSULE BY MOUTH ONCE A DAY  30 capsule  11  . folic acid (FOLVITE) 1  MG tablet TAKE 1 TABLET BY MOUTH EVERY DAY  30 tablet  6  . loratadine (  CLARITIN) 10 MG tablet Take 10 mg by mouth daily.        . Multiple Vitamins-Minerals (MENS MULTIVITAMIN PLUS PO) Take by mouth. Once a day       . oxybutynin (DITROPAN) 5 MG tablet Take 1 tablet (5 mg total) by mouth 2 (two) times daily.  60 tablet  11  . traMADol (ULTRAM) 50 MG tablet Take 50 mg by mouth every 6 (six) hours as needed.        . warfarin (COUMADIN) 5 MG tablet TAKE 1 TABLET BY MOUTH AS DIRECTED.  50 tablet  0    No Known Allergies   Current Medications, Allergies, Past Medical History, Past Surgical History, Family History, and Social History were reviewed in [employer] record.   Review of Systems         See HPI - all other systems neg except as noted... The patient complains of mild dyspnea on exertion, & back pain  The patient denies anorexia, fever, weight loss, weight gain, vision loss, decreased hearing, hoarseness, chest pain, syncope, peripheral edema, prolonged cough, headaches, hemoptysis, abdominal pain, melena, hematochezia, severe indigestion/heartburn, hematuria, incontinence, muscle weakness, suspicious skin lesions, transient blindness, difficulty walking, depression, unusual weight change, abnormal bleeding, enlarged lymph nodes, and angioedema.   Objective:   Physical Exam    WD, WN, 70 y/o BM in NAD... Vital Signs:  Reviewed... GENERAL:  Alert & oriented; pleasant & cooperative... HEENT:  [place]/AT, EOM-wnl, PERRLA, EACs-clear, TMs-wnl, NOSE-clear, THROAT-clear & wnl. NECK:  Supple w/ fairROM; no JVD; normal carotid impulses w/o bruits; no thyromegaly or nodules palpated; no lymphadenopathy. CHEST:  Clear to P & A; without wheezes/ rales/ or rhonchi heard... HEART:  Regular Rhythm; without murmurs/ rubs/ or gallops detected... ABDOMEN:  Soft & nontender; normal bowel sounds; no organomegaly or masses palpated... EXT: without deformities, mild arthritic  changes; no varicose veins/ +venous insuffic/ no edema. BACK:  scar of prev lumbar laminectomies... SLR is neg... NEURO:  CN's intact; motor testing normal; sensory testing normal; gait normal & balance OK. DERM:  No lesions noted; no rash etc...  RADIOLOGY DATA:  Reviewed in the EPIC EMR & discussed w/ the patient...  LABORATORY DATA:  Reviewed in the EPIC EMR & discussed w/ the patient...   Assessment & Plan:   COPD>  He continues to smoke 1/2ppd & not interested in smoking cessation help etc;  Neither is he interested in any breathing medication "my breathing is fine, no problem"...  Hx PAF>  This was transient & related to his DTs & bilat pneumonia during the 5/11 hosp;  He remains on Diltiazem & holding NSR;  DrBrackbill's consult note is reviewed...  Hx DVT>  He's was on Coumadin x1y & f/u ven dopplers 5/12 was neg= therefore Coumadin stopped; he denies any prob w/ DVT, leg swelling, etc; he knows to avoid Etoh, avoid leg trauma, stay active, etc...  CHOL>  He needs to ret for fasting lipid profile follow up...  GERD>  He is rec to take either PRILOSEC 20mg  OTC daily or PEPCID 20mg  daily for mucosal protection in light of his chr alcoholism...  Etoh>  He is advised that it is NOT ok to drink beer or wine & that he is a chronic alcoholic so it is not acceptable to consume any alcohol & that he desperately needs to join AA etc...  Urinary Freq>  Given trial Enablex w/o benefit; try FLOMAX 0.4mg /d at this point & next option is Urology referral...  BACK  PAIN>  S/p mult surg & DrCohen plans more surg for HNP w/ spinal stenosis...  Anxiety/ Insomnia>  As noted...  NOTE> I spent more than reviewing hx, avail data, current assessment, & EPIC update note for pre-op clearance, and coordinating care...   Patient's Medications  New Prescriptions   TAMSULOSIN HCL (FLOMAX) 0.4 MG CAPS    Take 1 capsule (0.4 mg total) by mouth daily.  Previous Medications   ASPIRIN 81 MG TABLET     Take 81 mg by mouth daily.   DILTIAZEM (CARDIZEM CD) 180 MG 24 HR CAPSULE    TAKE 1 CAPSULE BY MOUTH ONCE A DAY   GABAPENTIN (NEURONTIN) 300 MG CAPSULE    Take 1 capsule by mouth Three times a day.   MULTIPLE VITAMINS-MINERALS (MENS MULTIVITAMIN PLUS PO)    Take by mouth. Once a day   Modified Medications   No medications on file  Discontinued Medications   FOLIC ACID (FOLVITE) 1 MG TABLET    TAKE 1 TABLET BY MOUTH EVERY DAY   LORATADINE (CLARITIN) 10 MG TABLET    Take 10 mg by mouth daily.     OXYBUTYNIN (DITROPAN) 5 MG TABLET    Take 1 tablet (5 mg total) by mouth 2 (two) times daily.   TRAMADOL (ULTRAM) 50 MG TABLET    Take 50 mg by mouth every 6 (six) hours as needed.     WARFARIN (COUMADIN) 5 MG TABLET    TAKE 1 TABLET BY MOUTH AS DIRECTED.

## 2011-08-26 NOTE — Patient Instructions (Addendum)
Today we updated your med list in our EPIC system...    Continue your current medications the same...  We added TAMSULOSIN 0.4mg  one tab daily to help your urination...  Today we did your follow up CXR & blood work...    We will call you w/ the results...  We will communicate w/ DrCohen & give him the go ahead for your back surgery...  Call for any questions.Kurt KitchenMarland [name]

## 2011-08-27 ENCOUNTER — Telehealth: Payer: Self-pay | Admitting: Pulmonary Disease

## 2011-08-27 NOTE — Telephone Encounter (Signed)
Notes Recorded by Michele Mcalpine, MD on 08/27/2011 at 7:56 AM Please notify patient>  Chems/ CBC/ Thyroid/ PSA> all WNL... REC> quit drinking, quit smoking!!! --I spoke with patient about results and he verbalized understanding and had no questions. Pt is requesting his cxr results. Please advise SN thanks   i have printed off Report to go with message

## 2011-08-27 NOTE — Telephone Encounter (Signed)
Per SN: cxr was ok > COPD from smoking.  No nodules or lesions.  Recommendation is to quit all smoking!  Thanks.

## 2011-08-27 NOTE — Telephone Encounter (Signed)
I spoke with patient about results and he verbalized understanding and had no questions 

## 2011-09-02 ENCOUNTER — Telehealth: Payer: Self-pay | Admitting: Pulmonary Disease

## 2011-09-02 NOTE — Telephone Encounter (Signed)
This should have been faxed over to Dr. Noel Gerold for surgical clearance.    thanks

## 2011-09-02 NOTE — Telephone Encounter (Signed)
Per leigh libby is going to refax the packet over to Dr. Noel Gerold

## 2011-09-02 NOTE — Telephone Encounter (Signed)
I spoke with Kurt [name] and she states she received ov note from [facility] 08/26/11 on pt this morning. Is pt needing an appt?. I don't see a referral made. Please advise leigh if you faxed this over thanks

## 2011-09-04 ENCOUNTER — Telehealth: Payer: Self-pay | Admitting: Pulmonary Disease

## 2011-09-04 NOTE — Telephone Encounter (Signed)
Looked through [employer] did not see anything. I called Dr. Wells Guiles office to see what they need. Called [phone]. I had to leave a message for jennifer tcb to see what they need [place] to fax over

## 2011-09-04 NOTE — Telephone Encounter (Signed)
I am not sure if there was a form with the OV notes or not.   Libby did fax all of those yesterday.  thanks

## 2011-09-04 NOTE — Telephone Encounter (Signed)
Leigh do you know if this was a form or office notes? Alida ( covering for libby today) looked in Libby's shred and on fax and did not see anything on this pt. Please advise thanks

## 2011-09-05 NOTE — Telephone Encounter (Signed)
Called, spoke with Inetta Fermo with [facility], [place].  States they did receive what they needed.  Will call back if anything further is needed.

## 2011-09-05 NOTE — Telephone Encounter (Signed)
Kurt [name] called this morning states she believed they received what they needed yesterday will check and call back if needed.[name]

## 2011-11-10 ENCOUNTER — Other Ambulatory Visit: Payer: Self-pay | Admitting: Pulmonary Disease

## 2012-01-14 ENCOUNTER — Telehealth: Payer: Self-pay | Admitting: Pulmonary Disease

## 2012-01-14 NOTE — Telephone Encounter (Signed)
Pt stated to call his cell first which is [phone] if no answer call [phone]

## 2012-01-14 NOTE — Telephone Encounter (Signed)
Spoke with pt. He is c/o weakness and feeling anxious recently. OV with TP for tomorrow at 11:30 am. Nothing further needed.

## 2012-01-15 ENCOUNTER — Ambulatory Visit (INDEPENDENT_AMBULATORY_CARE_PROVIDER_SITE_OTHER): Payer: Medicare Other | Admitting: Adult Health

## 2012-01-15 ENCOUNTER — Encounter: Payer: Self-pay | Admitting: Adult Health

## 2012-01-15 ENCOUNTER — Other Ambulatory Visit (INDEPENDENT_AMBULATORY_CARE_PROVIDER_SITE_OTHER): Payer: Medicare Other

## 2012-01-15 VITALS — BP 122/80 | HR 91 | Temp 98.8°F | Ht 70.0 in | Wt 164.0 lb

## 2012-01-15 DIAGNOSIS — F3289 Other specified depressive episodes: Secondary | ICD-10-CM

## 2012-01-15 DIAGNOSIS — F329 Major depressive disorder, single episode, unspecified: Secondary | ICD-10-CM

## 2012-01-15 DIAGNOSIS — R5381 Other malaise: Secondary | ICD-10-CM

## 2012-01-15 DIAGNOSIS — R5383 Other fatigue: Secondary | ICD-10-CM

## 2012-01-15 LAB — CBC WITH DIFFERENTIAL/PLATELET
Basophils Relative: 0.7 % (ref 0.0–3.0)
Eosinophils Relative: 0.8 % (ref 0.0–5.0)
Lymphocytes Relative: 29.1 % (ref 12.0–46.0)
MCV: 100.7 fl — ABNORMAL HIGH (ref 78.0–100.0)
Monocytes Relative: 15.2 % — ABNORMAL HIGH (ref 3.0–12.0)
Neutrophils Relative %: 54.2 % (ref 43.0–77.0)
RBC: 4.32 Mil/uL (ref 4.22–5.81)
WBC: 4.5 10*3/uL (ref 4.5–10.5)

## 2012-01-15 MED ORDER — DULOXETINE HCL 30 MG PO CPEP: 30.0000 mg | ORAL_CAPSULE | Freq: Every day | ORAL | Status: DC

## 2012-01-15 NOTE — Patient Instructions (Addendum)
Begin Cymbalta 30mg  daily  Cut back to stop alcohol and smoking.  I will call with lab results.  Please contact office for sooner follow up if symptoms do not improve or worsen or seek emergency care  follow up Dr. Kriste Basque  In 6-8 weeks and As needed

## 2012-01-17 ENCOUNTER — Other Ambulatory Visit (INDEPENDENT_AMBULATORY_CARE_PROVIDER_SITE_OTHER): Payer: Medicare Other

## 2012-01-17 DIAGNOSIS — R5383 Other fatigue: Secondary | ICD-10-CM

## 2012-01-17 DIAGNOSIS — R5381 Other malaise: Secondary | ICD-10-CM

## 2012-01-17 LAB — HEPATIC FUNCTION PANEL
ALT: 37 U/L (ref 0–53)
AST: 49 U/L — ABNORMAL HIGH (ref 0–37)
Alkaline Phosphatase: 81 U/L (ref 39–117)
Bilirubin, Direct: 0.1 mg/dL (ref 0.0–0.3)
Total Bilirubin: 0.7 mg/dL (ref 0.3–1.2)
Total Protein: 7.8 g/dL (ref 6.0–8.3)

## 2012-01-21 DIAGNOSIS — F329 Major depressive disorder, single episode, unspecified: Secondary | ICD-10-CM | POA: Insufficient documentation

## 2012-01-21 DIAGNOSIS — F32A Depression, unspecified: Secondary | ICD-10-CM | POA: Insufficient documentation

## 2012-01-21 NOTE — Assessment & Plan Note (Signed)
Depression  Advised on stress reducers.  Declined counseling referral  Advised on no etoh use and health lifestyle changes.   Plan  Check labs  Begin Cymbalta 30mg  daily  Cut back to stop alcohol and smoking.  Please contact office for sooner follow up if symptoms do not improve or worsen or seek emergency care  follow up Dr. Kriste Basque  In 6-8 weeks and As needed

## 2012-01-21 NOTE — Progress Notes (Signed)
Subjective:    Patient ID: Kurt [name], male    DOB: [birth year], 71 y.o.   MRN: [id number]  HPI 71 y/o BM here for a follow up visit... he has multiple medical problems as noted below...    ~  October 31, 2009:  he was hosp 5/11 by [facility] w/ Etoh withdrawal & DTs, bilat pneumonia w/ resp insuffic, AFib, DVT, & Prevotella bacteremia (seen by ID & Rx w/ Invanz IV... disch to NHP & eventually back home in July after he finished his Antibiotic & Lovenox transitioned to Coumadin (followed in the CC)... he notes "weak", sour taste in mouth, increased urination, back pain, etc... he states he has quit drinking & smoking... we will Rx w/ Tramadol for pain... extensive records from [facility] & f/u have been reviewed w/ pt.  ~  March 29, 2010:  he saw DrStern 12/11 & "I've got a whole lot of arthritis in my back"> note reviewed & he mentioned no neuro deficits & rec for Voltaren Gel for his LBP (hx 2 prev surgeries- Lumbar Lam & disckectomy in 1985 & 1990)...  he states he is not drinking at present but still smoking 1/2 ppd & notes min cough, sputum, drainage, dyspnea> rec Mucinex, Claritin, nasal saline Prn... he is on Coumadin for his prev DVT & PAF> followed in the CC & doing satis w/o CP, palpit, dizzy, syncope, etc... we will f/u CXR (COPD, DJD sp, NAD), and non-fasting labs (all essen WNL x VitD=19 & rec for OTC daily supplement... OK PNEUMOVAX today.  ~  May 30,2012:  51mo ROV & needs decision regarding anticoagulation in advance of planned back surg by DrStern> MRI Lumbar Spine 4/12 showed multilevel degenerative changes severe spinal stenosis L3-4 due to central disc protrusion (prior decompression on right at L4-5 & left at L5-S1 are unchanged)...  He has hx chronic alcoholism & Etoh withdrawal w/ DTs 5/11> during that hospitalization he had transient AFib & was seen by DrBrackbill for Cards, started on Diltiazem, & converted to NSR (no prob since then);  He also had bilat LE DVT & was treated w/ Lovenox  ==> Coumadin & has been followed in the Coumadin Clinic now for 56yr; he denies leg pain or swelling etc==> we discussed checking LE VenDopplers & if they are OK then we will stop his Coumadin at this point- he understands that his risk for future DVT is elevated by virtue of his hx of prev DVT but he can minimize that risk by staying active, avoiding trauma, & stopping the alcohol consumption... In this regard he informs me that he is once again smoking 1+ppd & that he was told by the CC that it would be OK to drink a few beers daily if he wanted to- I informed him that it certainly was NOT OK to drink the beer or whatever because he is an alcoholic & this isn't acceptable (wife shaking her head the whole time)...  He denies CP, palpit, dizzy, syncope, SOB/DOE, edema, etc> EKG today shows NSR, & minor NSSTTWA.    Plan is to check VenDopplers & if neg then stop Coumadin & OK to proceed w/ back surgery from the medical standpoint> his greatest risk would appear to be from his alcoholism, continued drinking, risk of withdrawal & DTs;  From the DVT prevention standpoint he should be managed w/ PAS hose, early ambulation & perioperative Lovenox if endorsed by Neurosurgery...  ~  August 26, 2011:  48mo ROV & here for pre-op clearance for  back surg- apparently DrMCohen plans back surg, we don't have Ortho notes but pt states that "he guaranteed 10-12 yrs of being pain free after the operation"... His only other complaints are LTOS including difficulty emptying bladder, urgency, decr force of stream, etc> and we discussed starting FLOMAX 0.4mg  Qd...    COPD> still smoking 1/2ppd & not interested in smoking cessation help; denies cough, sputum, hemoptysis, SOB, etc...    Hx PAF> occurred 5/11 in hosp for pneumonia & etoh withdrawal; converted to NSR on CardizemCD 180mg /d & ASA 81mg /d; remains in NSR w/o CP, palpit, edema...    Hx DVT> occurred 5/11 in hosp for pneumonia & etoh withdrawal> treated w/ Coumadin x 74yr &  stopped 5/12 w/ neg f/u ven dopplers...    Chol> on diet alone, he has refused meds in the past; last FLP on diet was improved w/ LDL=112; needs to ret fasting for f/u FLP...    GI> HYx GERD on OTC Pepcid prn; Hx divertics/ polyps & overdue for f/u colonoscopy; he denies abd pain, n/v, c/d, blood, etc...    Alcoholism> he still drinks daily- admits to 1 can of beer & 1pt of wine ([place] or [employer]) daily; hx of DT's during hosp 5/11 for pneumonia...    GU> LTOS persist despite his saw palmetto rx; tried Enablex 2012 w/o benefit & he stopped; rec FLOMAX 0.4mg  daily...    Neck & Back pain> he has CSpine spondylosis w/ evals from DrStern; and 3 separate lumbar surgeries over the yrs- known multilevel DDD w/ foraminal stenosis & spinal stenosis at each level (severe at L3-4); he states leg pain L>R; he had planned surg 2012 from    DrStern but he decided to pursue Chiropractic care which he said helped for awhile, then worse, & now seeing DrCohen who plans surg soon (we don't have notes from him)...    Anxiety/ Insomnia> not currently on meds due to his drinking... We reviewed prob list, meds, xrays and labs> see below for updates >> EKG 5/12 showed NSR, rate80, WNL, NAD... CXR 7/13 showed COPD w/ scarring at the bases, NAD, no CHF or effusion.Marland Kitchen LABS 7/13:  Chems- wnl;  CBC- wnl;  TSH=1.16;  PSA=0.41  01/15/12 Acute OV  Complains of mulitple complaints: weakness/fatigue x1 year, anxiety/stress at home > wife has alzheimer's Pt complains that his stress level has increased over last 1 year.  Wife is getting worse with dementia. He feels depressed  Was seen by home nurse via insurance and advised to that cymbalta may help. As he has  Chronic pain with arthritis in neck and back  With known multilevel DDD w/ foraminal stenosis an dspinal stenosis. He is on neurontin.  He does drink on daily basis w/ discussed cessation and cutting back on intake  Also advised on smoking cessation.  He denies  suicidal or homicidal ideation.  Last labs in 7/13 were essentially unremarkable. CXR w/ COPD changes no acute process.     Problem List:  COPD (ICD-496),  CIGARETTE SMOKER (ICD-305.1),  Hx of ABNORMAL CHEST XRAY (ICD-793.1) - +cig smoker & still smoking 1/2ppd (quit transiently around the time of his hosp 5/11)... hx + for cough & whitish sputum, w/ congestion and chr stable DOE...  denies hemoptysis, worsening dyspnea, wheezing, chest pains, snoring, daytime hypersomnolence, etc... not currently on any resp meds> ~  Baseline CXR shows COPD, chr interstitial markings, NAD.Marland Kitchen. (no prev CT Chest). ~  Prev PFT's 3/05 showed FVC= 3.58 (79%), FEV1= 3.02 (96%), %1sec=84,  mid-flows= 139%pred... lung volumes were normal and DLCO was WNL as well... ~  [facility] 5/11 w/ Etoh & bilat pneumonia w/ resp insuffic >> Disch summary reviewed. ~  CXR 2/12 showed COPD, DJD in spine, NAD.Marland Kitchen. ~  CXR 7/13 showed COPD w/ scarring at the bases, NAD, no CHF or effusion.Marland Kitchen  PAROXYSMAL ATRIAL FIBRILLATION (ICD-427.31) - he had AFib in hosp 5/11 due to DTs & bilat pneumonia- he converted to NSR on Cardizem, no prob since then... currently stable on DILTIAZEM CD 180mg /d & holding NSR...  PERIPHERAL VASCULAR DISEASE (ICD-443.9) - on ASA 81mg /d...  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  DVT (ICD-453.40) - venous dopplers pos for bilat DVT in hosp 5/11> not felt to be a good Coumadin candidate at the time & started on Lovenox, disch to NHP & eventually converted to COUMADIN- followed in the Coumadin Clinic for 68yr w/o problems... ~  5/12:  Repeat VenDiopplers ==> neg for DVT & Coumadin was discontinued... ~  7/13:  He reports no prob w/ recurrent DVT, leg swelling, etc... Exercise is lim by his LBP...  HYPERCHOLESTEROLEMIA (ICD-272.0) - on diet alone... ~  FLP 6/07 showed TChol 219, TG 56, HDL 80, LDL 135 ~  FLP 10/10 showed TChol 197, TG 58, HDL 73, LDL 112  GERD (ICD-530.81) - on OTC  Pepcid/ Zantac Prn... EGD in 1998 showed mild esophagitis... ~  5/12:  In light of his continued etoh- he is advised to take Prilosec vs Pepcid daily for mucosal protection...  DIVERTICULOSIS OF COLON (ICD-562.10),  COLONIC POLYPS (ICD-211.3) - last colonoscopy 7/05 by DrStark showed divertics & 5mm polyp= tubular adenoma... f/u planned 5 yrs & he is now overdue...  ALCOHOL ABUSE (ICD-305.00) - admits to daily beer consumption... intermittent mild incr SGOT in past... adm to hosp 5/11 e/ Etoh withdrawal & DTs> he stopped transiently after that... he does not voice understanding of alcoholism, & hasn't joined AA etc, & restarted "beer" stating that the Coumadin Clinic told him it was OK. ~  7/13:  Now states he is drinking 1 can beer/d and 1pt wine daily... Advised to quit completely & join AA.  URINARY FREQUENCY (ICD-788.41) - on Saw Palmetto supplement... he c/o freq and nocturia- no burning, no blood, mild LTOS... ~  labs 5/08 showed PSA= 0.70 ~  labs 10/10 showed PSA= 0.51 ~  5/12:  He requests med trial for the frequency> try ENABLEX 7.5mg  daily (f/u- he reports this wasn't helpful). ~  7/13:  He describes LTOS & we have rec trial FLOMAX 0.4mg  /d...  FATIGUE (ICD-780.79) - on MVI + Vit D 1000/d per daughter... this has been a chronic issue over the yrs...  NECK PAIN (ICD-723.1) - known cervical spondylosis w/ eval from DrRobinson in 90's and DrStern over the last 10 yrs...  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 last seen by DrStern 7/07 w/ multilevel lumbar DDD w/ foraminal stenosis and spinal stenosis at each level... currently c/o mod back pain, uses Tylenol Prn, mild assoc leg symptoms... ~  4/12:  Repeat eval by DrStern w/ MRI showing multilevel degenerative changes severe spinal stenosis L3-4 due to central disc protrusion (prior decompression on right at L4-5 & left at L5-S1 are unchanged); they planned surg  but pt chose to see Chiropractor & states the adjustments are helping... ~  7/13:  Pt reports the Chiro rx stopped helping & he sought second  opinion from DrCohen; we don't have notes from him, but pt states that "he guaranteed 10-12 yrs of being pain free after the operation"  ANXIETY (ICD-300.00) - not currently on meds...  INSOMNIA, CHRONIC (ICD-307.42) - not currently on meds...   Past Surgical History  Procedure Date  . 3 seperate lumbar laminectomies   . Right cts release     Dr. Merlyn Lot 12/00    Outpatient Encounter Prescriptions as of 01/15/2012  Medication Sig Dispense Refill  . aspirin 81 MG tablet Take 81 mg by mouth daily.      Marland Kitchen diltiazem (CARDIZEM CD) 180 MG 24 hr capsule TAKE 1 CAPSULE BY MOUTH ONCE A DAY  30 capsule  11  . DULoxetine (CYMBALTA) 30 MG capsule Take 1 capsule (30 mg total) by mouth daily.  30 capsule  5  . gabapentin (NEURONTIN) 300 MG capsule Take 1 capsule by mouth Three times a day.      . Multiple Vitamins-Minerals (MENS MULTIVITAMIN PLUS PO) Take by mouth. Once a day       . Tamsulosin HCl (FLOMAX) 0.4 MG CAPS Take 1 capsule (0.4 mg total) by mouth daily.  30 capsule  5    No Known Allergies   Current Medications, Allergies, Past Medical History, Past Surgical History, Family History, and Social History were reviewed in [employer] record.   Review of Systems         See HPI -  Constitutional:   No  weight loss, night sweats,  Fevers, chills,  +fatigue, or  lassitude.  HEENT:   No headaches,  Difficulty swallowing,  Tooth/dental problems, or  Sore throat,                No sneezing, itching, ear ache, nasal congestion, post nasal drip,   CV:  No chest pain,  Orthopnea, PND, swelling in lower extremities, anasarca, dizziness, palpitations, syncope.   GI  No heartburn, indigestion, abdominal pain, nausea, vomiting, diarrhea, change in bowel habits, loss of appetite, bloody stools.   Resp   No excess mucus, no  productive cough,  No non-productive cough,  No coughing up of blood.  No change in color of mucus.  No wheezing.  No chest wall deformity  Skin: no rash or lesions.  GU: no dysuria, change in color of urine, no urgency or frequency.  No flank pain, no hematuria   MS:  + back pain.  Psych:  No change in mood or affect. No depression or anxiety.  No memory loss.       Objective:   Physical Exam    WD, WN, 70 y/o BM in NAD... GENERAL:  Alert & oriented; pleasant & cooperative... HEENT:  [place]/AT, EOM-wnl, PERRLA, EACs-clear, TMs-wnl, NOSE-clear, THROAT-clear & wnl. NECK:  Supple w/ fairROM; no JVD; normal carotid impulses w/o bruits; no thyromegaly or nodules palpated; no lymphadenopathy. CHEST:  Clear to P & A; without wheezes/ rales/ or rhonchi heard... HEART:  Regular Rhythm; without murmurs/ rubs/ or gallops detected... ABDOMEN:  Soft & nontender; normal bowel sounds; no organomegaly or masses palpated... EXT: without deformities, mild arthritic changes; no varicose veins/ +venous insuffic/ no edema. NEURO:  CN's intact; motor testing normal; sensory testing normal; gait normal & balance OK. DERM:  No lesions noted; no rash etc...   Assessment & Plan:

## 2012-01-22 NOTE — Progress Notes (Signed)
Quick Note:  Called spoke with patient, advised of lab results / recs as stated by TP. Pt verbalized his understanding and denied any questions. ______ 

## 2012-02-12 ENCOUNTER — Telehealth: Payer: Self-pay | Admitting: Pulmonary Disease

## 2012-02-12 NOTE — Telephone Encounter (Signed)
ATC pt NA unable to leave VM WCB 

## 2012-02-13 NOTE — Telephone Encounter (Signed)
Per SN---he can get the venlafaxine 75 mg  1/2 bid from [facility] for a 90 days supply for $65 and they would charge him $5 for shipping.  Or we can send this to his local pharmacy to see what their price would be.  thanks

## 2012-02-13 NOTE — Telephone Encounter (Signed)
ATC pt NA unable to leave VM WCB 

## 2012-02-13 NOTE — Telephone Encounter (Signed)
I spoke with the pt and he states his cymbalta cost increased and he is asking for a generic alternative. Please advise. Carron Curie, CMA No Known Allergies

## 2012-02-13 NOTE — Telephone Encounter (Signed)
Pt is aware. Even though it is generic it is still $45 d/t him having medicare/deductible/or it being a non preferred drug even if it is generic (called and verified this with the pharmacy). Please advise SN thanks

## 2012-02-13 NOTE — Telephone Encounter (Signed)
cymbalta is now a generic.  The pt will need to request that this be changed at the pharmacy.  thanks

## 2012-02-14 NOTE — Telephone Encounter (Signed)
ATC PT NA unable to leave VM WCB 

## 2012-02-17 ENCOUNTER — Telehealth: Payer: Self-pay | Admitting: Pulmonary Disease

## 2012-02-17 NOTE — Telephone Encounter (Signed)
Gweneth Dimitri, RN 02/17/2012 8:34 AM Signed  Called, spoke with pt. Informed him of below per Dr. Kriste Basque. He verbalized understanding of this and would like for me to call [facility] with the new rx to see how much this would be then call him back so he can make his decision.  Called, spoke with Melanie at [facility] on [address] Per Shawna Orleans, the venlafaxine 75mg  1/2bid will be $45 for a 30 day supply and $135 for a 90 day supply. She will put on hold until we call her back to let her know what to do with it as pt is unsure.  Called, spoke with pt. Informed him of the cost above per [facility]. Pt states he would like [place] to speak with his daughter regarding this and regarding having the rx sent to [facility]. Pt states he will have his daughter call us back. Will await call. Note: Shawna Orleans with [facility] is aware we are awaiting pt's daughter to call back for a decision. She verbalized understanding and will keep rx on hold for now.  ------  lmomtcb x1 for vdaughter

## 2012-02-17 NOTE — Telephone Encounter (Signed)
Called, spoke with pt.  Informed him of below per Dr. Kriste Basque.  He verbalized understanding of this and would like for me to call [facility] with the new rx to see how much this would be then call him back so he can make his decision.  Called, spoke with Melanie at [facility] on [address]  Per Shawna Orleans, the venlafaxine 75mg  1/2bid will be $45 for a 30 day supply and $135 for a 90 day supply.  She will put on hold until we call her back to let her know what to do with it as pt is unsure.  Called, spoke with pt.  Informed him of the cost above per [facility].  Pt states he would like [place] to speak with his daughter regarding this and regarding having the rx sent to [facility].  Pt states he will have his daughter call us back.  Will await call.  Note:  Shawna Orleans with [facility] is aware we are awaiting pt's daughter to call back for a decision.  She verbalized understanding and will keep rx on hold for now.

## 2012-02-19 MED ORDER — DULOXETINE HCL 30 MG PO CPEP: 30.0000 mg | ORAL_CAPSULE | Freq: Every day | ORAL | Status: DC

## 2012-02-19 NOTE — Telephone Encounter (Signed)
Duplicate, will close and continue on 02-12-12 message. Carron Curie, CMA

## 2012-02-19 NOTE — Telephone Encounter (Signed)
Called, spoke with pt. Informed him of the cost above per [facility]. Pt states he would like [place] to speak with his daughter regarding this and regarding having the rx sent to [facility]. Pt states he will have his daughter call us back. Will await call. Note: Shawna Orleans with [facility] is aware we are awaiting pt's daughter to call back for a decision. She verbalized understanding and will keep rx on hold for now.  I spoke with the to daughter and advised her of options and she states ok to send rx to [facility] drug. Rx sent. She will inform the pt.  Carron Curie, CMA

## 2012-02-20 ENCOUNTER — Other Ambulatory Visit: Payer: Self-pay | Admitting: Pulmonary Disease

## 2012-08-01 ENCOUNTER — Other Ambulatory Visit: Payer: Self-pay | Admitting: Pulmonary Disease

## 2012-11-16 ENCOUNTER — Ambulatory Visit (INDEPENDENT_AMBULATORY_CARE_PROVIDER_SITE_OTHER): Payer: Medicare Other | Admitting: Adult Health

## 2012-11-16 ENCOUNTER — Telehealth: Payer: Self-pay | Admitting: Adult Health

## 2012-11-16 ENCOUNTER — Encounter: Payer: Self-pay | Admitting: Adult Health

## 2012-11-16 ENCOUNTER — Other Ambulatory Visit (INDEPENDENT_AMBULATORY_CARE_PROVIDER_SITE_OTHER): Payer: Medicare Other

## 2012-11-16 VITALS — BP 112/74 | HR 66 | Temp 97.7°F | Ht 71.0 in | Wt 168.8 lb

## 2012-11-16 DIAGNOSIS — F411 Generalized anxiety disorder: Secondary | ICD-10-CM

## 2012-11-16 DIAGNOSIS — R5381 Other malaise: Secondary | ICD-10-CM

## 2012-11-16 DIAGNOSIS — E7800 Pure hypercholesterolemia, unspecified: Secondary | ICD-10-CM

## 2012-11-16 DIAGNOSIS — M47817 Spondylosis without myelopathy or radiculopathy, lumbosacral region: Secondary | ICD-10-CM

## 2012-11-16 DIAGNOSIS — M542 Cervicalgia: Secondary | ICD-10-CM

## 2012-11-16 LAB — CBC WITH DIFFERENTIAL/PLATELET
Eosinophils Absolute: 0.1 10*3/uL (ref 0.0–0.7)
Eosinophils Relative: 2 % (ref 0.0–5.0)
HCT: 42.8 % (ref 39.0–52.0)
Hemoglobin: 14.7 g/dL (ref 13.0–17.0)
Lymphs Abs: 1.2 10*3/uL (ref 0.7–4.0)
MCHC: 34.4 g/dL (ref 30.0–36.0)
MCV: 97.1 fl (ref 78.0–100.0)
Monocytes Absolute: 0.5 10*3/uL (ref 0.1–1.0)
Neutrophils Relative %: 60 % (ref 43.0–77.0)
Platelets: 261 10*3/uL (ref 150.0–400.0)
RBC: 4.41 Mil/uL (ref 4.22–5.81)

## 2012-11-16 LAB — BASIC METABOLIC PANEL
BUN: 12 mg/dL (ref 6–23)
Calcium: 8.9 mg/dL (ref 8.4–10.5)
Creatinine, Ser: 1 mg/dL (ref 0.4–1.5)
GFR: 99.72 mL/min (ref >60.00)
Glucose, Bld: 104 mg/dL — ABNORMAL HIGH (ref 70–99)
Potassium: 4 mEq/L (ref 3.5–5.1)
Sodium: 140 mEq/L (ref 135–145)

## 2012-11-16 LAB — TSH: TSH: 1.63 u[IU]/mL (ref 0.35–5.50)

## 2012-11-16 LAB — HEPATIC FUNCTION PANEL
AST: 24 U/L (ref 0–37)
Albumin: 3.9 g/dL (ref 3.5–5.2)
Alkaline Phosphatase: 67 U/L (ref 39–117)
Bilirubin, Direct: 0.1 mg/dL (ref 0.0–0.3)
Total Bilirubin: 0.4 mg/dL (ref 0.3–1.2)

## 2012-11-16 MED ORDER — TRAMADOL HCL 50 MG PO TABS: 50.0000 mg | ORAL_TABLET | Freq: Three times a day (TID) | ORAL | Status: DC | PRN

## 2012-11-16 NOTE — Assessment & Plan Note (Signed)
May use Meloxicam 15mg daily for arthritis  May use Tramadol 50mg every 6hrs as needed for pain, may make you sleepy.  Cut back to stop alcohol and smoking.  I will call with lab results.  Please contact office for sooner follow up if symptoms do not improve or worsen or seek emergency care  follow up Dr. Nadel  In 8 weeks and As needed    

## 2012-11-16 NOTE — Progress Notes (Signed)
Subjective:    Patient ID: Kurt [name], male    DOB: [birth year], 72 y.o.   MRN: [id number]  HPI 72 y/o BM     ~  October 31, 2009:  he was hosp 5/11 by [facility] w/ Etoh withdrawal & DTs, bilat pneumonia w/ resp insuffic, AFib, DVT, & Prevotella bacteremia (seen by ID & Rx w/ Invanz IV... disch to NHP & eventually back home in July after he finished his Antibiotic & Lovenox transitioned to Coumadin (followed in the CC)... he notes "weak", sour taste in mouth, increased urination, back pain, etc... he states he has quit drinking & smoking... we will Rx w/ Tramadol for pain... extensive records from [facility] & f/u have been reviewed w/ pt.  ~  March 29, 2010:  he saw DrStern 12/11 & "I've got a whole lot of arthritis in my back"> note reviewed & he mentioned no neuro deficits & rec for Voltaren Gel for his LBP (hx 2 prev surgeries- Lumbar Lam & disckectomy in 1985 & 1990)...  he states he is not drinking at present but still smoking 1/2 ppd & notes min cough, sputum, drainage, dyspnea> rec Mucinex, Claritin, nasal saline Prn... he is on Coumadin for his prev DVT & PAF> followed in the CC & doing satis w/o CP, palpit, dizzy, syncope, etc... we will f/u CXR (COPD, DJD sp, NAD), and non-fasting labs (all essen WNL x VitD=19 & rec for OTC daily supplement... OK PNEUMOVAX today.  ~  May 30,2012:  57mo ROV & needs decision regarding anticoagulation in advance of planned back surg by DrStern> MRI Lumbar Spine 4/12 showed multilevel degenerative changes severe spinal stenosis L3-4 due to central disc protrusion (prior decompression on right at L4-5 & left at L5-S1 are unchanged)...  He has hx chronic alcoholism & Etoh withdrawal w/ DTs 5/11> during that hospitalization he had transient AFib & was seen by DrBrackbill for Cards, started on Diltiazem, & converted to NSR (no prob since then);  He also had bilat LE DVT & was treated w/ Lovenox ==> Coumadin & has been followed in the Coumadin Clinic now for 47yr; he denies  leg pain or swelling etc==> we discussed checking LE VenDopplers & if they are OK then we will stop his Coumadin at this point- he understands that his risk for future DVT is elevated by virtue of his hx of prev DVT but he can minimize that risk by staying active, avoiding trauma, & stopping the alcohol consumption... In this regard he informs me that he is once again smoking 1+ppd & that he was told by the CC that it would be OK to drink a few beers daily if he wanted to- I informed him that it certainly was NOT OK to drink the beer or whatever because he is an alcoholic & this isn't acceptable (wife shaking her head the whole time)...  He denies CP, palpit, dizzy, syncope, SOB/DOE, edema, etc> EKG today shows NSR, & minor NSSTTWA.    Plan is to check VenDopplers & if neg then stop Coumadin & OK to proceed w/ back surgery from the medical standpoint> his greatest risk would appear to be from his alcoholism, continued drinking, risk of withdrawal & DTs;  From the DVT prevention standpoint he should be managed w/ PAS hose, early ambulation & perioperative Lovenox if endorsed by Neurosurgery...  ~  August 26, 2011:  81mo ROV & here for pre-op clearance for back surg- apparently DrMCohen plans back surg, we don't have Ortho notes but  pt states that "he guaranteed 10-12 yrs of being pain free after the operation"... His only other complaints are LTOS including difficulty emptying bladder, urgency, decr force of stream, etc> and we discussed starting FLOMAX 0.4mg  Qd...    COPD> still smoking 1/2ppd & not interested in smoking cessation help; denies cough, sputum, hemoptysis, SOB, etc...    Hx PAF> occurred 5/11 in hosp for pneumonia & etoh withdrawal; converted to NSR on CardizemCD 180mg /d & ASA 81mg /d; remains in NSR w/o CP, palpit, edema...    Hx DVT> occurred 5/11 in hosp for pneumonia & etoh withdrawal> treated w/ Coumadin x 73yr & stopped 5/12 w/ neg f/u ven dopplers...    Chol> on diet alone, he has refused  meds in the past; last FLP on diet was improved w/ LDL=112; needs to ret fasting for f/u FLP...    GI> HYx GERD on OTC Pepcid prn; Hx divertics/ polyps & overdue for f/u colonoscopy; he denies abd pain, n/v, c/d, blood, etc...    Alcoholism> he still drinks daily- admits to 1 can of beer & 1pt of wine ([place] or [employer]) daily; hx of DT's during hosp 5/11 for pneumonia...    GU> LTOS persist despite his saw palmetto rx; tried Enablex 2012 w/o benefit & he stopped; rec FLOMAX 0.4mg  daily...    Neck & Back pain> he has CSpine spondylosis w/ evals from DrStern; and 3 separate lumbar surgeries over the yrs- known multilevel DDD w/ foraminal stenosis & spinal stenosis at each level (severe at L3-4); he states leg pain L>R; he had planned surg 2012 from    DrStern but he decided to pursue Chiropractic care which he said helped for awhile, then worse, & now seeing DrCohen who plans surg soon (we don't have notes from him)...    Anxiety/ Insomnia> not currently on meds due to his drinking... We reviewed prob list, meds, xrays and labs> see below for updates >> EKG 5/12 showed NSR, rate80, WNL, NAD... CXR 7/13 showed COPD w/ scarring at the bases, NAD, no CHF or effusion.Marland Kitchen LABS 7/13:  Chems- wnl;  CBC- wnl;  TSH=1.16;  PSA=0.41  01/15/12 Acute OV  Complains of mulitple complaints: weakness/fatigue x1 year, anxiety/stress at home > wife has alzheimer's Pt complains that his stress level has increased over last 1 year.  Wife is getting worse with dementia. He feels depressed  Was seen by home nurse via insurance and advised to that cymbalta may help. As he has  Chronic pain with arthritis in neck and back  With known multilevel DDD w/ foraminal stenosis an dspinal stenosis. He is on neurontin.  He does drink on daily basis w/ discussed cessation and cutting back on intake  Also advised on smoking cessation.  He denies suicidal or homicidal ideation.  Last labs in 7/13 were essentially  unremarkable. CXR w/ COPD changes no acute process.  >>cymbalta rx   11/16/2012 Acute OV  Complains of worse shoulder, back arthritis , stiffness, wants something for pain. He has chronic pain with arthritis pain in ncek and back . Has daily stiffness esp in am.  With known multilevel DDD w/ foraminal stenosis an dspinal stenosis. He is on neurontin.  Taking advil and mobic on/off.  No radicular symptoms or ext weakness.   Does still drink etoh and smoke daily , cessation discussed .  Unclear if he is taking meds correctly.  Did not keep follow up as directed, was started cymbalta last ov but did not start.  Problem List:  COPD (ICD-496),  CIGARETTE SMOKER (ICD-305.1),  Hx of ABNORMAL CHEST XRAY (ICD-793.1) - +cig smoker & still smoking 1/2ppd (quit transiently around the time of his hosp 5/11)... hx + for cough & whitish sputum, w/ congestion and chr stable DOE...  denies hemoptysis, worsening dyspnea, wheezing, chest pains, snoring, daytime hypersomnolence, etc... not currently on any resp meds> ~  Baseline CXR shows COPD, chr interstitial markings, NAD.Marland Kitchen. (no prev CT Chest). ~  Prev PFT's 3/05 showed FVC= 3.58 (79%), FEV1= 3.02 (96%), %1sec=84, mid-flows= 139%pred... lung volumes were normal and DLCO was WNL as well... ~  [facility] 5/11 w/ Etoh & bilat pneumonia w/ resp insuffic >> Disch summary reviewed. ~  CXR 2/12 showed COPD, DJD in spine, NAD.Marland Kitchen. ~  CXR 7/13 showed COPD w/ scarring at the bases, NAD, no CHF or effusion.Marland Kitchen  PAROXYSMAL ATRIAL FIBRILLATION (ICD-427.31) - he had AFib in hosp 5/11 due to DTs & bilat pneumonia- he converted to NSR on Cardizem, no prob since then... currently stable on DILTIAZEM CD 180mg /d & holding NSR...  PERIPHERAL VASCULAR DISEASE (ICD-443.9) - on ASA 81mg /d...  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  DVT (ICD-453.40) - venous dopplers pos for bilat DVT in hosp 5/11> not felt to be a good Coumadin candidate at the  time & started on Lovenox, disch to NHP & eventually converted to COUMADIN- followed in the Coumadin Clinic for 66yr w/o problems... ~  5/12:  Repeat VenDiopplers ==> neg for DVT & Coumadin was discontinued... ~  7/13:  He reports no prob w/ recurrent DVT, leg swelling, etc... Exercise is lim by his LBP...  HYPERCHOLESTEROLEMIA (ICD-272.0) - on diet alone... ~  FLP 6/07 showed TChol 219, TG 56, HDL 80, LDL 135 ~  FLP 10/10 showed TChol 197, TG 58, HDL 73, LDL 112  GERD (ICD-530.81) - on OTC Pepcid/ Zantac Prn... EGD in 1998 showed mild esophagitis... ~  5/12:  In light of his continued etoh- he is advised to take Prilosec vs Pepcid daily for mucosal protection...  DIVERTICULOSIS OF COLON (ICD-562.10),  COLONIC POLYPS (ICD-211.3) - last colonoscopy 7/05 by DrStark showed divertics & 5mm polyp= tubular adenoma... f/u planned 5 yrs & he is now overdue...  ALCOHOL ABUSE (ICD-305.00) - admits to daily beer consumption... intermittent mild incr SGOT in past... adm to hosp 5/11 e/ Etoh withdrawal & DTs> he stopped transiently after that... he does not voice understanding of alcoholism, & hasn't joined AA etc, & restarted "beer" stating that the Coumadin Clinic told him it was OK. ~  7/13:  Now states he is drinking 1 can beer/d and 1pt wine daily... Advised to quit completely & join AA.  URINARY FREQUENCY (ICD-788.41) - on Saw Palmetto supplement... he c/o freq and nocturia- no burning, no blood, mild LTOS... ~  labs 5/08 showed PSA= 0.70 ~  labs 10/10 showed PSA= 0.51 ~  5/12:  He requests med trial for the frequency> try ENABLEX 7.5mg  daily (f/u- he reports this wasn't helpful). ~  7/13:  He describes LTOS & we have rec trial FLOMAX 0.4mg  /d...  FATIGUE (ICD-780.79) - on MVI + Vit D 1000/d per daughter... this has been a chronic issue over the yrs...  NECK PAIN (ICD-723.1) - known cervical spondylosis w/ eval from DrRobinson in 90's and DrStern over the last 10 yrs...  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 last seen by DrStern 7/07  w/ multilevel lumbar DDD w/ foraminal stenosis and spinal stenosis at each level... currently c/o mod back pain, uses Tylenol Prn, mild assoc leg symptoms... ~  4/12:  Repeat eval by DrStern w/ MRI showing multilevel degenerative changes severe spinal stenosis L3-4 due to central disc protrusion (prior decompression on right at L4-5 & left at L5-S1 are unchanged); they planned surg but pt chose to see Chiropractor & states the adjustments are helping... ~  7/13:  Pt reports the Chiro rx stopped helping & he sought second opinion from DrCohen; we don't have notes from him, but pt states that "he guaranteed 10-12 yrs of being pain free after the operation"  ANXIETY (ICD-300.00) - not currently on meds...  INSOMNIA, CHRONIC (ICD-307.42) - not currently on meds...   Past Surgical History  Procedure Laterality Date  . 3 seperate lumbar laminectomies    . Right cts release      Dr. Merlyn Lot 12/00    Outpatient Encounter Prescriptions as of 11/16/2012  Medication Sig Dispense Refill  . aspirin 81 MG tablet Take 81 mg by mouth daily.      Marland Kitchen diltiazem (CARDIZEM CD) 180 MG 24 hr capsule TAKE 1 CAPSULE BY MOUTH ONCE A DAY  30 capsule  11  . DULoxetine (CYMBALTA) 30 MG capsule Take 1 capsule (30 mg total) by mouth daily.  90 capsule  4  . fexofenadine (ALLEGRA) 180 MG tablet Take 180 mg by mouth daily.      Marland Kitchen gabapentin (NEURONTIN) 300 MG capsule Take 1 capsule by mouth Three times a day.      . ibuprofen (ADVIL,MOTRIN) 200 MG tablet Per bottle as needed      . meloxicam (MOBIC) 15 MG tablet Take 1 tablet by mouth daily.      . Multiple Vitamins-Minerals (MENS MULTIVITAMIN PLUS PO) Take by mouth. Once a day       . tamsulosin (FLOMAX) 0.4 MG CAPS TAKE 1 CAPSULE BY MOUTH DAILY.  30 capsule  2   No facility-administered encounter medications on file as of 11/16/2012.    No Known  Allergies   Current Medications, Allergies, Past Medical History, Past Surgical History, Family History, and Social History were reviewed in [employer] record.   Review of Systems         See HPI -  Constitutional:   No  weight loss, night sweats,  Fevers, chills,  +fatigue, or  lassitude.  HEENT:   No headaches,  Difficulty swallowing,  Tooth/dental problems, or  Sore throat,                No sneezing, itching, ear ache, nasal congestion, post nasal drip,   CV:  No chest pain,  Orthopnea, PND, swelling in lower extremities, anasarca, dizziness, palpitations, syncope.   GI  No heartburn, indigestion, abdominal pain, nausea, vomiting, diarrhea, change in bowel habits, loss of appetite, bloody stools.   Resp   No excess mucus, no productive cough,  No non-productive cough,  No coughing up of blood.  No change in color of mucus.  No wheezing.  No chest wall deformity  Skin: no rash or lesions.  GU: no dysuria, change in color of urine, no urgency or frequency.  No flank pain, no hematuria   MS:  + back pain.   Psych:  No change in mood or affect. No depression or anxiety.  No memory loss.       Objective:   Physical Exam    WD, WN, 71  y/o BM in NAD... GENERAL:  Alert & oriented; pleasant & cooperative... HEENT:  [place]/AT, EOM-wnl, PERRLA, EACs-clear, TMs-wnl, NOSE-clear, THROAT-clear & wnl. NECK:  Supple w/ fairROM; no JVD; normal carotid impulses w/o bruits; no thyromegaly or nodules palpated; no lymphadenopathy. CHEST:  Clear to P & A; without wheezes/ rales/ or rhonchi heard... HEART:  Regular Rhythm; without murmurs/ rubs/ or gallops detected... ABDOMEN:  Soft & nontender; normal bowel sounds; no organomegaly or masses palpated... EXT: without deformities, mild arthritic changes; no varicose veins/ +venous insuffic/ no edema., neg SLR. Equal strength bilaterally.  NEURO:  CN's intact; motor testing normal; sensory testing normal; gait normal &  balance OK.  DERM:  No lesions noted; no rash etc...   Assessment & Plan:

## 2012-11-16 NOTE — Patient Instructions (Signed)
May use Meloxicam 15mg  daily for arthritis  May use Tramadol 50mg  every 6hrs as needed for pain, may make you sleepy.  Cut back to stop alcohol and smoking.  I will call with lab results.  Please contact office for sooner follow up if symptoms do not improve or worsen or seek emergency care  follow up Dr. Kriste Basque  In 8 weeks and As needed

## 2012-11-16 NOTE — Assessment & Plan Note (Signed)
?   Etiology -suspect related to medications, depression , polysubstance abuse with smoking and etoh.   Plan  Labs pending

## 2012-11-17 NOTE — Telephone Encounter (Signed)
Pt just seen yesterday Called spoke with patient who stated that he is urinating more frequently esp at night but denies any dysuria, odor, color change Advised patient that d/t his flomax, this is not an abnormal issue Advised pt to decrease fluid intake in the evening/at bedtime to help decrease bathroom trips at night Pt okay with these recs and verbalized his understanding and will call back with any acute symptoms Nothing further needed; will sign off.

## 2012-11-18 ENCOUNTER — Telehealth: Payer: Self-pay | Admitting: Pulmonary Disease

## 2012-11-18 NOTE — Telephone Encounter (Signed)
I spoke with pt. He reports he has had an increase in weakness. He stated he wants to know what he can do or take to help gain strength or get more "pep in his step". He reports this has been going for a while now. He reports his blood work was fine. Please advise SN thanks Last OV 11/16/12 Pending 01/18/13 No Known Allergies

## 2012-11-18 NOTE — Telephone Encounter (Signed)
Per SN---  All labs were good recs for the pt to start on:  Mens formula MVI daily See if the health food store has something like ageless male to take as well.    Keep ROV with SN in December.  thanks

## 2012-11-18 NOTE — Telephone Encounter (Signed)
I called and made pt aware. Nothing further needed 

## 2012-11-18 NOTE — Progress Notes (Signed)
Quick Note:  Called spoke with patient, advised of lab results / recs as stated by TP. Pt verbalized his understanding and denied any questions. ______ 

## 2012-11-23 ENCOUNTER — Other Ambulatory Visit: Payer: Self-pay | Admitting: Pulmonary Disease

## 2012-12-12 ENCOUNTER — Other Ambulatory Visit: Payer: Self-pay | Admitting: Adult Health

## 2012-12-12 ENCOUNTER — Other Ambulatory Visit: Payer: Self-pay | Admitting: Pulmonary Disease

## 2012-12-16 NOTE — Telephone Encounter (Signed)
Received refill request for tramadol 50 mg. This was last refilled by TP on 11/16/12 #60 x0 refills 1 q8hrs prn pain. This was a new RX for pt. He has pending appt 01/18/13 w/ SN. Please advise Dr. Kriste Basque if okay to refill thanks

## 2013-01-11 ENCOUNTER — Other Ambulatory Visit: Payer: Self-pay | Admitting: Pulmonary Disease

## 2013-01-18 ENCOUNTER — Ambulatory Visit (INDEPENDENT_AMBULATORY_CARE_PROVIDER_SITE_OTHER): Payer: Medicare Other | Admitting: Pulmonary Disease

## 2013-01-18 ENCOUNTER — Encounter: Payer: Self-pay | Admitting: Pulmonary Disease

## 2013-01-18 VITALS — BP 104/62 | HR 66 | Temp 97.0°F | Ht 71.0 in | Wt 157.4 lb

## 2013-01-18 DIAGNOSIS — J449 Chronic obstructive pulmonary disease, unspecified: Secondary | ICD-10-CM

## 2013-01-18 DIAGNOSIS — E7800 Pure hypercholesterolemia, unspecified: Secondary | ICD-10-CM

## 2013-01-18 DIAGNOSIS — G4700 Insomnia, unspecified: Secondary | ICD-10-CM

## 2013-01-18 DIAGNOSIS — K219 Gastro-esophageal reflux disease without esophagitis: Secondary | ICD-10-CM

## 2013-01-18 DIAGNOSIS — K5730 Diverticulosis of large intestine without perforation or abscess without bleeding: Secondary | ICD-10-CM

## 2013-01-18 DIAGNOSIS — F1010 Alcohol abuse, uncomplicated: Secondary | ICD-10-CM

## 2013-01-18 DIAGNOSIS — I4891 Unspecified atrial fibrillation: Secondary | ICD-10-CM

## 2013-01-18 DIAGNOSIS — D126 Benign neoplasm of colon, unspecified: Secondary | ICD-10-CM

## 2013-01-18 DIAGNOSIS — R350 Frequency of micturition: Secondary | ICD-10-CM

## 2013-01-18 DIAGNOSIS — F411 Generalized anxiety disorder: Secondary | ICD-10-CM

## 2013-01-18 DIAGNOSIS — M47817 Spondylosis without myelopathy or radiculopathy, lumbosacral region: Secondary | ICD-10-CM

## 2013-01-18 DIAGNOSIS — I739 Peripheral vascular disease, unspecified: Secondary | ICD-10-CM

## 2013-01-18 DIAGNOSIS — F17200 Nicotine dependence, unspecified, uncomplicated: Secondary | ICD-10-CM

## 2013-01-18 MED ORDER — LORAZEPAM 1 MG PO TABS: ORAL_TABLET | ORAL | Status: DC

## 2013-01-18 NOTE — Progress Notes (Signed)
Subjective:    Patient ID: Kurt [name], male    DOB: Jan 11, 1941, 71 y.o.   MRN: [id number]  HPI 72 y/o BM here for a follow up visit... he has multiple medical problems as noted below...    ~  May 30,2012:  45mo ROV & needs decision regarding anticoagulation in advance of planned back surg by DrStern> MRI Lumbar Spine 4/12 showed multilevel degenerative changes severe spinal stenosis L3-4 due to central disc protrusion (prior decompression on right at L4-5 & left at L5-S1 are unchanged)...  He has hx chronic alcoholism & Etoh withdrawal w/ DTs 5/11> during that hospitalization he had transient AFib & was seen by DrBrackbill for Cards, started on Diltiazem, & converted to NSR (no prob since then);  He also had bilat LE DVT & was treated w/ Lovenox ==> Coumadin & has been followed in the [facility]madin Clinic now for 58yr; he denies leg pain or swelling etc==> we discussed checking LE VenDopplers & if they are OK then we will stop his Coumadin at this point- he understands that his risk for future DVT is elevated by virtue of his hx of prev DVT but he can minimize that risk by staying active, avoiding trauma, & stopping the alcohol consumption... In this regard he informs me that he is once again smoking 1+ppd & that he was told by the CC that it would be OK to drink a few beers daily if he wanted to- I informed him that it certainly was NOT OK to drink the beer or whatever because he is an alcoholic & this isn't acceptable (wife shaking her head the whole time)...  He denies CP, palpit, dizzy, syncope, SOB/DOE, edema, etc> EKG today shows NSR, & minor NSSTTWA.    Plan is to check VenDopplers & if neg then stop Coumadin & OK to proceed w/ back surgery from the medical standpoint> his greatest risk would appear to be from his alcoholism, continued drinking, risk of withdrawal & DTs;  From the DVT prevention standpoint he should be managed w/ PAS hose, early ambulation & perioperative Lovenox if endorsed by  Neurosurgery...  ~  August 26, 2011:  45mo ROV & here for pre-op clearance for back surg- apparently DrMCohen plans back surg, we don't have Ortho notes but pt states that "he guaranteed 10-12 yrs of being pain free after the operation"... His only other complaints are LTOS including difficulty emptying bladder, urgency, decr force of stream, etc> and we discussed starting FLOMAX 0.4mg  Qd...    COPD> still smoking 1/2ppd & not interested in smoking cessation help; denies cough, sputum, hemoptysis, SOB, etc...    Hx PAF> occurred 5/11 in hosp for pneumonia & etoh withdrawal; converted to NSR on CardizemCD 180mg /d & ASA 81mg /d; remains in NSR w/o CP, palpit, edema...    Hx DVT> occurred 5/11 in hosp for pneumonia & etoh withdrawal> treated w/ Coumadin x 66yr & stopped 5/12 w/ neg f/u ven dopplers...    Chol> on diet alone, he has refused meds in the past; last FLP on diet was improved w/ LDL=112; needs to ret fasting for f/u FLP...    GI> HYx GERD on OTC Pepcid prn; Hx divertics/ polyps & overdue for f/u colonoscopy; he denies abd pain, n/v, c/d, blood, etc...    Alcoholism> he still drinks daily- admits to 1 can of beer & 1pt of wine ([employer] or [employer]) daily; hx of DT's during hosp 5/11 for pneumonia...    GU> LTOS persist despite his  saw palmetto rx; tried Enablex 2012 w/o benefit & he stopped; rec FLOMAX 0.4mg  daily...    Neck & Back pain> he has CSpine spondylosis w/ evals from DrStern; and 3 separate lumbar surgeries over the yrs- known multilevel DDD w/ foraminal stenosis & spinal stenosis at each level (severe at L3-4); he states leg pain L>R; he had planned surg 2012 from    DrStern but he decided to pursue Chiropractic care which he said helped for awhile, then worse, & now seeing DrCohen who plans surg soon (we don't have notes from him)...    Anxiety/ Insomnia> not currently on meds due to his drinking... We reviewed prob list, meds, xrays and labs> see below for updates >> EKG 5/12  showed NSR, rate80, WNL, NAD... CXR 7/13 showed COPD w/ scarring at the bases, NAD, no CHF or effusion.Marland Kitchen LABS 7/13:  Chems- wnl;  CBC- wnl;  TSH=1.16;  PSA=0.41   ~  January 18, 2013:  17 month ROV & Kurt [name] has several somatic complaints, not here for a CPX, head stopped up/ sinus issues, feels weak/ not resting well (wood stove & freq nocturnal urination), urinary urgency,etc... We reviewed the following medical problems during today's office visit >>     COPD> still smoking 1/2ppd & not interested in smoking cessation help; denies cough, sputum, hemoptysis, SOB, etc; rec nasal saline, Mucinex, fluids for the sinus symptoms...    Hx PAF> occurred 5/11 in hosp for pneumonia & etoh withdrawal; converted to NSR on CardizemCD 180mg /d & ASA 81mg /d; remains in NSR, BP= 104.62 & he CP, palpit, edema...    Hx DVT> occurred 5/11 in hosp for pneumonia & etoh withdrawal> treated w/ Coumadin x 56yr & stopped 5/12 w/ neg f/u ven dopplers...    Chol> on diet alone, he has refused meds in the past; last FLP on diet was improved w/ LDL=112; needs to ret fasting for f/u FLP...    GI> Hx GERD on OTC Pepcid prn; Hx divertics/ polyps & overdue for f/u colonoscopy; he denies abd pain, n/v, c/d, blood, etc...    Alcoholism> he still drinks daily- admits to 1 can of beer & 1pt of wine ([employer] or [employer]) daily; hx of DT's during hosp 5/11 for pneumonia...    GU> LTOS persist despite his saw palmetto rx; tried Enablex 2012 w/o benefit & he stopped; rec FLOMAX 0.4mg  daily, if symptoms persits- refer to Urology...    Neck & Back pain> he has CSpine spondylosis w/ evals from DrStern; and 3 separate lumbar surgeries over the yrs- known multilevel DDD w/ foraminal stenosis & spinal stenosis at each level (severe at L3-4); he states leg pain L>R; he had planned surg 2012 from    DrStern but he decided to pursue Chiropractic care which he said helped for awhile, then worse, & now seeing DrCohen ?who plans surg per pt, now  getting PT...    Anxiety/ Insomnia> not currently on meds due to his drinking, we will try Lorazepam 1mg - 1/2 to 1 tab Tid prn... We reviewed prob list, meds, xrays and labs> see below for updates >> he had the 2014 Flu vaccine in Sept... LABS 10/14:  Chems- wnl & BS=104;  CBC- wnl;  TSH=1.63          Problem List:  COPD (ICD-496),  CIGARETTE SMOKER (ICD-305.1),  Hx of ABNORMAL CHEST XRAY (ICD-793.1) - +cig smoker & still smoking 1/2ppd (quit transiently around the time of his hosp 5/11)... hx + for cough & whitish sputum,  w/ congestion and chr stable DOE...  denies hemoptysis, worsening dyspnea, wheezing, chest pains, snoring, daytime hypersomnolence, etc... not currently on any resp meds> ~  Baseline CXR shows COPD, chr interstitial markings, NAD.Marland Kitchen. (no prev CT Chest). ~  Prev PFT's 3/05 showed FVC= 3.58 (79%), FEV1= 3.02 (96%), %1sec=84, mid-flows= 139%pred... lung volumes were normal and DLCO was WNL as well... ~  [facility] 5/11 w/ Etoh & bilat pneumonia w/ resp insuffic >> Disch summary reviewed. ~  CXR 2/12 showed COPD, DJD in spine, NAD.Marland Kitchen. ~  CXR 7/13 showed COPD w/ scarring at the bases, NAD, no CHF or effusion.. ~  12/14:  He continues to smoke regularly ~1/2ppd; we reviewed smoking cessation strategies...  PAROXYSMAL ATRIAL FIBRILLATION (ICD-427.31) - he had AFib in hosp 5/11 due to DTs & bilat pneumonia- he converted to NSR on Cardizem, no prob since then... currently stable on DILTIAZEM CD 180mg /d & holding NSR...  PERIPHERAL VASCULAR DISEASE (ICD-443.9) - on ASA 81mg /d...  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  DVT (ICD-453.40) - venous dopplers pos for bilat DVT in hosp 5/11> not felt to be a good Coumadin candidate at the time & started on Lovenox, disch to NHP & eventually converted to COUMADIN- followed in the Coumadin Clinic for 61yr w/o problems... ~  5/12:  Repeat VenDiopplers ==> neg for DVT & Coumadin was discontinued... ~  7/13:  He  reports no prob w/ recurrent DVT, leg swelling, etc... Exercise is lim by his LBP...  HYPERCHOLESTEROLEMIA (ICD-272.0) - on diet alone... ~  FLP 6/07 showed TChol 219, TG 56, HDL 80, LDL 135 ~  FLP 10/10 showed TChol 197, TG 58, HDL 73, LDL 112  GERD (ICD-530.81) - on OTC Pepcid/ Zantac Prn... EGD in 1998 showed mild esophagitis... ~  5/12:  In light of his continued etoh- he is advised to take Prilosec vs Pepcid daily for mucosal protection...  DIVERTICULOSIS OF COLON (ICD-562.10),  COLONIC POLYPS (ICD-211.3) - last colonoscopy 7/05 by DrStark showed divertics & 5mm polyp= tubular adenoma... f/u planned 5 yrs & he is now overdue...  ALCOHOL ABUSE (ICD-305.00) - admits to daily beer consumption... intermittent mild incr SGOT in past... adm to hosp 5/11 e/ Etoh withdrawal & DTs> he stopped transiently after that... he does not voice understanding of alcoholism, & hasn't joined AA etc, & restarted "beer" stating that the Coumadin Clinic told him it was OK. ~  7/13:  Now states he is drinking 1 can beer/d and 1pt wine daily... Advised to quit completely & join AA. ~  12/14:  He continues to drink daily by his own hx despite all efforts to get him to quit...  URINARY FREQUENCY (ICD-788.41) - on Saw Palmetto supplement... he c/o freq and nocturia- no burning, no blood, mild LTOS... ~  labs 5/08 showed PSA= 0.70 ~  labs 10/10 showed PSA= 0.51 ~  5/12:  He requests med trial for the frequency> try ENABLEX 7.5mg  daily (f/u- he reports this wasn't helpful). ~  7/13:  He describes LTOS & we have rec trial FLOMAX 0.4mg  /d... ~  We discussed referral to Urology when he is ready; asked to quit Etoh first to see if it decreases his symptoms...  FATIGUE (ICD-780.79) - on MVI + Vit D 1000/d per daughter... this has been a chronic issue over the yrs...  ORTHO >> shoulder pain, neck pain, back pain...  NECK PAIN (ICD-723.1) - known cervical spondylosis w/ eval from DrRobinson in 90's and  DrStern over the last  10 yrs... 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 last seen by DrStern 7/07 w/ multilevel lumbar DDD w/ foraminal stenosis and spinal stenosis at each level... currently c/o mod back pain, uses Tylenol Prn, mild assoc leg symptoms... ~  4/12:  Repeat eval by DrStern w/ MRI showing multilevel degenerative changes severe spinal stenosis L3-4 due to central disc protrusion (prior decompression on right at L4-5 & left at L5-S1 are unchanged); they planned surg but pt chose to see Chiropractor & states the adjustments are helping... ~  7/13:  Pt reports the Chiro rx stopped helping & he sought second opinion from DrCohen; we don't have notes from him, but pt states that "he guaranteed 10-12 yrs of being pain free after the operation" ~  10/14:  He was evaluated by Dr. Sharolyn Douglas fopr Ortho> diffuse multilevel DDD & spondylosis, they set him up for physical therapy ~  12/14: he has tried Meloxicam15, Tramadol50, and asked to stop smoking 7 drinking...  ANXIETY (ICD-300.00) - not currently on meds...  INSOMNIA, CHRONIC (ICD-307.42) - not currently on meds...   Past Surgical History  Procedure Laterality Date  . 3 seperate lumbar laminectomies    . Right cts release      Dr. Merlyn Lot 12/00    Outpatient Encounter Prescriptions as of 01/18/2013  Medication Sig  . aspirin 81 MG tablet Take 81 mg by mouth daily.  Marland Kitchen diltiazem (CARDIZEM CD) 180 MG 24 hr capsule TAKE 1 CAPSULE BY MOUTH ONCE A DAY  . gabapentin (NEURONTIN) 300 MG capsule Take 1 capsule by mouth Three times a day.  . ibuprofen (ADVIL,MOTRIN) 200 MG tablet Per bottle as needed  . Multiple Vitamins-Minerals (MENS MULTIVITAMIN PLUS PO) Take by mouth. Once a day   . tamsulosin (FLOMAX) 0.4 MG CAPS capsule TAKE 1 CAPSULE BY MOUTH DAILY.  . traMADol (ULTRAM) 50 MG tablet TAKE 1 TABLET BY MOUTH EVERY 8 HOURS AS NEEDED FOR PAIN  . [DISCONTINUED] DULoxetine (CYMBALTA) 30 MG  capsule Take 1 capsule (30 mg total) by mouth daily.  . [DISCONTINUED] fexofenadine (ALLEGRA) 180 MG tablet Take 180 mg by mouth daily.  . [DISCONTINUED] meloxicam (MOBIC) 15 MG tablet Take 1 tablet by mouth daily.    No Known Allergies   Current Medications, Allergies, Past Medical History, Past Surgical History, Family History, and Social History were reviewed in [employer] record.   Review of Systems        See HPI - all other systems neg except as noted... The patient complains of mild dyspnea on exertion, & back pain  The patient denies anorexia, fever, weight loss, weight gain, vision loss, decreased hearing, hoarseness, chest pain, syncope, peripheral edema, prolonged cough, headaches, hemoptysis, abdominal pain, melena, hematochezia, severe indigestion/heartburn, hematuria, incontinence, muscle weakness, suspicious skin lesions, transient blindness, difficulty walking, depression, unusual weight change, abnormal bleeding, enlarged lymph nodes, and angioedema.   Objective:   Physical Exam    WD, WN, 72 y/o BM in NAD... Vital Signs:  Reviewed... GENERAL:  Alert & oriented; pleasant & cooperative... HEENT:  [place]/AT, EOM-wnl, PERRLA, EACs-clear, TMs-wnl, NOSE-clear, THROAT-clear & wnl. NECK:  Supple w/ fairROM; no JVD; normal carotid impulses w/o bruits; no thyromegaly or nodules palpated; no lymphadenopathy. CHEST:  Clear to P & A; without wheezes/ rales/ or rhonchi heard... HEART:  Regular Rhythm; without murmurs/ rubs/ or gallops detected... ABDOMEN:  Soft & nontender; normal bowel sounds; no organomegaly or  masses palpated... EXT: without deformities, mild arthritic changes; no varicose veins/ +venous insuffic/ no edema. BACK:  scar of prev lumbar laminectomies... SLR is neg... NEURO:  CN's intact; motor testing normal; sensory testing normal; gait normal & balance OK. DERM:  No lesions noted; no rash etc...  RADIOLOGY DATA:  Reviewed in the EPIC EMR &  discussed w/ the patient...  LABORATORY DATA:  Reviewed in the EPIC EMR & discussed w/ the patient...   Assessment & Plan:    COPD>  He continues to smoke 1/2ppd & not interested in smoking cessation help etc;  Neither is he interested in any breathing medication "my breathing is fine, no problem"...  Hx PAF>  This was transient & related to his DTs & bilat pneumonia during the 5/11 hosp;  He remains on Diltiazem & holding NSR;  DrBrackbill's consult note is reviewed...  Hx DVT>  He's was on Coumadin x1y & f/u ven dopplers 5/12 was neg= therefore Coumadin stopped; he denies any prob w/ DVT, leg swelling, etc; he knows to avoid Etoh, avoid leg trauma, stay active, etc...  CHOL>  He needs to ret for fasting lipid profile follow up...  GERD>  He is rec to take either PRILOSEC 20mg  OTC daily or PEPCID 20mg  daily for mucosal protection in light of his chr alcoholism...  Etoh>  He is advised that it is NOT ok to drink beer or wine & that he is a chronic alcoholic so it is not acceptable to consume any alcohol & that he desperately needs to join AA etc...  Urinary Freq>  Given trial Enablex w/o benefit; try FLOMAX 0.4mg /d at this point & next option is Urology referral...  BACK PAIN>  S/p mult surg & DrCohen plans more surg for HNP w/ spinal stenosis...  Anxiety/ Insomnia>  As noted...   Patient's Medications  New Prescriptions   LORAZEPAM (ATIVAN) 1 MG TABLET    Take 1/2 to 1 tablet by mouth three times daily as needed  Previous Medications   ASPIRIN 81 MG TABLET    Take 81 mg by mouth daily.   DILTIAZEM (CARDIZEM CD) 180 MG 24 HR CAPSULE    TAKE 1 CAPSULE BY MOUTH ONCE A DAY   GABAPENTIN (NEURONTIN) 300 MG CAPSULE    Take 1 capsule by mouth Three times a day.   IBUPROFEN (ADVIL,MOTRIN) 200 MG TABLET    Per bottle as needed   MULTIPLE VITAMINS-MINERALS (MENS MULTIVITAMIN PLUS PO)    Take by mouth. Once a day    TAMSULOSIN (FLOMAX) 0.4 MG CAPS CAPSULE    TAKE 1 CAPSULE BY MOUTH DAILY.    TRAMADOL (ULTRAM) 50 MG TABLET    TAKE 1 TABLET BY MOUTH EVERY 8 HOURS AS NEEDED FOR PAIN  Modified Medications   No medications on file  Discontinued Medications   DULOXETINE (CYMBALTA) 30 MG CAPSULE    Take 1 capsule (30 mg total) by mouth daily.   FEXOFENADINE (ALLEGRA) 180 MG TABLET    Take 180 mg by mouth daily.   MELOXICAM (MOBIC) 15 MG TABLET    Take 1 tablet by mouth daily.

## 2013-01-18 NOTE — Patient Instructions (Signed)
Today we updated your med list in our EPIC system...    Continue your current medications the same...  We decided to try LORAZEPAM 1mg  tabd- take 1/2 to 1 tab as needed for nerves and sleep...    Try 1/2 tab during the day for anxiety, and 1 tab at bedtime for sleep...  For your sinuses>>    QUIT SMOKING!!!    Use the OTC MUCINEX 600mg - 1 to 2 tabs twice daily w/ lots of fluids...    Use the OTC SALINE nasal mist every 1-2h to keep the passages clear...  Call for any questions...  Let's plan a follow up visit in 30mo, sooner if needed for problems.Marland KitchenMarland Kitchen

## 2013-01-22 ENCOUNTER — Telehealth: Payer: Self-pay | Admitting: Pulmonary Disease

## 2013-01-26 ENCOUNTER — Encounter: Payer: Self-pay | Admitting: *Deleted

## 2013-01-26 NOTE — Telephone Encounter (Signed)
Jury letter has been completed and will mail to the jury excusal.   Jury letter has been signed by SN and mailed to the court.  Nothing further is needed.

## 2013-01-26 NOTE — Telephone Encounter (Signed)
Please advise thanks.

## 2013-02-28 ENCOUNTER — Other Ambulatory Visit: Payer: Self-pay | Admitting: Pulmonary Disease

## 2013-03-16 ENCOUNTER — Other Ambulatory Visit: Payer: Self-pay | Admitting: Pulmonary Disease

## 2013-03-18 ENCOUNTER — Telehealth: Payer: Self-pay | Admitting: Pulmonary Disease

## 2013-03-18 NOTE — Telephone Encounter (Signed)
Kurt [name] is aware and will forward to SN as an [place]

## 2013-05-22 ENCOUNTER — Emergency Department (INDEPENDENT_AMBULATORY_CARE_PROVIDER_SITE_OTHER): Payer: Medicare Other

## 2013-05-22 ENCOUNTER — Encounter (HOSPITAL_COMMUNITY): Payer: Self-pay | Admitting: Emergency Medicine

## 2013-05-22 ENCOUNTER — Emergency Department (HOSPITAL_COMMUNITY)
Admission: EM | Admit: 2013-05-22 | Discharge: 2013-05-22 | Disposition: A | Discharge status: Home / Self Care | Payer: Medicare Other | Source: Home / Self Care | Attending: Family Medicine | Admitting: Family Medicine

## 2013-05-22 DIAGNOSIS — J441 Chronic obstructive pulmonary disease with (acute) exacerbation: Secondary | ICD-10-CM

## 2013-05-22 DIAGNOSIS — J849 Interstitial pulmonary disease, unspecified: Secondary | ICD-10-CM

## 2013-05-22 DIAGNOSIS — J8410 Pulmonary fibrosis, unspecified: Secondary | ICD-10-CM

## 2013-05-22 MED ORDER — ALBUTEROL SULFATE HFA 108 (90 BASE) MCG/ACT IN AERS: 2.0000 | INHALATION_SPRAY | Freq: Four times a day (QID) | RESPIRATORY_TRACT | Status: DC | PRN

## 2013-05-22 MED ORDER — IPRATROPIUM-ALBUTEROL 0.5-2.5 (3) MG/3ML IN SOLN
3.0000 mL | Freq: Once | RESPIRATORY_TRACT | Status: AC
  Administered 2013-05-22: 3 mL via RESPIRATORY_TRACT

## 2013-05-22 MED ORDER — AZITHROMYCIN 250 MG PO TABS: 250.0000 mg | ORAL_TABLET | Freq: Every day | ORAL | Status: DC

## 2013-05-22 MED ORDER — IPRATROPIUM-ALBUTEROL 0.5-2.5 (3) MG/3ML IN SOLN
RESPIRATORY_TRACT | Status: AC
  Filled 2013-05-22: qty 3

## 2013-05-22 MED ORDER — PREDNISONE 50 MG PO TABS: 50.0000 mg | ORAL_TABLET | Freq: Every day | ORAL | Status: DC

## 2013-05-22 NOTE — Discharge Instructions (Signed)
Thank you for coming in today. Take prednisone and azithromycin daily for 5 days.  Use albuterol as needed. Followup with your primary care Dr. Soon.  The radiologist recommends a CT scan of your chest.  Please quit smoking.  Call or go to the emergency room if you get worse, have trouble breathing, have chest pains, or palpitations.   Chronic Obstructive Pulmonary Disease Exacerbation Chronic obstructive pulmonary disease (COPD) is a common lung condition in which airflow from the lungs is limited. COPD is a general term that can be used to describe many different lung problems that limit airflow, including chronic bronchitis and emphysema. COPD exacerbations are episodes when breathing symptoms become much worse and require extra treatment. Without treatment, COPD exacerbations can be life threatening, and frequent COPD exacerbations can cause further damage to your lungs. CAUSES   Respiratory infections.   Exposure to smoke.   Exposure to air pollution, chemical fumes, or dust. Sometimes there is no apparent cause or trigger. RISK FACTORS  Smoking cigarettes.  Older age.  Frequent prior COPD exacerbations. SIGNS AND SYMPTOMS   Increased coughing.   Increased thick spit (sputum) production.   Increased wheezing.   Increased shortness of breath.   Rapid breathing.   Chest tightness. DIAGNOSIS  Your medical history, a physical exam, and tests will help your health care provider make a diagnosis. Tests may include:  A chest X-ray.  Basic lab tests.  Sputum testing.  An arterial blood gas test. TREATMENT  Depending on the severity of your COPD exacerbation, you may need to be admitted to a hospital for treatment. Some of the treatments commonly used to treat COPD exacerbations are:   Antibiotic medicines.   Bronchodilators. These are drugs that expand the air passages. They may be given with an inhaler or nebulizer. Spacer devices may be needed to help  improve drug delivery.  Corticosteroid medicines.  Supplemental oxygen therapy.  HOME CARE INSTRUCTIONS   Do not smoke. Quitting smoking is very important to prevent COPD from getting worse and exacerbations from happening as often.  Avoid exposure to all substances that irritate the airway, especially to tobacco smoke.   If prescribed, take your antibiotics as directed. Finish them even if you start to feel better.  Only take over-the-counter or prescription medicines as directed by your health care provider.It is important to use correct technique with inhaled medicines.  Drink enough fluids to keep your urine clear or pale yellow (unless you have a medical condition that requires fluid restriction).  Use a cool mist vaporizer. This makes it easier to clear your chest when you cough.   If you have a home nebulizer and oxygen, continue to use them as directed.   Maintain all necessary vaccinations to prevent infections.   Exercise regularly.   Eat a healthy diet.   Keep all follow-up appointments as directed by your health care provider. SEEK IMMEDIATE MEDICAL CARE IF:  You have worsening shortness of breath.   You have trouble talking.   You have severe chest pain.  You have blood in your sputum.  You have a fever.  You have weakness, vomit repeatedly, or faint.   You feel confused.   You continue to get worse. MAKE SURE YOU:   Understand these instructions.  Will watch your condition.  Will get help right away if you are not doing well or get worse. Document Released: 11/18/2006 Document Revised: 11/11/2012 Document Reviewed: 09/25/2012 [facility] Patient Information 2014 [place].

## 2013-05-22 NOTE — ED Notes (Signed)
C/o  Fever,  Fatigue,  Decrease in appetite.  N/v.  Denies diarrhea.   Symptoms present x month.  No relief with otc meds.

## 2013-05-22 NOTE — ED Provider Notes (Signed)
Kurt [name] is a 73 y.o. male who presents to Urgent Care today for shortness of breath and cough. Patient has had worsening shortness of breath and productive cough for the past one month. This is associated with intermittent fevers and fatigue. He denies any chest pains or palpitations nausea vomiting or diarrhea. He is reluctant to go to the doctor's office. He is a heavy smoker with a history of COPD.   Past Medical History  Diagnosis Date  . COPD (chronic obstructive pulmonary disease)   . Cigarette smoker   . Abnormal chest x-ray   . History of pneumonia   . Paroxysmal atrial fibrillation   . DVT (deep venous thrombosis)   . Hypercholesterolemia   . GERD (gastroesophageal reflux disease)   . Diverticulosis of colon   . Colonic polyp   . Alcohol abuse   . Urinary frequency   . Neck pain   . Lumbosacral spondylosis without myelopathy   . Fatigue   . Anxiety   . Chronic insomnia    History  Substance Use Topics  . Smoking status: Current Every Day Smoker -- 1.50 packs/day for 54 years    Types: Cigarettes    Last Attempt to Quit: 08/04/2009  . Smokeless tobacco: Former Systems developer    Types: Chew     Comment: still chews at times and still smokes sometimes but not everyday  . Alcohol Use: No   ROS as above Medications: No current facility-administered medications for this encounter.   Current Outpatient Prescriptions  Medication Sig Dispense Refill  . aspirin 81 MG tablet Take 81 mg by mouth daily.      Marland Kitchen diltiazem (CARDIZEM CD) 180 MG 24 hr capsule TAKE 1 CAPSULE BY MOUTH ONCE A DAY  30 capsule  2  . gabapentin (NEURONTIN) 300 MG capsule Take 1 capsule by mouth Three times a day.      . ibuprofen (ADVIL,MOTRIN) 200 MG tablet Per bottle as needed      . LORazepam (ATIVAN) 1 MG tablet Take 1/2 to 1 tablet by mouth three times daily as needed  90 tablet  5  . Multiple Vitamins-Minerals (MENS MULTIVITAMIN PLUS PO) Take by mouth. Once a day       . tamsulosin (FLOMAX) 0.4 MG  CAPS capsule TAKE 1 CAPSULE BY MOUTH DAILY.  30 capsule  6  . traMADol (ULTRAM) 50 MG tablet TAKE 1 TABLET BY MOUTH EVERY 8 HOURS AS NEEDED FOR PAIN  60 tablet  1  . albuterol (PROVENTIL HFA;VENTOLIN HFA) 108 (90 BASE) MCG/ACT inhaler Inhale 2 puffs into the lungs every 6 (six) hours as needed for wheezing or shortness of breath.  1 Inhaler  2  . azithromycin (ZITHROMAX) 250 MG tablet Take 1 tablet (250 mg total) by mouth daily. Take first 2 tablets together, then 1 every day until finished.  6 tablet  0  . predniSONE (DELTASONE) 50 MG tablet Take 1 tablet (50 mg total) by mouth daily.  5 tablet  0    Exam:  BP 127/73  Pulse 110  Temp(Src) 98.9 F (37.2 C) (Oral)  Resp 23  SpO2 91% Gen: Well NAD HEENT: EOMI,  MMM Lungs: Normal work of breathing. Coarse breath sounds with wheezing present bilaterally Heart: Tachycardia but regular no MRG Abd: NABS, Soft. NT, ND Exts: Brisk capillary refill, warm and well perfused.   Patient was given a DuoNeb nebulizer treatment and felt better. His lung exam improved some.  No results found for this or any previous  visit (from the past 24 hour(s)). Dg Chest 2 View  05/22/2013   CLINICAL DATA:  Worsening cough and shortness of breath for the past month. Smoker.  EXAM: CHEST  2 VIEW  COMPARISON:  Chest x-ray 08/26/2011.  FINDINGS: Lung volumes are normal to slightly low. There is diffuse interstitial prominence throughout the lungs bilaterally, particularly in the lung bases, increased compared to prior studies. In the upper lungs, there are some emphysematous changes noted. No acute consolidative airspace disease. Diffuse peribronchial cuffing (similar prior studies, likely chronic). No pleural effusions. No evidence of pulmonary edema. Heart size is normal. Mediastinal contours are unremarkable. Atherosclerotic calcifications in the arch of the aorta.  IMPRESSION: 1. The appearance of the lungs suggests a combination of emphysematous changes and potential  developing interstitial lung disease (most pronounced throughout the lung bases bilaterally). Followup nonemergent evaluation with high-resolution chest CT in the near future is suggested to better characterize these findings. 2. Atherosclerosis.   Electronically Signed   By: Vinnie Langton M.D.   On: 05/22/2013 11:00    Assessment and Plan: 73 y.o. male with COPD exacerbation in the setting of underlying emphysema and probable interstitial lung disease. No current pneumonia. Plan to treat with prednisone and albuterol. Additionally his antibiotics to reduced rate of admission. Encourage patient to cease smoking. Followup with primary care provider. Patient will likely require CT scan of chest to further characterize probable interstitial lung disease. Discussed this with the patient.  Discussed warning signs or symptoms. Please see discharge instructions. Patient expresses understanding.    Gregor Hams, MD 05/22/13 1130

## 2013-06-14 ENCOUNTER — Other Ambulatory Visit: Payer: Self-pay | Admitting: Pulmonary Disease

## 2013-06-19 ENCOUNTER — Other Ambulatory Visit (HOSPITAL_COMMUNITY): Payer: Self-pay | Admitting: Pulmonary Disease

## 2013-07-12 ENCOUNTER — Other Ambulatory Visit: Payer: Self-pay | Admitting: Pulmonary Disease

## 2013-07-15 ENCOUNTER — Telehealth: Payer: Self-pay | Admitting: Pulmonary Disease

## 2013-07-15 MED ORDER — DILTIAZEM HCL ER COATED BEADS 180 MG PO CP24: ORAL_CAPSULE | ORAL | Status: DC

## 2013-07-15 NOTE — Telephone Encounter (Signed)
Called and spoke with pt and he is aware that the rx has been sent to the pharmacy per his request. Nothing further is needed.

## 2013-07-19 ENCOUNTER — Encounter: Payer: Self-pay | Admitting: Pulmonary Disease

## 2013-07-19 ENCOUNTER — Ambulatory Visit (INDEPENDENT_AMBULATORY_CARE_PROVIDER_SITE_OTHER): Payer: Medicare Other | Admitting: Pulmonary Disease

## 2013-07-19 ENCOUNTER — Other Ambulatory Visit (INDEPENDENT_AMBULATORY_CARE_PROVIDER_SITE_OTHER): Payer: Medicare Other

## 2013-07-19 VITALS — BP 120/64 | HR 89 | Temp 97.3°F | Ht 71.0 in | Wt 159.2 lb

## 2013-07-19 DIAGNOSIS — F17200 Nicotine dependence, unspecified, uncomplicated: Secondary | ICD-10-CM

## 2013-07-19 DIAGNOSIS — M47817 Spondylosis without myelopathy or radiculopathy, lumbosacral region: Secondary | ICD-10-CM

## 2013-07-19 DIAGNOSIS — F411 Generalized anxiety disorder: Secondary | ICD-10-CM

## 2013-07-19 DIAGNOSIS — K5730 Diverticulosis of large intestine without perforation or abscess without bleeding: Secondary | ICD-10-CM

## 2013-07-19 DIAGNOSIS — I4891 Unspecified atrial fibrillation: Secondary | ICD-10-CM

## 2013-07-19 DIAGNOSIS — K219 Gastro-esophageal reflux disease without esophagitis: Secondary | ICD-10-CM

## 2013-07-19 DIAGNOSIS — D126 Benign neoplasm of colon, unspecified: Secondary | ICD-10-CM

## 2013-07-19 DIAGNOSIS — F1010 Alcohol abuse, uncomplicated: Secondary | ICD-10-CM

## 2013-07-19 DIAGNOSIS — G4700 Insomnia, unspecified: Secondary | ICD-10-CM

## 2013-07-19 DIAGNOSIS — J449 Chronic obstructive pulmonary disease, unspecified: Secondary | ICD-10-CM

## 2013-07-19 DIAGNOSIS — E7800 Pure hypercholesterolemia, unspecified: Secondary | ICD-10-CM

## 2013-07-19 DIAGNOSIS — I739 Peripheral vascular disease, unspecified: Secondary | ICD-10-CM

## 2013-07-19 DIAGNOSIS — R350 Frequency of micturition: Secondary | ICD-10-CM

## 2013-07-19 LAB — HEPATIC FUNCTION PANEL
ALT: 23 U/L (ref 0–53)
AST: 36 U/L (ref 0–37)
Albumin: 4.1 g/dL (ref 3.5–5.2)
Alkaline Phosphatase: 88 U/L (ref 39–117)
BILIRUBIN TOTAL: 0.8 mg/dL (ref 0.2–1.2)
Bilirubin, Direct: 0.2 mg/dL (ref 0.0–0.3)
TOTAL PROTEIN: 7.6 g/dL (ref 6.0–8.3)

## 2013-07-19 LAB — CBC WITH DIFFERENTIAL/PLATELET
BASOS PCT: 0.2 % (ref 0.0–3.0)
Basophils Absolute: 0 10*3/uL (ref 0.0–0.1)
EOS PCT: 0.8 % (ref 0.0–5.0)
Eosinophils Absolute: 0 10*3/uL (ref 0.0–0.7)
HCT: 45.1 % (ref 39.0–52.0)
Hemoglobin: 15.3 g/dL (ref 13.0–17.0)
LYMPHS PCT: 27.3 % (ref 12.0–46.0)
Lymphs Abs: 1.6 10*3/uL (ref 0.7–4.0)
MCHC: 33.8 g/dL (ref 30.0–36.0)
MCV: 103.4 fl — AB (ref 78.0–100.0)
Monocytes Absolute: 0.2 10*3/uL (ref 0.1–1.0)
Monocytes Relative: 3.4 % (ref 3.0–12.0)
NEUTROS ABS: 4 10*3/uL (ref 1.4–7.7)
NEUTROS PCT: 68.3 % (ref 43.0–77.0)
Platelets: 300 10*3/uL (ref 150.0–400.0)
RBC: 4.37 Mil/uL (ref 4.22–5.81)
RDW: 15.6 % — ABNORMAL HIGH (ref 11.5–15.5)
WBC: 5.9 10*3/uL (ref 4.0–10.5)

## 2013-07-19 LAB — BASIC METABOLIC PANEL
BUN: 8 mg/dL (ref 6–23)
CALCIUM: 9.5 mg/dL (ref 8.4–10.5)
CO2: 24 mEq/L (ref 19–32)
CREATININE: 0.9 mg/dL (ref 0.4–1.5)
Chloride: 105 mEq/L (ref 96–112)
GFR: 104.6 mL/min (ref >60.00)
Glucose, Bld: 96 mg/dL (ref 70–99)
Potassium: 3.9 mEq/L (ref 3.5–5.1)
Sodium: 139 mEq/L (ref 135–145)

## 2013-07-19 LAB — TSH: TSH: 1.76 u[IU]/mL (ref 0.35–4.50)

## 2013-07-19 NOTE — Progress Notes (Signed)
Subjective:    Patient ID: Kurt [name], male    DOB: May 31, 1940, 73 y.o.   MRN: [id number]  HPI 73 y/o BM here for a follow up visit... he has multiple medical problems as noted below...    ~  May 30,2012:  49mo ROV & needs decision regarding anticoagulation in advance of planned back surg by DrStern> MRI Lumbar Spine 4/12 showed multilevel degenerative changes severe spinal stenosis L3-4 due to central disc protrusion (prior decompression on right at L4-5 & left at L5-S1 are unchanged)...  He has hx chronic alcoholism & Etoh withdrawal w/ DTs 5/11> during that hospitalization he had transient AFib & was seen by DrBrackbill for Cards, started on Diltiazem, & converted to NSR (no prob since then);  He also had bilat LE DVT & was treated w/ Lovenox ==> Coumadin & has been followed in the Coumadin Clinic now for 64yr; he denies leg pain or swelling etc==> we discussed checking LE VenDopplers & if they are OK then we will stop his Coumadin at this point- he understands that his risk for future DVT is elevated by virtue of his hx of prev DVT but he can minimize that risk by staying active, avoiding trauma, & stopping the alcohol consumption... In this regard he informs me that he is once again smoking 1+ppd & that he was told by the CC that it would be OK to drink a few beers daily if he wanted to- I informed him that it certainly was NOT OK to drink the beer or whatever because he is an alcoholic & this isn't acceptable (wife shaking her head the whole time)...  He denies CP, palpit, dizzy, syncope, SOB/DOE, edema, etc> EKG today shows NSR, & minor NSSTTWA.    Plan is to check VenDopplers & if neg then stop Coumadin & OK to proceed w/ back surgery from the medical standpoint> his greatest risk would appear to be from his alcoholism, continued drinking, risk of withdrawal & DTs;  From the DVT prevention standpoint he should be managed w/ PAS hose, early ambulation & perioperative Lovenox if endorsed by  Neurosurgery...  ~  August 26, 2011:  88mo ROV & here for pre-op clearance for back surg- apparently DrMCohen plans back surg, we don't have Ortho notes but pt states that "he guaranteed 10-12 yrs of being pain free after the operation"... His only other complaints are LTOS including difficulty emptying bladder, urgency, decr force of stream, etc> and we discussed starting FLOMAX 0.$RemoveBeforeDEI'4mg'NmFFTXbbWFqaBsQX$  Qd...    COPD> still smoking 1/2ppd & not interested in smoking cessation help; denies cough, sputum, hemoptysis, SOB, etc...    Hx PAF> occurred 5/11 in hosp for pneumonia & etoh withdrawal; converted to NSR on CardizemCD $RemoveBefor'180mg'IqJEOpNRIzeD$ /d & ASA $Rem'81mg'RjJz$ /d; remains in NSR w/o CP, palpit, edema...    Hx DVT> occurred 5/11 in hosp for pneumonia & etoh withdrawal> treated w/ Coumadin x 4yr & stopped 5/12 w/ neg f/u ven dopplers...    Chol> on diet alone, he has refused meds in the past; last FLP on diet was improved w/ LDL=112; needs to ret fasting for f/u FLP...    GI> HYx GERD on OTC Pepcid prn; Hx divertics/ polyps & overdue for f/u colonoscopy; he denies abd pain, n/v, c/d, blood, etc...    Alcoholism> he still drinks daily- admits to 1 can of beer & 1pt of wine ([place] or [employer]) daily; hx of DT's during hosp 5/11 for pneumonia...    GU> LTOS persist despite his  saw palmetto rx; tried Enablex 2012 w/o benefit & he stopped; rec FLOMAX 0.$RemoveBefo'4mg'rEjWUszwXKs$  daily...    Neck & Back pain> he has CSpine spondylosis w/ evals from DrStern; and 3 separate lumbar surgeries over the yrs- known multilevel DDD w/ foraminal stenosis & spinal stenosis at each level (severe at L3-4); he states leg pain L>R; he had planned surg 2012 from    DrStern but he decided to pursue Chiropractic care which he said helped for awhile, then worse, & now seeing DrCohen who plans surg soon (we don't have notes from him)...    Anxiety/ Insomnia> not currently on meds due to his drinking... We reviewed prob list, meds, xrays and labs> see below for updates >>  EKG 5/12  showed NSR, rate80, WNL, NAD...  CXR 7/13 showed COPD w/ scarring at the bases, NAD, no CHF or effusion.Marland Kitchen  LABS 7/13:  Chems- wnl;  CBC- wnl;  TSH=1.16;  PSA=0.41   ~  January 18, 2013:  17 month ROV & Fadel has several somatic complaints, not here for a CPX, head stopped up/ sinus issues, feels weak/ not resting well (wood stove & freq nocturnal urination), urinary urgency,etc... We reviewed the following medical problems during today's office visit >>     COPD> still smoking 1/2ppd & not interested in smoking cessation help; denies cough, sputum, hemoptysis, SOB, etc; rec nasal saline, Mucinex, fluids for the sinus symptoms...    Hx PAF> occurred 5/11 in hosp for pneumonia & etoh withdrawal; converted to NSR on CardizemCD $RemoveBefor'180mg'kzSgIaMphJCv$ /d & ASA $Rem'81mg'Ouju$ /d; remains in NSR, BP= 104.62 & he CP, palpit, edema...    Hx DVT> occurred 5/11 in hosp for pneumonia & etoh withdrawal> treated w/ Coumadin x 70yr & stopped 5/12 w/ neg f/u ven dopplers...    Chol> on diet alone, he has refused meds in the past; last FLP on diet was improved w/ LDL=112; needs to ret fasting for f/u FLP...    GI> Hx GERD on OTC Pepcid prn; Hx divertics/ polyps & overdue for f/u colonoscopy; he denies abd pain, n/v, c/d, blood, etc...    Alcoholism> he still drinks daily- admits to 1 can of beer & 1pt of wine ([place] or [employer]) daily; hx of DT's during hosp 5/11 for pneumonia...    GU> LTOS persist despite his saw palmetto rx; tried Enablex 2012 w/o benefit & he stopped; rec FLOMAX 0.$RemoveBefo'4mg'fPwpjUgpRrT$  daily, if symptoms persits- refer to Urology...    Neck & Back pain> he has CSpine spondylosis w/ evals from DrStern; and 3 separate lumbar surgeries over the yrs- known multilevel DDD w/ foraminal stenosis & spinal stenosis at each level (severe at L3-4); he states leg pain L>R; he had planned surg 2012 from    DrStern but he decided to pursue Chiropractic care which he said helped for awhile, then worse, & now seeing DrCohen ?who plans surg per pt,  now getting PT...    Anxiety/ Insomnia> not currently on meds due to his drinking, we will try Lorazepam $RemoveBefor'1mg'SInBSZQwMfxv$ - 1/2 to 1 tab Tid prn... We reviewed prob list, meds, xrays and labs> see below for updates >> he had the 2014 Flu vaccine in Sept...  LABS 10/14:  Chems- wnl & BS=104;  CBC- wnl;  TSH=1.63  ~  July 19, 2013:  42mo ROV & Clarance is c/o feeling weak & tired but denies CP, palpit, SOB or edema; he notes sl cough, nasal congestion, small amt of yellow sput, no hemoptysis; he notes that his back & legs  are stiff, mult somatic complaints; he continues to smoke & drinks beer & moonshine regularly... We reviewed the following medical problems during today's office visit >>     COPD> still smoking 1/2ppd & not interested in smoking cessation help; denies SOB w/ mild cough/ sputum/ etc; PFT today w/ GOLD 2 COPD & rec to STOP SMOKING & start Dulera100-2spBid...    Hx PAF> occurred 5/11 in hosp for pneumonia & etoh withdrawal; converted to NSR on CardizemCD $RemoveBefor'180mg'uzeeNJIIiyVz$ /d & ASA $Rem'81mg'vkUe$ /d; pulse today is sl irreg & EKG shows recurrent AFib, controlled VR=> refer back to Cards...    Hx DVT> occurred 5/11 in hosp for pneumonia & etoh withdrawal> treated w/ Coumadin x 40yr & stopped 5/12 w/ neg f/u ven dopplers...    Chol> on diet alone, he has refused meds in the past; last FLP on diet was improved w/ LDL=112; needs to ret fasting for f/u FLP...    GI> Hx GERD on OTC Pepcid prn; Hx divertics/ polyps & overdue for f/u colonoscopy; he denies abd pain, n/v, c/d, blood, etc...    Alcoholism> he still drinks daily- admits to beer & moonshine daily; hx of DT's during hosp 5/11 for pneumonia...    GU> LTOS improved on FLOMAX0.4    Neck & Back pain> on Tramadol, off Neurontin; he has CSpine spondylosis w/ evals from DrStern; and 3 separate lumbar surgeries over the yrs- known multilevel DDD w/ foraminal stenosis & spinal stenosis at each level (severe at L3-4); he states leg pain L>R; he had planned surg 2012 from DrStern but he  decided to pursue Chiropractic care which he said helped for awhile, then worse, & now seeing DrCohen ?who plans surg per pt, now getting PT...    Anxiety/ Insomnia> not currently on meds due to his drinking, we will try Lorazepam $RemoveBefor'1mg'tUgnyamGvqmH$ - 1/2 to 1 tab Tid prn... We reviewed prob list, meds, xrays and labs> see below for updates >>   CXR 4/15 showed norm heart size, atherosclerotic calcif in Ao, sl incr markings/ COPD, NAD...   PFTs 6/15 showed FVC=3.27 (84%), FEV1= 2.09 (71%), %1sec=64, mid-flows= 44% predicted... This is c/w GOLD stage 2 COPD...  EKG 6/15 showed new onset AFib, rate99, NSSTTWA...  LABS 6/15:  Chems- wnl;  CBC- wnl;  TSH= 1.76... REC> we will start DULERA100-2spBid, encouraged to quit all smoking & drinking; refer to Cards for AFib...          Problem List:  COPD ([id number]),  CIGARETTE SMOKER (ICD-305.1),  Hx of ABNORMAL CHEST XRAY (ICD-793.1) - +cig smoker & still smoking 1/2ppd (quit transiently around the time of his hosp 5/11)... hx + for cough & whitish sputum, w/ congestion and chr stable DOE...  denies hemoptysis, worsening dyspnea, wheezing, chest pains, snoring, daytime hypersomnolence, etc... not currently on any resp meds> ~  Baseline CXR shows COPD, chr interstitial markings, NAD.Marland Kitchen. (no prev CT Chest). ~  Prev PFT's 3/05 showed FVC= 3.58 (79%), FEV1= 3.02 (96%), %1sec=84, mid-flows= 139%pred... lung volumes were normal and DLCO was WNL as well... ~  Hosp 5/11 w/ Etoh & bilat pneumonia w/ resp insuffic >> Disch summary reviewed. ~  CXR 2/12 showed COPD, DJD in spine, NAD.Marland Kitchen. ~  CXR 7/13 showed COPD w/ scarring at the bases, NAD, no CHF or effusion.. ~  12/14:  He continues to smoke regularly ~1/2ppd; we reviewed smoking cessation strategies... ~  CXR 4/15 showed norm heart size, atherosclerotic calcif in Ao, sl incr markings/ COPD, NAD...  ~  PFTs 6/15 showed FVC=3.27 (  84%), FEV1= 2.09 (71%), %1sec=64, mid-flows= 44% predicted... This is c/w GOLD stage 2 COPD... Must  quit smoking & we decided to start Dulera100-2spBid...  PAROXYSMAL ATRIAL FIBRILLATION (ICD-427.31) - he had AFib in hosp 5/11 due to DTs & bilat pneumonia- he converted to NSR on Cardizem, no prob since then... currently stable on DILTIAZEM CD $RemoveBefo'180mg'rslODFFKdMF$ /d & holding NSR... ~  6/15: on [place];  EKG showed new onset AFib, rate99, NSSTTWA => referred to Cards...  PERIPHERAL VASCULAR DISEASE (ICD-443.9) - on ASA $Remo'81mg'MGBAL$ /d...  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  DVT (ICD-453.40) - venous dopplers pos for bilat DVT in hosp 5/11> not felt to be a good Coumadin candidate at the time & started on Lovenox, disch to NHP & eventually converted to COUMADIN- followed in the Coumadin Clinic for 28yr w/o problems... ~  5/12:  Repeat VenDiopplers ==> neg for DVT & Coumadin was discontinued... ~  7/13:  He reports no prob w/ recurrent DVT, leg swelling, etc... Exercise is lim by his LBP...  HYPERCHOLESTEROLEMIA (ICD-272.0) - on diet alone... ~  [place] 6/07 showed TChol 219, TG 56, HDL 80, LDL 135 ~  FLP 10/10 showed TChol 197, TG 58, HDL 73, LDL 112  GERD (ICD-530.81) - on OTC Pepcid/ Zantac Prn... EGD in 1998 showed mild esophagitis... ~  5/12:  In light of his continued etoh- he is advised to take Prilosec vs Pepcid daily for mucosal protection...  DIVERTICULOSIS OF COLON (ICD-562.10),  COLONIC POLYPS (ICD-211.3) - last colonoscopy 7/05 by DrStark showed divertics & 29mm polyp= tubular adenoma... f/u planned 5 yrs & he is now overdue...  ALCOHOL ABUSE (ICD-305.00) - admits to daily beer consumption... intermittent mild incr SGOT in past... adm to hosp 5/11 e/ Etoh withdrawal & DTs> he stopped transiently after that... he does not voice understanding of alcoholism, & hasn't joined AA etc, & restarted "beer" stating that the Coumadin Clinic told him it was OK. ~  7/13:  Now states he is drinking 1 can beer/d and 1pt wine daily... Advised to quit completely & join  AA. ~  12/14 & 6/15:  He continues to drink daily by his own hx despite all efforts to get him to quit...  URINARY FREQUENCY (ICD-788.41) - on Saw [place]metto supplement... he c/o freq and nocturia- no burning, no blood, mild LTOS... ~  labs 5/08 showed PSA= 0.70 ~  labs 10/10 showed PSA= 0.51 ~  5/12:  He requests med trial for the frequency> try ENABLEX 7.$RemoveBefor'5mg'HZETWNzuddkm$  daily (f/u- he reports this wasn't helpful). ~  7/13:  He describes LTOS & we have rec trial FLOMAX 0.$RemoveBefore'4mg'OjhnYJQCEbdIe$  /d... ~  We discussed referral to Urology when he is ready; asked to quit Etoh first to see if it decreases his symptoms...  FATIGUE (ICD-780.79) - on MVI + Vit D 1000/d per daughter... this has been a chronic issue over the yrs...  ORTHO >> shoulder pain, neck pain, back pain...  NECK PAIN (ICD-723.1) - known cervical spondylosis w/ eval from [facility] in 90's and DrStern over the last 10 yrs... 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 last seen by DrStern 7/07 w/ multilevel lumbar DDD w/ foraminal stenosis and spinal stenosis at each level... currently c/o mod back pain, uses Tylenol Prn, mild assoc leg symptoms... ~  4/12:  Repeat eval by DrStern w/ MRI showing multilevel degenerative changes severe spinal stenosis L3-4 due to central disc  protrusion (prior decompression on right at L4-5 & left at L5-S1 are unchanged); they planned surg but pt chose to see Chiropractor & states the adjustments are helping... ~  7/13:  Pt reports the Chiro rx stopped helping & he sought second opinion from [facility]; we don't have notes from him, but pt states that "he guaranteed 10-12 yrs of being pain free after the operation" ~  10/14:  He was evaluated by Dr. Rennis Harding fopr Ortho> diffuse multilevel DDD & spondylosis, they set him up for physical therapy ~  12/14: he has tried Meloxicam15, Tramadol50, and asked to stop smoking 7 drinking...  ANXIETY (ICD-300.00) - not  currently on meds...  INSOMNIA, CHRONIC (ICD-307.42) - not currently on meds...   Past Surgical History  Procedure Laterality Date  . 3 seperate lumbar laminectomies    . Right cts release      Dr. Fredna Dow 12/00    Outpatient Encounter Prescriptions as of 07/19/2013  Medication Sig  . albuterol (PROVENTIL HFA;VENTOLIN HFA) 108 (90 BASE) MCG/ACT inhaler Inhale 2 puffs into the lungs every 6 (six) hours as needed for wheezing or shortness of breath.  Marland Kitchen aspirin 81 MG tablet Take 81 mg by mouth daily.  Marland Kitchen diltiazem (CARDIZEM CD) 180 MG 24 hr capsule TAKE 1 CAPSULE BY MOUTH ONCE A DAY  . ibuprofen (ADVIL,MOTRIN) 200 MG tablet Per bottle as needed  . LORazepam (ATIVAN) 1 MG tablet Take 1/2 to 1 tablet by mouth three times daily as needed  . tamsulosin (FLOMAX) 0.4 MG CAPS capsule TAKE 1 CAPSULE BY MOUTH DAILY.  . traMADol (ULTRAM) 50 MG tablet TAKE 1 TABLET EVERY 8 HOURS AS NEEDED FOR PAIN  . gabapentin (NEURONTIN) 300 MG capsule Take 1 capsule by mouth Three times a day.  . Multiple Vitamins-Minerals (MENS MULTIVITAMIN PLUS PO) Take by mouth. Once a day   . predniSONE (DELTASONE) 50 MG tablet Take 1 tablet (50 mg total) by mouth daily.  . [DISCONTINUED] azithromycin (ZITHROMAX) 250 MG tablet Take 1 tablet (250 mg total) by mouth daily. Take first 2 tablets together, then 1 every day until finished.    No Known Allergies   Current Medications, Allergies, Past Medical History, Past Surgical History, Family History, and Social History were reviewed in [employer] record.   Review of Systems        See HPI - all other systems neg except as noted... The patient complains of mild dyspnea on exertion, & back pain  The patient denies anorexia, fever, weight loss, weight gain, vision loss, decreased hearing, hoarseness, chest pain, syncope, peripheral edema, prolonged cough, headaches, hemoptysis, abdominal pain, melena, hematochezia, severe indigestion/heartburn, hematuria,  incontinence, muscle weakness, suspicious skin lesions, transient blindness, difficulty walking, depression, unusual weight change, abnormal bleeding, enlarged lymph nodes, and angioedema.   Objective:   Physical Exam    WD, WN, 73 y/o BM in NAD... Vital Signs:  Reviewed... GENERAL:  Alert & oriented; pleasant & cooperative... HEENT:  [place]/AT, EOM-wnl, PERRLA, EACs-clear, TMs-wnl, NOSE-clear, THROAT-clear & wnl. NECK:  Supple w/ fairROM; no JVD; normal carotid impulses w/o bruits; no thyromegaly or nodules palpated; no lymphadenopathy. CHEST:  Clear to P & A; without wheezes/ rales/ or rhonchi heard... HEART:  Regular Rhythm; without murmurs/ rubs/ or gallops detected... ABDOMEN:  Soft & nontender; normal bowel sounds; no organomegaly or masses palpated... EXT: without deformities, mild arthritic changes; no varicose veins/ +venous insuffic/ no edema. BACK:  scar of prev lumbar laminectomies... SLR is neg... NEURO:  CN's intact;  motor testing normal; sensory testing normal; gait normal & balance OK. DERM:  No lesions noted; no rash etc...  RADIOLOGY DATA:  Reviewed in the EPIC EMR & discussed w/ the patient...  LABORATORY DATA:  Reviewed in the EPIC EMR & discussed w/ the patient...   Assessment & Plan:    COPD>  He continues to smoke 1/2ppd & not interested in smoking cessation help etc;  Neither is he interested in any breathing medication "my breathing is fine, no problem"...  Hx PAF>  This was transient & related to his DTs & bilat pneumonia during the 5/11 hosp;  He remains on Diltiazem & holding NSR;  DrBrackbill's consult note is reviewed...  Hx DVT>  He's was on Coumadin x1y & f/u ven dopplers 5/12 was neg= therefore Coumadin stopped; he denies any prob w/ DVT, leg swelling, etc; he knows to avoid Etoh, avoid leg trauma, stay active, etc...  CHOL>  He needs to ret for fasting lipid profile follow up...  GERD>  He is rec to take either PRILOSEC $RemoveBeforeD'20mg'YzYXCKuIkDXxMT$  OTC daily or PEPCID $RemoveB'20mg'ZKlFtOIO$   daily for mucosal protection in light of his chr alcoholism...  Etoh>  He is advised that it is NOT ok to drink beer or wine & that he is a chronic alcoholic so it is not acceptable to consume any alcohol & that he desperately needs to join AA etc...  Urinary Freq>  Given trial Enablex w/o benefit; try FLOMAX 0.$RemoveBefo'4mg'gaXoWdLoMwV$ /d at this point & next option is Urology referral...  BACK PAIN>  S/p mult surg & DrCohen plans more surg for HNP w/ spinal stenosis...  Anxiety/ Insomnia>  As noted.Marland KitchenMarland Kitchen

## 2013-07-19 NOTE — Patient Instructions (Signed)
Today we updated your med list in our EPIC system...    Continue your current medications the same...  Your EKG shows Atrial Fibrillation & we will arrange for a Cardiology evaluation ASAP...  Your breathing test shows just mild airflow obstruction...    We will start you on a DULERA inhaler- 2 sprays twice daily...  It is imperative that you quit smoking & quit drinking now!!!  Today we did your follow up blood work to check for a cause of your fatigue...    We will contact you w/ the results when available...   Call for any questions...  Let's plan a follow up visit in 15mo, sooner if needed for problems.Marland KitchenMarland Kitchen

## 2013-07-23 ENCOUNTER — Encounter: Payer: Self-pay | Admitting: Cardiology

## 2013-07-23 ENCOUNTER — Ambulatory Visit (INDEPENDENT_AMBULATORY_CARE_PROVIDER_SITE_OTHER): Payer: Medicare Other | Admitting: Cardiology

## 2013-07-23 VITALS — BP 120/80 | HR 74 | Ht 71.0 in | Wt 161.0 lb

## 2013-07-23 DIAGNOSIS — I4891 Unspecified atrial fibrillation: Secondary | ICD-10-CM

## 2013-07-23 MED ORDER — APIXABAN 5 MG PO TABS: 5.0000 mg | ORAL_TABLET | Freq: Two times a day (BID) | ORAL | Status: DC

## 2013-07-23 NOTE — Progress Notes (Signed)
HPI The patient presents for evaluation of atrial fibrillation. He has had this diagnosis in the past. However, it apparently had been paroxysmal. He had a DVT at one point in time. He was taking anticoagulation but has been off of this for some time. However, when he saw Dr. Lenna Gilford yesterday he was noted to be in atrial fibrillation. The patient doesn't feel any palpitations, presyncope or syncope. He doesn't have any shortness of breath, PND or orthopnea. He denies any chest pressure, neck or arm discomfort. He has had no weight gain or edema. He does feel fatigued. However, he's up quite frequently during the night and never sleeps through the night in part because he has to urinate and in part because he has these habits and also because he takes care of his wife with Alzheimer's. He has had no history of bleeding. However, he does drink alcohol by his report a couple of drinks per night.  No Known Allergies  Current Outpatient Prescriptions  Medication Sig Dispense Refill  . albuterol (PROVENTIL HFA;VENTOLIN HFA) 108 (90 BASE) MCG/ACT inhaler Inhale 2 puffs into the lungs every 6 (six) hours as needed for wheezing or shortness of breath.  1 Inhaler  2  . aspirin 81 MG tablet Take 81 mg by mouth daily.      Marland Kitchen diltiazem (CARDIZEM CD) 180 MG 24 hr capsule TAKE 1 CAPSULE BY MOUTH ONCE A DAY  30 capsule  5  . gabapentin (NEURONTIN) 300 MG capsule Take 1 capsule by mouth Three times a day.      . ibuprofen (ADVIL,MOTRIN) 200 MG tablet Per bottle as needed      . LORazepam (ATIVAN) 1 MG tablet Take 1/2 to 1 tablet by mouth three times daily as needed  90 tablet  5  . Multiple Vitamins-Minerals (MENS MULTIVITAMIN PLUS PO) Take by mouth. Once a day       . predniSONE (DELTASONE) 50 MG tablet Take 1 tablet (50 mg total) by mouth daily.  5 tablet  0  . tamsulosin (FLOMAX) 0.4 MG CAPS capsule TAKE 1 CAPSULE BY MOUTH DAILY.  30 capsule  6  . traMADol (ULTRAM) 50 MG tablet TAKE 1 TABLET EVERY 8 HOURS AS  NEEDED FOR PAIN  60 tablet  1   No current facility-administered medications for this visit.    Past Medical History  Diagnosis Date  . COPD (chronic obstructive pulmonary disease)   . Cigarette smoker   . Abnormal chest x-ray   . History of pneumonia   . Paroxysmal atrial fibrillation   . DVT (deep venous thrombosis)   . Hypercholesterolemia   . GERD (gastroesophageal reflux disease)   . Diverticulosis of colon   . Colonic polyp   . Alcohol abuse   . Urinary frequency   . Neck pain   . Lumbosacral spondylosis without myelopathy   . Fatigue   . Anxiety   . Chronic insomnia     Past Surgical History  Procedure Laterality Date  . 3 seperate lumbar laminectomies    . Right cts release      Dr. Fredna Dow 12/00    Family History  Problem Relation Age of Onset  . Prostate cancer Father     History   Social History  . Marital Status: Married    Spouse Name: N/A    Number of Children: N/A  . Years of Education: N/A   Occupational History  . reitred from [place] [employer]  .  Smoking status: Current Every Day Smoker -- 1.50 packs/day for 54 years    Types: Cigarettes    Last Attempt to Quit: 08/04/2009  . Smokeless tobacco: Former Systems developer    Types: Chew     Comment: still chews at times and still smokes sometimes but not everyday  . Alcohol Use: No  . Drug Use: No  . Sexual Activity: Not on file   Other Topics Concern  . Not on file   Social History Narrative  . No narrative on file    ROS:  As stated in the HPI and negative for all other systems.  PHYSICAL EXAM BP 120/80  Pulse 74  Ht 5\' 11"  (1.803 m)  Wt 161 lb (73.029 kg)  BMI 22.46 kg/m2 GENERAL:  Well appearing HEENT:  Pupils equal round and reactive, fundi not visualized, oral mucosa unremarkable, poor dentition.  NECK:  No jugular venous distention, waveform within normal limits, carotid upstroke brisk and symmetric, no bruits, no thyromegaly LYMPHATICS:  No cervical, inguinal  adenopathy LUNGS:  Clear to auscultation bilaterally BACK:  No CVA tenderness CHEST:  Unremarkable HEART:  PMI not displaced or sustained,S1 and S2 within normal limits, no S3, no clicks, no rubs, no murmurs, irregular ABD:  Flat, positive bowel sounds normal in frequency in pitch, no bruits, no rebound, no guarding, no midline pulsatile mass, no hepatomegaly, no splenomegaly EXT:  2 plus pulses throughout, no edema, no cyanosis no clubbing SKIN:  No rashes no nodules NEURO:  Cranial nerves II through XII grossly intact, motor grossly intact throughout PSYCH:  Cognitively intact, oriented to person place and time  EKG:  Sinus rhythm, rate 66, axis within normal limits, intervals within normal limits, left atrial enlargement, early transition in lead the tip, no acute ST-T wave changes.  ASSESSMENT AND PLAN  PAF: The patient was in atrial fibrillation yesterday but he did not feel this. He's in sinus rhythm today.  Mr. DUEY LILLER has a CHA2DS2 - VASc score of 2 with a risk of stroke of 2.2%  and a HAS - BLED score of 2 with a moderate risk of bleeding.  He therefore would have an indication for systemic anticoagulation.   However, he is not convinced that he would take this.  He would like to know the cost.  I will give him a prescription for Eliquis I will also check with Dr. Lenna Gilford to see if he thinks that he is a reasonable candidate for anticoagulation.  I discussed the risk benefits with the patient.  No other change in therapy is indicated.

## 2013-07-23 NOTE — Patient Instructions (Signed)
Please start Eliquis 5 mg one tablet twice a day. Continue all other medications as listed.  Follow up with PA/NP in 4 weeks.

## 2013-07-26 ENCOUNTER — Telehealth: Payer: Self-pay | Admitting: *Deleted

## 2013-07-26 NOTE — Telephone Encounter (Signed)
Eliquis approved through [employer] through 07/27/2014 Pharmacy notified

## 2013-07-26 NOTE — Telephone Encounter (Signed)
PA to [employer] for [employer]

## 2013-07-27 ENCOUNTER — Encounter: Payer: Self-pay | Admitting: *Deleted

## 2013-08-23 ENCOUNTER — Ambulatory Visit (INDEPENDENT_AMBULATORY_CARE_PROVIDER_SITE_OTHER): Payer: Medicare Other | Admitting: Physician Assistant

## 2013-08-23 ENCOUNTER — Encounter: Payer: Self-pay | Admitting: Physician Assistant

## 2013-08-23 VITALS — BP 130/80 | HR 67 | Ht 71.0 in | Wt 160.0 lb

## 2013-08-23 DIAGNOSIS — I480 Paroxysmal atrial fibrillation: Secondary | ICD-10-CM

## 2013-08-23 DIAGNOSIS — I4891 Unspecified atrial fibrillation: Secondary | ICD-10-CM

## 2013-08-23 NOTE — Patient Instructions (Signed)
Your physician recommends that you continue on your current medications as directed. Please refer to the Current Medication list given to you today.  Your physician recommends that you return for lab work in: [place] recommends that you schedule a follow-up appointment in: 2 TO [facility]

## 2013-08-23 NOTE — Progress Notes (Signed)
Cardiology Office Note    Date:  08/23/2013   ID:  Kurt [name], DOB April 28, 1940, MRN [id number]  PCP:  [name], MD  Cardiologist:  Dr. [name]      History of Present Illness: Kurt [name] is a 73 y.o. male with a history of COPD, HL, prior DVT, GERD. He established with Dr. Percival Spanish in 07/2013 for AFib.  Atrial fibrillation was noted during a visit with his PCP. When he saw Dr. Percival Spanish, he was back in sinus rhythm. Stroke risk was felt to be significant and Dr. Percival Spanish recommended starting Eliquis. He returns for followup.  He is doing well.  He denies any chest pain, dyspnea, syncope, orthopnea, PND, edema. He denies any bleeding.      Studies:  - Echo (06/28/09):  EF 55%, possible mild posterior wall hypokinesis, grade 1 diastolic dysfunction, MAC, atrial septum lipomatous hypertrophy    Recent Labs: 07/19/2013: ALT 23; Creatinine 0.9; Hemoglobin 15.3; Potassium 3.9; TSH 1.76   Wt Readings from Last 3 Encounters:  07/23/13 161 lb (73.029 kg)  07/19/13 159 lb 3.2 oz (72.213 kg)  01/18/13 157 lb 6.4 oz (71.396 kg)     Past Medical History  Diagnosis Date  . COPD (chronic obstructive pulmonary disease)   . Cigarette smoker   . Abnormal chest x-ray   . History of pneumonia   . Atrial fibrillation   . DVT (deep venous thrombosis)   . Hypercholesterolemia   . GERD (gastroesophageal reflux disease)   . Diverticulosis of colon   . Colonic polyp   . Alcohol abuse   . Urinary frequency   . Neck pain   . Lumbosacral spondylosis without myelopathy   . Fatigue   . Anxiety   . Chronic insomnia     Current Outpatient Prescriptions  Medication Sig Dispense Refill  . albuterol (PROVENTIL HFA;VENTOLIN HFA) 108 (90 BASE) MCG/ACT inhaler Inhale 2 puffs into the lungs every 6 (six) hours as needed for wheezing or shortness of breath.  1 Inhaler  2  . apixaban (ELIQUIS) 5 MG TABS tablet Take 1 tablet (5 mg total) by mouth 2 (two) times daily.  60 tablet  11  .  aspirin 81 MG tablet Take 81 mg by mouth daily.      Marland Kitchen diltiazem (CARDIZEM CD) 180 MG 24 hr capsule TAKE 1 CAPSULE BY MOUTH ONCE A DAY  30 capsule  5  . gabapentin (NEURONTIN) 300 MG capsule Take 1 capsule by mouth Three times a day.      . ibuprofen (ADVIL,MOTRIN) 200 MG tablet Per bottle as needed      . LORazepam (ATIVAN) 1 MG tablet Take 1/2 to 1 tablet by mouth three times daily as needed  90 tablet  5  . Multiple Vitamins-Minerals (MENS MULTIVITAMIN PLUS PO) Take by mouth. Once a day       . predniSONE (DELTASONE) 50 MG tablet Take 1 tablet (50 mg total) by mouth daily.  5 tablet  0  . tamsulosin (FLOMAX) 0.4 MG CAPS capsule TAKE 1 CAPSULE BY MOUTH DAILY.  30 capsule  6  . traMADol (ULTRAM) 50 MG tablet TAKE 1 TABLET EVERY 8 HOURS AS NEEDED FOR PAIN  60 tablet  1   No current facility-administered medications for this visit.    Allergies:   Review of patient's allergies indicates no known allergies.   Social History:  The patient  reports that he has been smoking Cigarettes.  He has a 81 pack-year smoking history.  He has quit using smokeless tobacco. His smokeless tobacco use included Chew. He reports that he does not drink alcohol or use illicit drugs.   Family History:  The patient's family history includes Prostate cancer in his father.   ROS:  Please see the history of present illness.      All other systems reviewed and negative.   PHYSICAL EXAM: VS:  BP 130/80  Pulse 67  Ht 5\' 11"  (1.803 m)  Wt 160 lb (72.576 kg)  BMI 22.33 kg/m2 Well nourished, well developed, in no acute distress HEENT: normal Neck: no JVD Cardiac:  normal S1, S2; RRR; no murmur Lungs:  clear to auscultation bilaterally, no wheezing, rhonchi or rales Abd: soft, nontender, no hepatomegaly Ext: no edema Skin: warm and dry Neuro:  CNs 2-12 intact, no focal abnormalities noted  EKG:  NSR, HR 67     ASSESSMENT AND PLAN:  1. Paroxysmal atrial fibrillation -  CHADS2-VASc=2 (vascular disease, age > 35).   Check Basic Metabolic Panel (BMET), CBC today.  We discussed the importance of using caution with and limiting ETOH while on anticoagulation.  He can stop ASA.  Continue Eliquis.  He appears to be tolerating this.  Continue Diltiazem.   2. Disposition:  F/u with Dr. [name] in 3 mos.    Signed, Versie Starks, MHS 08/23/2013 2:23 PM    [facility] [place], [place], [place]  [address] Phone: [phone]; Fax: [phone]

## 2013-08-24 ENCOUNTER — Telehealth: Payer: Self-pay | Admitting: *Deleted

## 2013-08-24 LAB — BASIC METABOLIC PANEL
BUN: 7 mg/dL (ref 6–23)
CHLORIDE: 106 meq/L (ref 96–112)
CO2: 28 meq/L (ref 19–32)
Calcium: 9.2 mg/dL (ref 8.4–10.5)
Creatinine, Ser: 1 mg/dL (ref 0.4–1.5)
GFR: 99.51 mL/min (ref >60.00)
Glucose, Bld: 83 mg/dL (ref 70–99)
Potassium: 3.8 mEq/L (ref 3.5–5.1)
SODIUM: 139 meq/L (ref 135–145)

## 2013-08-24 LAB — CBC
HEMATOCRIT: 42.2 % (ref 39.0–52.0)
HEMOGLOBIN: 14.1 g/dL (ref 13.0–17.0)
MCHC: 33.3 g/dL (ref 30.0–36.0)
MCV: 106 fl — ABNORMAL HIGH (ref 78.0–100.0)
PLATELETS: 300 10*3/uL (ref 150.0–400.0)
RBC: 3.98 Mil/uL — ABNORMAL LOW (ref 4.22–5.81)
RDW: 14.6 % (ref 11.5–15.5)
WBC: 4.9 10*3/uL (ref 4.0–10.5)

## 2013-08-24 NOTE — Telephone Encounter (Signed)
pt has been notified about lab results with verbla understanding

## 2013-08-24 NOTE — Telephone Encounter (Signed)
no answer, I will try again later

## 2013-09-06 ENCOUNTER — Other Ambulatory Visit: Payer: Self-pay | Admitting: Pulmonary Disease

## 2013-09-27 ENCOUNTER — Other Ambulatory Visit: Payer: Self-pay | Admitting: Pulmonary Disease

## 2013-09-29 ENCOUNTER — Other Ambulatory Visit: Payer: Self-pay | Admitting: Pulmonary Disease

## 2013-10-01 ENCOUNTER — Encounter: Payer: Self-pay | Admitting: Gastroenterology

## 2013-10-04 ENCOUNTER — Other Ambulatory Visit: Payer: Self-pay | Admitting: Pulmonary Disease

## 2013-11-12 ENCOUNTER — Encounter: Payer: Self-pay | Admitting: Cardiology

## 2013-11-25 ENCOUNTER — Ambulatory Visit (INDEPENDENT_AMBULATORY_CARE_PROVIDER_SITE_OTHER): Payer: Medicare Other | Admitting: Cardiology

## 2013-11-25 ENCOUNTER — Encounter: Payer: Self-pay | Admitting: Cardiology

## 2013-11-25 VITALS — BP 104/60 | HR 62 | Ht 71.0 in | Wt 165.5 lb

## 2013-11-25 DIAGNOSIS — I480 Paroxysmal atrial fibrillation: Secondary | ICD-10-CM

## 2013-11-25 NOTE — Patient Instructions (Signed)
Your physician recommends that you schedule a follow-up appointment in:  One year with Dr. Hochrein  

## 2013-11-25 NOTE — Progress Notes (Signed)
HPI The patient presents for evaluation of atrial fibrillation. He had this paroxysmally prior to the first visit. He never felt. He has not felt it since and at the last visit he was in sinus rhythm. He denies any new cardiovascular symptoms. The patient denies any new symptoms such as chest discomfort, neck or arm discomfort. There has been no new shortness of breath, PND or orthopnea. There have been no reported palpitations, presyncope or syncope. He does not report any bleeding issues with his Eliquis  No Known Allergies  Current Outpatient Prescriptions  Medication Sig Dispense Refill  . albuterol (PROVENTIL HFA;VENTOLIN HFA) 108 (90 BASE) MCG/ACT inhaler Inhale 2 puffs into the lungs every 6 (six) hours as needed for wheezing or shortness of breath.  1 Inhaler  2  . apixaban (ELIQUIS) 5 MG TABS tablet Take 1 tablet (5 mg total) by mouth 2 (two) times daily.  60 tablet  11  . diltiazem (CARDIZEM CD) 180 MG 24 hr capsule TAKE 1 CAPSULE BY MOUTH ONCE A DAY  30 capsule  2  . gabapentin (NEURONTIN) 300 MG capsule Take 1 capsule by mouth Three times a day.      . ibuprofen (ADVIL,MOTRIN) 200 MG tablet Per bottle as needed      . LORazepam (ATIVAN) 1 MG tablet TAKE 1/2 TO 1 TABLET BY MOUTH 3 TIMES A DAY AS NEEDED  90 tablet  5  . Multiple Vitamins-Minerals (MENS MULTIVITAMIN PLUS PO) Take by mouth. Once a day       . predniSONE (DELTASONE) 50 MG tablet Take 1 tablet (50 mg total) by mouth daily.  5 tablet  0  . tamsulosin (FLOMAX) 0.4 MG CAPS capsule TAKE 1 CAPSULE BY MOUTH DAILY.  30 capsule  6  . traMADol (ULTRAM) 50 MG tablet TAKE 1 TABLET EVERY 8 HOURS AS NEEDED FOR PAIN  60 tablet  1   No current facility-administered medications for this visit.    Past Medical History  Diagnosis Date  . COPD (chronic obstructive pulmonary disease)   . Cigarette smoker   . Abnormal chest x-ray   . History of pneumonia   . Atrial fibrillation   . DVT (deep venous thrombosis)   .  Hypercholesterolemia   . GERD (gastroesophageal reflux disease)   . Diverticulosis of colon   . Colonic polyp   . Alcohol abuse   . Urinary frequency   . Neck pain   . Lumbosacral spondylosis without myelopathy   . Fatigue   . Anxiety   . Chronic insomnia     Past Surgical History  Procedure Laterality Date  . 3 seperate lumbar laminectomies    . Right cts release      Dr. Fredna Dow 12/00     ROS:  As stated in the HPI and negative for all other systems.  PHYSICAL EXAM BP 104/60  Pulse 62  Ht 5\' 11"  (1.803 m)  Wt 165 lb 8 oz (75.07 kg)  BMI 23.09 kg/m2 GENERAL:  Well appearing NECK:  No jugular venous distention, waveform within normal limits, carotid upstroke brisk and symmetric, no bruits, no thyromegaly LUNGS:  Clear to auscultation bilaterally CHEST:  Unremarkable HEART:  PMI not displaced or sustained,S1 and S2 within normal limits, no S3, no clicks, no rubs, no murmurs, irregular ABD:  Flat, positive bowel sounds normal in frequency in pitch, no bruits, no rebound, no guarding, no midline pulsatile mass, no hepatomegaly, no splenomegaly EXT:  2 plus pulses throughout, no edema, no cyanosis no  clubbing  EKG:  Sinus rhythm, rate 62 , axis within normal limits, intervals within normal limits, left atrial enlargement, early transition in lead the tip, no acute ST-T wave changes.  ASSESSMENT AND PLAN  PAF: The patient was in atrial fibrillation but he did not feel this prior to the last visit.Marland Kitchen He's in sinus rhythm today and was when he saw the PAc recently.  Mr. AYDIN HINK has a CHA2DS2 - VASc score of 2 with a risk of stroke of 2.2%  and a HAS - BLED score of 2 with a moderate risk of bleeding.  The patient  Tolerates anticoagulation. We will continue with the meds as listed.

## 2013-11-26 ENCOUNTER — Ambulatory Visit: Payer: Medicare Other | Admitting: Cardiology

## 2013-12-24 ENCOUNTER — Other Ambulatory Visit: Payer: Self-pay | Admitting: Pulmonary Disease

## 2014-01-07 ENCOUNTER — Ambulatory Visit (INDEPENDENT_AMBULATORY_CARE_PROVIDER_SITE_OTHER): Payer: Medicare Other | Admitting: Podiatrist

## 2014-01-07 ENCOUNTER — Ambulatory Visit (INDEPENDENT_AMBULATORY_CARE_PROVIDER_SITE_OTHER): Payer: Medicare Other

## 2014-01-07 ENCOUNTER — Encounter: Payer: Self-pay | Admitting: Podiatrist

## 2014-01-07 VITALS — BP 126/66 | HR 82 | Resp 17

## 2014-01-07 DIAGNOSIS — M79672 Pain in left foot: Secondary | ICD-10-CM

## 2014-01-07 DIAGNOSIS — Q828 Other specified congenital malformations of skin: Secondary | ICD-10-CM

## 2014-01-07 DIAGNOSIS — M7732 Calcaneal spur, left foot: Secondary | ICD-10-CM

## 2014-01-07 NOTE — Progress Notes (Signed)
   Subjective:    Patient ID: Kurt [name], male    DOB: 31-May-1940, 73 y.o.   MRN: [id number]  HPI Pt states that he has been experiencing left heel pain for awhile now. Noted painful callused area on left heel   Review of Systems  Genitourinary: Positive for frequency.  Musculoskeletal: Positive for myalgias.  All other systems reviewed and are negative.      Objective:   Physical Exam Patient is awake, alert, and oriented x 3.  In no acute distress.  Vascular status is intact with palpable pedal pulses at 1/4 DP and PT bilateral and capillary refill time within normal limits. Neurological sensation is also intact bilaterally via Semmes Weinstein monofilament at 5/5 sites. Light touch, vibratory sensation, Achilles tendon reflex is intact. Dermatological exam reveals well circumscribed porokeratotic lesion on the central aspect of the heel present.  Intact integument post debridement is noted.  Pain with direct pressure is noted.    Musculature intact with dorsiflexion, plantarflexion, inversion, eversion.  xrays show a mild inferior spur.       Assessment & Plan:  Heel pain,  Porokeratotic lesion  Plan:  Debridement of the lesion accomplished.  Padding and salinocaine applied.  He will be seen back as needed.

## 2014-01-07 NOTE — Patient Instructions (Signed)
Corns and Calluses Corns are small areas of thickened skin that usually occur on the top, sides, or tip of a toe. They contain a cone-shaped core with a point that can press on a nerve below. This causes pain. Calluses are areas of thickened skin that usually develop on hands, fingers, palms, soles of the feet, and heels. These are areas that experience frequent friction or pressure. CAUSES  Corns are usually the result of rubbing (friction) or pressure from shoes that are too tight or do not fit properly. Calluses are caused by repeated friction and pressure on the affected areas. SYMPTOMS  A hard growth on the skin.  Pain or tenderness under the skin.  Sometimes, redness and swelling.  Increased discomfort while wearing tight-fitting shoes. DIAGNOSIS  Your caregiver can usually tell what the problem is by doing a physical exam. TREATMENT  Removing the cause of the friction or pressure is usually the only treatment needed. However, sometimes medicines can be used to help soften the hardened, thickened areas. These medicines include salicylic acid plasters and 12% ammonium lactate lotion. These medicines should only be used under the direction of your caregiver. HOME CARE INSTRUCTIONS   Try to remove pressure from the affected area.  You may wear donut-shaped corn pads to protect your skin.  You may use a pumice stone or nonmetallic nail file to gently reduce the thickness of a corn.  Wear properly fitted footwear.  If you have calluses on the hands, wear gloves during activities that cause friction.  If you have diabetes, you should regularly examine your feet. Tell your caregiver if you notice any problems with your feet. SEEK IMMEDIATE MEDICAL CARE IF:   You have increased pain, swelling, redness, or warmth in the affected area.  Your corn or callus starts to drain fluid or bleeds.  You are not getting better, even with treatment. Document Released: 10/28/2003 Document  Revised: 04/15/2011 Document Reviewed: 09/18/2010 Exit[employer] Patient Information 2015 [employer]. This information is not intended to replace advice given to you by your health care provider. Make sure you discuss any questions you have with your health care provider.  

## 2014-01-18 ENCOUNTER — Encounter: Payer: Self-pay | Admitting: Pulmonary Disease

## 2014-01-18 ENCOUNTER — Ambulatory Visit (INDEPENDENT_AMBULATORY_CARE_PROVIDER_SITE_OTHER): Payer: Medicare Other | Admitting: Pulmonary Disease

## 2014-01-18 VITALS — BP 120/60 | HR 60 | Temp 98.5°F | Ht 71.0 in | Wt 169.4 lb

## 2014-01-18 DIAGNOSIS — E7800 Pure hypercholesterolemia, unspecified: Secondary | ICD-10-CM

## 2014-01-18 DIAGNOSIS — D126 Benign neoplasm of colon, unspecified: Secondary | ICD-10-CM

## 2014-01-18 DIAGNOSIS — Z720 Tobacco use: Secondary | ICD-10-CM

## 2014-01-18 DIAGNOSIS — M47817 Spondylosis without myelopathy or radiculopathy, lumbosacral region: Secondary | ICD-10-CM

## 2014-01-18 DIAGNOSIS — I480 Paroxysmal atrial fibrillation: Secondary | ICD-10-CM

## 2014-01-18 DIAGNOSIS — F1010 Alcohol abuse, uncomplicated: Secondary | ICD-10-CM

## 2014-01-18 DIAGNOSIS — R5383 Other fatigue: Secondary | ICD-10-CM

## 2014-01-18 DIAGNOSIS — K5730 Diverticulosis of large intestine without perforation or abscess without bleeding: Secondary | ICD-10-CM

## 2014-01-18 DIAGNOSIS — F17200 Nicotine dependence, unspecified, uncomplicated: Secondary | ICD-10-CM

## 2014-01-18 DIAGNOSIS — I739 Peripheral vascular disease, unspecified: Secondary | ICD-10-CM

## 2014-01-18 DIAGNOSIS — K219 Gastro-esophageal reflux disease without esophagitis: Secondary | ICD-10-CM

## 2014-01-18 DIAGNOSIS — G4700 Insomnia, unspecified: Secondary | ICD-10-CM

## 2014-01-18 DIAGNOSIS — R350 Frequency of micturition: Secondary | ICD-10-CM

## 2014-01-18 DIAGNOSIS — J449 Chronic obstructive pulmonary disease, unspecified: Secondary | ICD-10-CM

## 2014-01-18 MED ORDER — AZITHROMYCIN 250 MG PO TABS: ORAL_TABLET | ORAL | Status: DC

## 2014-01-18 NOTE — Patient Instructions (Signed)
Today we updated your med list in our EPIC system...    Continue your current medications the same...  We decide to treat the sinus congestion & drainage w/    ZPak - take as directed (we gave you some refills if you need this again later on in the winter months)...    MUCINEX - 600mg  OTC tabs - take one tab 4 times daily w/ FLUIDS...  We will arrange for a Urology appt for further evaluation of your urinary frequency...    In the meanwhile you need to QUIT the alcohol due to it's effect on your urination...  Call for any questions...  Let's plan a follow up visit in 4-50months w/ FASTING blood work at that time.Marland KitchenMarland Kitchen

## 2014-01-18 NOTE — Progress Notes (Signed)
Subjective:    Patient ID: Kurt [name], male    DOB: 1940/09/09, 73 y.o.   MRN: [id number]  HPI 73 y/o BM here for a follow up visit... he has multiple medical problems as noted below...    ~  May 30,2012:  47mo ROV & needs decision regarding anticoagulation in advance of planned back surg by DrStern> MRI Lumbar Spine 4/12 showed multilevel degenerative changes severe spinal stenosis L3-4 due to central disc protrusion (prior decompression on right at L4-5 & left at L5-S1 are unchanged)...  He has hx chronic alcoholism & Etoh withdrawal w/ DTs 5/11> during that hospitalization he had transient AFib & was seen by DrBrackbill for Cards, started on Diltiazem, & converted to NSR (no prob since then);  He also had bilat LE DVT & was treated w/ Lovenox ==> Coumadin & has been followed in the Coumadin Clinic now for 70yr; he denies leg pain or swelling etc==> we discussed checking LE VenDopplers & if they are OK then we will stop his Coumadin at this point- he understands that his risk for future DVT is elevated by virtue of his hx of prev DVT but he can minimize that risk by staying active, avoiding trauma, & stopping the alcohol consumption... In this regard he informs me that he is once again smoking 1+ppd & that he was told by the CC that it would be OK to drink a few beers daily if he wanted to- I informed him that it certainly was NOT OK to drink the beer or whatever because he is an alcoholic & this isn't acceptable (wife shaking her head the whole time)...  He denies CP, palpit, dizzy, syncope, SOB/DOE, edema, etc> EKG today shows NSR, & minor NSSTTWA.    Plan is to check VenDopplers & if neg then stop Coumadin & OK to proceed w/ back surgery from the medical standpoint> his greatest risk would appear to be from his alcoholism, continued drinking, risk of withdrawal & DTs;  From the DVT prevention standpoint he should be managed w/ PAS hose, early ambulation & perioperative Lovenox if endorsed by  Neurosurgery...  ~  August 26, 2011:  72mo ROV & here for pre-op clearance for back surg- apparently DrMCohen plans back surg, we don't have Ortho notes but pt states that "he guaranteed 10-12 yrs of being pain free after the operation"... His only other complaints are LTOS including difficulty emptying bladder, urgency, decr force of stream, etc> and we discussed starting FLOMAX 0.$RemoveBeforeDEI'4mg'PzJXQicHEyzRQLiX$  Qd...    COPD> still smoking 1/2ppd & not interested in smoking cessation help; denies cough, sputum, hemoptysis, SOB, etc...    Hx PAF> occurred 5/11 in hosp for pneumonia & etoh withdrawal; converted to NSR on CardizemCD $RemoveBefor'180mg'bVKbuBxmqJuk$ /d & ASA $Rem'81mg'DBUp$ /d; remains in NSR w/o CP, palpit, edema...    Hx DVT> occurred 5/11 in hosp for pneumonia & etoh withdrawal> treated w/ Coumadin x 92yr & stopped 5/12 w/ neg f/u ven dopplers...    Chol> on diet alone, he has refused meds in the past; last FLP on diet was improved w/ LDL=112; needs to ret fasting for f/u FLP...    GI> HYx GERD on OTC Pepcid prn; Hx divertics/ polyps & overdue for f/u colonoscopy; he denies abd pain, n/v, c/d, blood, etc...    Alcoholism> he still drinks daily- admits to 1 can of beer & 1pt of wine ([place] or [employer]) daily; hx of DT's during hosp 5/11 for pneumonia...    GU> LTOS persist despite his  saw palmetto rx; tried Enablex 2012 w/o benefit & he stopped; rec FLOMAX 0.$RemoveBefo'4mg'yypqdcXSlCa$  daily...    Neck & Back pain> he has CSpine spondylosis w/ evals from DrStern; and 3 separate lumbar surgeries over the yrs- known multilevel DDD w/ foraminal stenosis & spinal stenosis at each level (severe at L3-4); he states leg pain L>R; he had planned surg 2012 from    DrStern but he decided to pursue Chiropractic care which he said helped for awhile, then worse, & now seeing DrCohen who plans surg soon (we don't have notes from him)...    Anxiety/ Insomnia> not currently on meds due to his drinking... We reviewed prob list, meds, xrays and labs> see below for updates >>  EKG 5/12  showed NSR, rate80, WNL, NAD...  CXR 7/13 showed COPD w/ scarring at the bases, NAD, no CHF or effusion.Marland Kitchen  LABS 7/13:  Chems- wnl;  CBC- wnl;  TSH=1.16;  PSA=0.41   ~  January 18, 2013:  17 month ROV & Kurt [name] has several somatic complaints, not here for a CPX, head stopped up/ sinus issues, feels weak/ not resting well (wood stove & freq nocturnal urination), urinary urgency,etc... We reviewed the following medical problems during today's office visit >>     COPD> still smoking 1/2ppd & not interested in smoking cessation help; denies cough, sputum, hemoptysis, SOB, etc; rec nasal saline, Mucinex, fluids for the sinus symptoms...    Hx PAF> occurred 5/11 in hosp for pneumonia & etoh withdrawal; converted to NSR on CardizemCD $RemoveBefor'180mg'ytiQRoGJBJlX$ /d & ASA $Rem'81mg'nuTf$ /d; remains in NSR, BP= 104.62 & he CP, palpit, edema...    Hx DVT> occurred 5/11 in hosp for pneumonia & etoh withdrawal> treated w/ Coumadin x 67yr & stopped 5/12 w/ neg f/u ven dopplers...    Chol> on diet alone, he has refused meds in the past; last FLP on diet was improved w/ LDL=112; needs to ret fasting for f/u FLP...    GI> Hx GERD on OTC Pepcid prn; Hx divertics/ polyps & overdue for f/u colonoscopy; he denies abd pain, n/v, c/d, blood, etc...    Alcoholism> he still drinks daily- admits to 1 can of beer & 1pt of wine ([place] or [employer]) daily; hx of DT's during hosp 5/11 for pneumonia...    GU> LTOS persist despite his saw palmetto rx; tried Enablex 2012 w/o benefit & he stopped; rec FLOMAX 0.$RemoveBefo'4mg'ZurHXicGmpn$  daily, if symptoms persits- refer to Urology...    Neck & Back pain> he has CSpine spondylosis w/ evals from DrStern; and 3 separate lumbar surgeries over the yrs- known multilevel DDD w/ foraminal stenosis & spinal stenosis at each level (severe at L3-4); he states leg pain L>R; he had planned surg 2012 from    DrStern but he decided to pursue Chiropractic care which he said helped for awhile, then worse, & now seeing DrCohen ?who plans surg per pt,  now getting PT...    Anxiety/ Insomnia> not currently on meds due to his drinking, we will try Lorazepam $RemoveBefor'1mg'veGhUBQpeRxP$ - 1/2 to 1 tab Tid prn... We reviewed prob list, meds, xrays and labs> see below for updates >> he had the 2014 Flu vaccine in Sept...  LABS 10/14:  Chems- wnl & BS=104;  CBC- wnl;  TSH=1.63  ~  July 19, 2013:  56mo ROV & Kurt [name] is c/o feeling weak & tired but denies CP, palpit, SOB or edema; he notes sl cough, nasal congestion, small amt of yellow sput, no hemoptysis; he notes that his back & legs  are stiff, mult somatic complaints; he continues to smoke & drinks beer & moonshine regularly... We reviewed the following medical problems during today's office visit >>     COPD> still smoking 1/2ppd & not interested in smoking cessation help; denies SOB w/ mild cough/ sputum/ etc; PFT today w/ GOLD 2 COPD & rec to STOP SMOKING & start Dulera100-2spBid...    Hx PAF> occurred 5/11 in hosp for pneumonia & etoh withdrawal; converted to NSR on CardizemCD $RemoveBefor'180mg'QCOMxYfuyeld$ /d & ASA $Rem'81mg'WRzL$ /d; pulse today is sl irreg & EKG shows recurrent AFib, controlled VR=> refer back to Cards...    Hx DVT> occurred 5/11 in hosp for pneumonia & etoh withdrawal> treated w/ Coumadin x 7yr & stopped 5/12 w/ neg f/u ven dopplers...    Chol> on diet alone, he has refused meds in the past; last FLP on diet was improved w/ LDL=112; needs to ret fasting for f/u FLP...    GI> Hx GERD on OTC Pepcid prn; Hx divertics/ polyps & overdue for f/u colonoscopy; he denies abd pain, n/v, c/d, blood, etc...    Alcoholism> he still drinks daily- admits to beer & moonshine daily; hx of DT's during hosp 5/11 for pneumonia...    GU> LTOS improved on FLOMAX0.4    Neck & Back pain> on Tramadol, off Neurontin; he has CSpine spondylosis w/ evals from DrStern; and 3 separate lumbar surgeries over the yrs- known multilevel DDD w/ foraminal stenosis & spinal stenosis at each level (severe at L3-4); he states leg pain L>R; he had planned surg 2012 from DrStern but he  decided to pursue Chiropractic care which he said helped for awhile, then worse, & now seeing DrCohen ?who plans surg per pt, now getting PT...    Anxiety/ Insomnia> not currently on meds due to his drinking, we will try Lorazepam $RemoveBefor'1mg'zWqajsQPzWOM$ - 1/2 to 1 tab Tid prn... We reviewed prob list, meds, xrays and labs> see below for updates >>   CXR 4/15 showed norm heart size, atherosclerotic calcif in Ao, sl incr markings/ COPD, NAD...   PFTs 6/15 showed FVC=3.27 (84%), FEV1= 2.09 (71%), %1sec=64, mid-flows= 44% predicted... This is c/w GOLD stage 2 COPD...  EKG 6/15 showed new onset AFib, rate99, NSSTTWA...  LABS 6/15:  Chems- wnl;  CBC- wnl;  TSH= 1.76... REC> we will start DULERA100-2spBid, encouraged to quit all smoking & drinking; refer to Cards for AFib...  ~  January 18, 2014:  50mo ROV & Kurt [name] persists w/ mult somatic complaints- weak, sinus pressure & green drainage), abd bloating & gas, urinary freq, back pain, etc;  We discussed treatment/plans w/ ZPak, Mucinex, nasal saline, Simethacone & f/u w/ GI-DrStark for overdue colonoscopy, continue flomax0.4 per DrNesi...     He has Albut HFA for prn use but says he hasn't needed it w/ breathing good; denies much cough, sput, SOB, CP, etc; he is too sedentary 7 needs incr exercise program....    On [place]; hx PAF & DVT- stable w/o CP, palpit, leg pain/ swelling/ etc...    He still drinks daily & smokes 1/2ppd- we discussed stopping & the hazards of continued abuse...    On Tramadol, Neurontin, Ibuprofen for his arthritis 7 neck/ back pain followed by DrStern 7 chiropractor... We reviewed prob list, meds, xrays and labs> see below for updates >>           Problem List:  COPD (ICD-496),  CIGARETTE SMOKER (ICD-305.1),  Hx of ABNORMAL CHEST XRAY (ICD-793.1) - +cig smoker & still smoking 1/2ppd (quit  transiently around the time of his hosp 5/11)... hx + for cough & whitish sputum, w/ congestion and chr stable DOE...  denies hemoptysis,  worsening dyspnea, wheezing, chest pains, snoring, daytime hypersomnolence, etc... not currently on any resp meds> ~  Baseline CXR shows COPD, chr interstitial markings, NAD.Marland Kitchen. (no prev CT Chest). ~  Prev PFT's 3/05 showed FVC= 3.58 (79%), FEV1= 3.02 (96%), %1sec=84, mid-flows= 139%pred... lung volumes were normal and DLCO was WNL as well... ~  Hosp 5/11 w/ Etoh & bilat pneumonia w/ resp insuffic >> Disch summary reviewed. ~  CXR 2/12 showed COPD, DJD in spine, NAD.Marland Kitchen. ~  CXR 7/13 showed COPD w/ scarring at the bases, NAD, no CHF or effusion.. ~  12/14:  He continues to smoke regularly ~1/2ppd; we reviewed smoking cessation strategies... ~  CXR 4/15 showed norm heart size, atherosclerotic calcif in Ao, sl incr markings/ COPD, NAD...  ~  PFTs 6/15 showed FVC=3.27 (84%), FEV1= 2.09 (71%), %1sec=64, mid-flows= 44% predicted... This is c/w GOLD stage 2 COPD... Must quit smoking & we decided to start Dulera100-2spBid...  PAROXYSMAL ATRIAL FIBRILLATION (ICD-427.31) - he had AFib in hosp 5/11 due to DTs & bilat pneumonia- he converted to NSR on Cardizem, no prob since then... currently stable on DILTIAZEM CD $RemoveBefo'180mg'NQgXTswoYEi$ /d & holding NSR... ~  6/15: on [place];  EKG showed new onset AFib, rate99, NSSTTWA => referred to Cards...  PERIPHERAL VASCULAR DISEASE (ICD-443.9) - on ASA $Remo'81mg'vFpdB$ /d...  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  DVT (ICD-453.40) - venous dopplers pos for bilat DVT in hosp 5/11> not felt to be a good Coumadin candidate at the time & started on Lovenox, disch to NHP & eventually converted to COUMADIN- followed in the Coumadin Clinic for 108yr w/o problems... ~  5/12:  Repeat VenDiopplers ==> neg for DVT & Coumadin was discontinued... ~  7/13:  He reports no prob w/ recurrent DVT, leg swelling, etc... Exercise is lim by his LBP...  HYPERCHOLESTEROLEMIA (ICD-272.0) - on diet alone... ~  [place] 6/07 showed TChol 219, TG 56, HDL 80, LDL 135 ~  FLP 10/10  showed TChol 197, TG 58, HDL 73, LDL 112  GERD (ICD-530.81) - on OTC Pepcid/ Zantac Prn... EGD in 1998 showed mild esophagitis... ~  5/12:  In light of his continued etoh- he is advised to take Prilosec vs Pepcid daily for mucosal protection...  DIVERTICULOSIS OF COLON (ICD-562.10),  COLONIC POLYPS (ICD-211.3) - last colonoscopy 7/05 by DrStark showed divertics & 32mm polyp= tubular adenoma... f/u planned 5 yrs & he is now overdue...  ALCOHOL ABUSE (ICD-305.00) - admits to daily beer consumption... intermittent mild incr SGOT in past... adm to hosp 5/11 e/ Etoh withdrawal & DTs> he stopped transiently after that... he does not voice understanding of alcoholism, & hasn't joined AA etc, & restarted "beer" stating that the Coumadin Clinic told him it was OK. ~  7/13:  Now states he is drinking 1 can beer/d and 1pt wine daily... Advised to quit completely & join AA. ~  12/14 & 6/15:  He continues to drink daily by his own hx despite all efforts to get him to quit...  URINARY FREQUENCY (ICD-788.41) - on [place] supplement... he c/o freq and nocturia- no burning, no blood, mild LTOS... ~  labs 5/08 showed PSA= 0.70 ~  labs 10/10 showed PSA= 0.51 ~  5/12:  He requests med trial for the frequency> try ENABLEX 7.$RemoveBefor'5mg'EwVESOLKnWkr$  daily (f/u- he reports this  wasn't helpful). ~  7/13:  He describes LTOS & we have rec trial FLOMAX 0.$RemoveBefore'4mg'oUPyPREoZxzFB$  /d... ~  We discussed referral to Urology when he is ready; asked to quit Etoh first to see if it decreases his symptoms...  FATIGUE (ICD-780.79) - on MVI + Vit D 1000/d per daughter... this has been a chronic issue over the yrs...  ORTHO >> shoulder pain, neck pain, back pain...  NECK PAIN (ICD-723.1) - known cervical spondylosis w/ eval from [facility] in 90's and DrStern over the last 10 yrs... 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 last seen by DrStern 7/07 w/ multilevel lumbar DDD w/ foraminal stenosis  and spinal stenosis at each level... currently c/o mod back pain, uses Tylenol Prn, mild assoc leg symptoms... ~  4/12:  Repeat eval by DrStern w/ MRI showing multilevel degenerative changes severe spinal stenosis L3-4 due to central disc protrusion (prior decompression on right at L4-5 & left at L5-S1 are unchanged); they planned surg but pt chose to see Chiropractor & states the adjustments are helping... ~  7/13:  Pt reports the Chiro rx stopped helping & he sought second opinion from [facility]; we don't have notes from him, but pt states that "he guaranteed 10-12 yrs of being pain free after the operation" ~  10/14:  He was evaluated by Dr. Rennis Harding fopr Ortho> diffuse multilevel DDD & spondylosis, they set him up for physical therapy ~  12/14: he has tried Meloxicam15, Tramadol50, and asked to stop smoking 7 drinking...  ANXIETY (ICD-300.00) - not currently on meds...  INSOMNIA, CHRONIC (ICD-307.42) - not currently on meds...   Past Surgical History  Procedure Laterality Date  . 3 seperate lumbar laminectomies    . Right cts release      Dr. Fredna Dow 12/00    Outpatient Encounter Prescriptions as of 01/18/2014  Medication Sig  . albuterol (PROVENTIL HFA;VENTOLIN HFA) 108 (90 BASE) MCG/ACT inhaler Inhale 2 puffs into the lungs every 6 (six) hours as needed for wheezing or shortness of breath.  Marland Kitchen apixaban (ELIQUIS) 5 MG TABS tablet Take 1 tablet (5 mg total) by mouth 2 (two) times daily.  Marland Kitchen diltiazem (CARDIZEM CD) 180 MG 24 hr capsule TAKE 1 CAPSULE BY MOUTH ONCE A DAY  . gabapentin (NEURONTIN) 300 MG capsule Take 1 capsule by mouth Three times a day.  . ibuprofen (ADVIL,MOTRIN) 200 MG tablet Per bottle as needed  . LORazepam (ATIVAN) 1 MG tablet TAKE 1/2 TO 1 TABLET BY MOUTH 3 TIMES A DAY AS NEEDED  . Multiple Vitamins-Minerals (MENS MULTIVITAMIN PLUS PO) Take by mouth. Once a day   . predniSONE (DELTASONE) 50 MG tablet Take 1 tablet (50 mg total) by mouth daily.  . tamsulosin (FLOMAX)  0.4 MG CAPS capsule TAKE 1 CAPSULE BY MOUTH DAILY.  . traMADol (ULTRAM) 50 MG tablet TAKE 1 TABLET EVERY 8 HOURS AS NEEDED FOR PAIN    No Known Allergies   Current Medications, Allergies, Past Medical History, Past Surgical History, Family History, and Social History were reviewed in [employer] record.   Review of Systems        See HPI - all other systems neg except as noted... The patient complains of mild dyspnea on exertion, & back pain  The patient denies anorexia, fever, weight loss, weight gain, vision loss, decreased hearing, hoarseness, chest pain, syncope, peripheral edema, prolonged cough, headaches, hemoptysis, abdominal pain, melena, hematochezia, severe indigestion/heartburn, hematuria, incontinence, muscle weakness,  suspicious skin lesions, transient blindness, difficulty walking, depression, unusual weight change, abnormal bleeding, enlarged lymph nodes, and angioedema.   Objective:   Physical Exam    WD, WN, 73 y/o BM in NAD... Vital Signs:  Reviewed... GENERAL:  Alert & oriented; pleasant & cooperative... HEENT:  [place]/AT, EOM-wnl, PERRLA, EACs-clear, TMs-wnl, NOSE-clear, THROAT-clear & wnl. NECK:  Supple w/ fairROM; no JVD; normal carotid impulses w/o bruits; no thyromegaly or nodules palpated; no lymphadenopathy. CHEST:  Clear to P & A; without wheezes/ rales/ or rhonchi heard... HEART:  Regular Rhythm; without murmurs/ rubs/ or gallops detected... ABDOMEN:  Soft & nontender; normal bowel sounds; no organomegaly or masses palpated... EXT: without deformities, mild arthritic changes; no varicose veins/ +venous insuffic/ no edema. BACK:  scar of prev lumbar laminectomies... SLR is neg... NEURO:  CN's intact; motor testing normal; sensory testing normal; gait normal & balance OK. DERM:  No lesions noted; no rash etc...  RADIOLOGY DATA:  Reviewed in the EPIC EMR & discussed w/ the patient...  LABORATORY DATA:  Reviewed in the EPIC EMR & discussed  w/ the patient...   Assessment & Plan:    COPD>  He continues to smoke 1/2ppd & not interested in smoking cessation help etc;  Neither is he interested in any breathing medication "my breathing is fine, no problem"... 12/15> Rx for URI/ snusitis w/ ZPak, Mucinex, nasal saline...  Hx PAF>  This was transient & related to his DTs & bilat pneumonia during the 5/11 hosp;  He remains on Diltiazem & holding NSR;  DrBrackbill's consult note is reviewed...  Hx DVT>  He's was on Coumadin x1y & f/u ven dopplers 5/12 was neg= therefore Coumadin stopped; he denies any prob w/ DVT, leg swelling, etc; he knows to avoid Etoh, avoid leg trauma, stay active, etc...  CHOL>  He needs to ret for fasting lipid profile follow up...  GERD>  He is rec to take either PRILOSEC $RemoveBeforeD'20mg'fdpPWVjxrRvEHY$  OTC daily or PEPCID $RemoveB'20mg'bxIBjRjk$  daily for mucosal protection in light of his chr alcoholism...  Etoh>  He is advised that it is NOT ok to drink beer or wine & that he is a chronic alcoholic so it is not acceptable to consume any alcohol & that he desperately needs to join AA etc...  Urinary Freq>  Given trial Enablex w/o benefit; try FLOMAX 0.$RemoveBefo'4mg'vZfTxayAWGv$ /d at this point & next option is Urology referral...  BACK PAIN>  S/p mult surg & DrCohen plans more surg for HNP w/ spinal stenosis...  Anxiety/ Insomnia>  As noted...   Patient's Medications  New Prescriptions   AZITHROMYCIN (ZITHROMAX) 250 MG TABLET    Take as directed per package  Previous Medications   ALBUTEROL (PROVENTIL HFA;VENTOLIN HFA) 108 (90 BASE) MCG/ACT INHALER    Inhale 2 puffs into the lungs every 6 (six) hours as needed for wheezing or shortness of breath.   APIXABAN (ELIQUIS) 5 MG TABS TABLET    Take 1 tablet (5 mg total) by mouth 2 (two) times daily.   DILTIAZEM (CARDIZEM CD) 180 MG 24 HR CAPSULE    TAKE 1 CAPSULE BY MOUTH ONCE A DAY   GABAPENTIN (NEURONTIN) 300 MG CAPSULE    Take 1 capsule by mouth Three times a day.   GUAIFENESIN (MUCINEX) 600 MG 12 HR TABLET    Take 600 mg  by mouth 4 (four) times daily.   IBUPROFEN (ADVIL,MOTRIN) 200 MG TABLET    Per bottle as needed   LORAZEPAM (ATIVAN) 1 MG TABLET    TAKE 1/2  TO 1 TABLET BY MOUTH 3 TIMES A DAY AS NEEDED   TAMSULOSIN (FLOMAX) 0.4 MG CAPS CAPSULE    TAKE 1 CAPSULE BY MOUTH DAILY.   TRAMADOL (ULTRAM) 50 MG TABLET    TAKE 1 TABLET EVERY 8 HOURS AS NEEDED FOR PAIN  Modified Medications   No medications on file  Discontinued Medications   MULTIPLE VITAMINS-MINERALS (MENS MULTIVITAMIN PLUS PO)    Take by mouth. Once a day    PREDNISONE (DELTASONE) 50 MG TABLET    Take 1 tablet (50 mg total) by mouth daily.

## 2014-01-24 ENCOUNTER — Telehealth: Payer: Self-pay | Admitting: *Deleted

## 2014-01-24 ENCOUNTER — Encounter: Payer: Self-pay | Admitting: Physician Assistant

## 2014-01-24 ENCOUNTER — Ambulatory Visit (INDEPENDENT_AMBULATORY_CARE_PROVIDER_SITE_OTHER): Payer: Medicare Other | Admitting: Physician Assistant

## 2014-01-24 VITALS — BP 114/72 | HR 72 | Ht 68.0 in | Wt 172.1 lb

## 2014-01-24 DIAGNOSIS — Z1211 Encounter for screening for malignant neoplasm of colon: Secondary | ICD-10-CM

## 2014-01-24 DIAGNOSIS — Z7901 Long term (current) use of anticoagulants: Secondary | ICD-10-CM

## 2014-01-24 DIAGNOSIS — Z86010 Personal history of colonic polyps: Secondary | ICD-10-CM

## 2014-01-24 MED ORDER — MOVIPREP 100 G PO SOLR: 1.0000 | ORAL | Status: DC

## 2014-01-24 NOTE — Progress Notes (Signed)
Patient ID: Kurt [name], male   DOB: 07/10/1940, 73 y.o.   MRN: [id number]   Subjective:    Patient ID: Kurt [name], male    DOB: 11-28-1940, 73 y.o.   MRN: [id number]  HPI Kurt [name] is a 73 year old African-American male known to Dr. Fuller Plan from prior colonoscopies. He is referred today by Dr. Lenna Gilford  for follow-up colonoscopy. He has history of COPD, peripheral vascular disease, atrial fibrillation for which she is on eliquis's. He has hyperlipidemia very remote DVT and prior history of EtOH abuse. He is followed by Dr. Hochrein/cardiology. Last colonoscopy was done October 2010 at that time he had 4 polyps removed the largest was 7 mm in the sigmoid colon. He was also noted to have moderate diverticulosis and internal hemorrhoids  Path  from the polypectomies  showed tubular adenomas and hyperplastic polyps. He is due for 5 year follow-up. He has no current GI complaints, specifically no problems with abdominal pain melena or hematochezia. He does have some problems with gassiness and intermittent indigestion. He complains of persistent fatigue as well. He is agreeable to having follow-up colonoscopy but says she will not do any more beyond this one. Family history is negative for colon cancer.  Review of Systems Pertinent positive and negative review of systems were noted in the above HPI section.  All other review of systems was otherwise negative.  Outpatient Encounter Prescriptions as of 01/24/2014  Medication Sig  . albuterol (PROVENTIL HFA;VENTOLIN HFA) 108 (90 BASE) MCG/ACT inhaler Inhale 2 puffs into the lungs every 6 (six) hours as needed for wheezing or shortness of breath.  Marland Kitchen apixaban (ELIQUIS) 5 MG TABS tablet Take 1 tablet (5 mg total) by mouth 2 (two) times daily.  Marland Kitchen azithromycin (ZITHROMAX) 250 MG tablet Take as directed per package  . diltiazem (CARDIZEM CD) 180 MG 24 hr capsule TAKE 1 CAPSULE BY MOUTH ONCE A DAY  . gabapentin (NEURONTIN) 300 MG capsule Take 1 capsule by  mouth Three times a day.  Marland Kitchen guaiFENesin (MUCINEX) 600 MG 12 hr tablet Take 600 mg by mouth 4 (four) times daily.  Marland Kitchen ibuprofen (ADVIL,MOTRIN) 200 MG tablet Per bottle as needed  . LORazepam (ATIVAN) 1 MG tablet TAKE 1/2 TO 1 TABLET BY MOUTH 3 TIMES A DAY AS NEEDED  . tamsulosin (FLOMAX) 0.4 MG CAPS capsule TAKE 1 CAPSULE BY MOUTH DAILY.  . traMADol (ULTRAM) 50 MG tablet TAKE 1 TABLET EVERY 8 HOURS AS NEEDED FOR PAIN  . MOVIPREP 100 G SOLR Take 1 kit (200 g total) by mouth as directed.   No Known Allergies Patient Active Problem List   Diagnosis Date Noted  . Depression 01/21/2012  . Pre-op exam 07/04/2010  . Encounter for long-term (current) use of anticoagulants 05/02/2010  . PNEUMONIA 11/12/2009  . PAROXYSMAL ATRIAL FIBRILLATION 10/31/2009  . DVT 08/17/2009  . COLONIC POLYPS 12/21/2007  . HYPERCHOLESTEROLEMIA 12/21/2007  . ANXIETY 12/21/2007  . Alcohol abuse 12/21/2007  . CIGARETTE SMOKER 12/21/2007  . INSOMNIA, CHRONIC 12/21/2007  . Peripheral vascular disease 12/21/2007  . Diverticulosis of large intestine 12/21/2007  . LUMBOSACRAL SPONDYLOSIS WITHOUT MYELOPATHY 12/21/2007  . NECK PAIN 12/21/2007  . Fatigue 12/21/2007  . URINARY FREQUENCY 12/21/2007  . Nonspecific (abnormal) findings on radiological and other examination of body structure 12/21/2007  . ABNORMAL CHEST XRAY 12/21/2007  . COPD mixed type 07/24/2007  . GERD 07/24/2007   History   Social History  . Marital Status: Married    Spouse Name: N/A    Number  of Children: 2  . Years of Education: N/A   Occupational History  . Reitred from [employer]   .     Social History Main Topics  . Smoking status: Current Every Day Smoker -- 1.50 packs/day for 54 years    Types: Cigarettes  . Smokeless tobacco: Former Systems developer    Types: Chew     Comment:  and still smokes sometimes but not everyday  . Alcohol Use: No  . Drug Use: No  . Sexual Activity: Not on file   Other Topics Concern  . Not on file   Social History  Narrative   Lives with wife.      Mr. Rigg family history includes Hypertension in his sister; Prostate cancer in his father.      Objective:    Filed Vitals:   01/24/14 1330  BP: 114/72  Pulse: 72    Physical Exam  well-developed older African-American male in no acute distress, height 5 foot 8 weight 172 BMI 26. HEENT; nontraumatic normocephalic EOMI PERRLA sclera anicteric, Supple; no JVD, Cardiovascular; regular rate and rhythm with S1-S2 no  significant murmur rub or gallop, Pulmonary; clear bilaterally somewhat decreased breath sounds, Abdomen ;soft nontender nondistended bowel sounds are active there is no palpable mass or hepatosplenomegaly, Rectal; exam not done, Extremities; no clubbing cyanosis or edema skin warm and dry, Psych; mood and affect appropriate     Assessment & Plan:   #1 73 yo AA male with history of adenomatous colon polyps due for 5 year interval follow-up. He is currently asymptomatic . #2 chronic anticoagulation with Eliquis #3 history of atrial fibrillation paroxysmal #4 peripheral vascular disease #5 COPD #6 diverticulosis  Plan; Will schedule for colonoscopy with Dr. Fuller Plan. Procedure discussed in detail with patient and he is agreeable to proceed. We will obtain consent from his cardiologist Dr. Percival Spanish  for patient hold Eliquis for 48 hours prior to his procedure.  Selah Zelman Genia Harold PA-C 01/24/2014

## 2014-01-24 NOTE — Telephone Encounter (Signed)
01/24/2014   RE: Kurt [name] DOB: 08-31-1940 MRN: [id number]   Dear Tonia Brooms Hochrein,    We have scheduled the above patient for an endoscopic procedure. Our records show that he is on anticoagulation therapy.   Please advise as to how long the patient may come off his therapy of Eliquis prior to the procedure, which is scheduled for 03-08-2014.  Please fax back/ or route the completed form to [facility] at .[phone].   Sincerely,    Amy Esterwood PA-C     Murlean Seelye CM

## 2014-01-24 NOTE — Patient Instructions (Signed)
You have been scheduled for a colonoscopy. Please follow written instructions given to you at your visit today.  We have given you a sample prep for the colonopscopy. If you use inhalers (even only as needed), please bring them with you on the day of your procedure.  We will call you once we hear from Dr. Percival Spanish. He may tell you to hold the Eliquis medication 2 days prior to the procedure date and including the procedure date of 03-08-2014. We will let you know his instructions.

## 2014-01-25 NOTE — Progress Notes (Signed)
Reviewed and agree with management plan.  Sharifah Champine T. Aamira Bischoff, MD FACG 

## 2014-01-26 NOTE — Telephone Encounter (Signed)
The patient is OK to hold his Eliquis as needed for the planned procedure and to resume afterward per GI MD.

## 2014-01-26 NOTE — Telephone Encounter (Signed)
I called and tried to tell the pt his Eliquis instructions. He told me to please call his daughter Marlowe Kays with them.  I called and Lm for her to let her Dad know that he is to hold the Eliquis on 1-30 2016 and hold it on 2-1 and 2-2.  He is to resume it on 03-09-2014.  I asked her to please call me and let me know she got the message for her father.

## 2014-01-31 NOTE — Telephone Encounter (Signed)
Spoke with Kurt [name] and she confirmed she received the message.

## 2014-02-17 DIAGNOSIS — N401 Enlarged prostate with lower urinary tract symptoms: Secondary | ICD-10-CM | POA: Diagnosis not present

## 2014-02-17 DIAGNOSIS — N3281 Overactive bladder: Secondary | ICD-10-CM | POA: Diagnosis not present

## 2014-02-21 ENCOUNTER — Telehealth: Payer: Self-pay | Admitting: Cardiology

## 2014-02-21 NOTE — Telephone Encounter (Signed)
Pt. Informed that it was first of the deductable, that the cost will go down after that is paid, pt. Stated understanding

## 2014-02-21 NOTE — Telephone Encounter (Signed)
Pt went to pharmacy and Eliquis was $200 instead of $60 for 30 day supply, takes bid, pt wants to know why it's more??? Pls call

## 2014-02-23 ENCOUNTER — Telehealth: Payer: Self-pay | Admitting: Cardiology

## 2014-02-23 ENCOUNTER — Telehealth: Payer: Self-pay

## 2014-02-23 NOTE — Telephone Encounter (Signed)
Pt need some samples of Eliquis please.-Disregard this message.

## 2014-02-23 NOTE — Telephone Encounter (Signed)
Pt would like some samples of Eliquis please. °

## 2014-02-23 NOTE — Telephone Encounter (Signed)
Returned call to patient to inform him that we do not have any eliquis samples available. Appears as if he has to meet his deductible in order for the price of the Rx to decrease. He is aware of this. He states he will go pick up Rx from pharmacy. Informed him he can contact our office for samples in the future as he needs them. He voiced understanding

## 2014-02-23 NOTE — Telephone Encounter (Signed)
Patient called for eliquis 5 mg  We did not have any but I gave him 3 pks of 2.5 mg and told him to take 2 tablets by mouth twice a day

## 2014-02-24 ENCOUNTER — Encounter: Payer: Self-pay | Admitting: Gastroenterology

## 2014-03-08 ENCOUNTER — Ambulatory Visit (AMBULATORY_SURGERY_CENTER): Payer: 59 | Admitting: Gastroenterology

## 2014-03-08 ENCOUNTER — Encounter: Payer: Self-pay | Admitting: Gastroenterology

## 2014-03-08 VITALS — BP 120/60 | HR 80 | Temp 97.0°F | Resp 25 | Ht 68.0 in | Wt 172.0 lb

## 2014-03-08 DIAGNOSIS — D125 Benign neoplasm of sigmoid colon: Secondary | ICD-10-CM

## 2014-03-08 DIAGNOSIS — D123 Benign neoplasm of transverse colon: Secondary | ICD-10-CM

## 2014-03-08 DIAGNOSIS — Z86010 Personal history of colonic polyps: Secondary | ICD-10-CM | POA: Diagnosis not present

## 2014-03-08 DIAGNOSIS — K635 Polyp of colon: Secondary | ICD-10-CM | POA: Diagnosis not present

## 2014-03-08 MED ORDER — SODIUM CHLORIDE 0.9 % IV SOLN: 500.0000 mL | INTRAVENOUS | Status: DC

## 2014-03-08 NOTE — Progress Notes (Signed)
Procedure ends, to recovery, report given and VSS. 

## 2014-03-08 NOTE — Progress Notes (Signed)
Called to room to assist during endoscopic procedure.  Patient ID and intended procedure confirmed with present staff. Received instructions for my participation in the procedure from the performing physician.  

## 2014-03-08 NOTE — Patient Instructions (Signed)
YOU HAD AN ENDOSCOPIC PROCEDURE TODAY AT [facility]: Refer to the procedure report that was given to you for any specific questions about what was found during the examination.  If the procedure report does not answer your questions, please call your gastroenterologist to clarify.  If you requested that your care partner not be given the details of your procedure findings, then the procedure report has been included in a sealed envelope for you to review at your convenience later.  YOU SHOULD EXPECT: Some feelings of bloating in the abdomen. Passage of more gas than usual.  Walking can help get rid of the air that was put into your GI tract during the procedure and reduce the bloating. If you had a lower endoscopy (such as a colonoscopy or flexible sigmoidoscopy) you may notice spotting of blood in your stool or on the toilet paper. If you underwent a bowel prep for your procedure, then you may not have a normal bowel movement for a few days.  DIET: Your first meal following the procedure should be a light meal and then it is ok to progress to your normal diet.  A half-sandwich or bowl of soup is an example of a good first meal.  Heavy or fried foods are harder to digest and may make you feel nauseous or bloated.  Likewise meals heavy in dairy and vegetables can cause extra gas to form and this can also increase the bloating.  Drink plenty of fluids but you should avoid alcoholic beverages for 24 hours.Try to eat a high fiber diet due to your Diverticulosis.  ACTIVITY: Your care partner should take you home directly after the procedure.  You should plan to take it easy, moving slowly for the rest of the day.  You can resume normal activity the day after the procedure however you should NOT DRIVE or use heavy machinery for 24 hours (because of the sedation medicines used during the test).    SYMPTOMS TO REPORT IMMEDIATELY: A gastroenterologist can be reached at any hour.  During normal  business hours, 8:30 AM to 5:00 PM Monday through Friday, call [phone].  After hours and on weekends, please call the GI answering service at [phone] who will take a message and have the physician on call contact you.   Following lower endoscopy (colonoscopy or flexible sigmoidoscopy):  Excessive amounts of blood in the stool  Significant tenderness or worsening of abdominal pains  Swelling of the abdomen that is new, acute  Fever of 100F or higher  FOLLOW UP: If any biopsies were taken you will be contacted by phone or by letter within the next 1-3 weeks.  Call your gastroenterologist if you have not heard about the biopsies in 3 weeks.  Our staff will call the home number listed on your records the next business day following your procedure to check on you and address any questions or concerns that you may have at that time regarding the information given to you following your procedure. This is a courtesy call and so if there is no answer at the home number and we have not heard from you through the emergency physician on call, we will assume that you have returned to your regular daily activities without incident.  SIGNATURES/CONFIDENTIALITY: You and/or your care partner have signed paperwork which will be entered into your electronic medical record.  These signatures attest to the fact that that the information above on your After Visit Summary has been reviewed and  is understood.  Full responsibility of the confidentiality of this discharge information lies with you and/or your care-partner.  HOLD aspirin and NSAIDS for two weeks due to the hot removal of polyps.  You may resume your Eliquis in three days per Dr. Fuller Plan.

## 2014-03-08 NOTE — Op Note (Signed)
[place]  [address] [place], [id number]   COLONOSCOPY PROCEDURE REPORT PATIENT: Kurt [name], Kurt [name]  MR#: [id number] BIRTHDATE: 07/06/1940 , 73  yrs. old GENDER: male ENDOSCOPIST: Ladene Artist, MD, [facility] PROCEDURE DATE:  03/08/2014 PROCEDURE:   Colonoscopy with snare polypectomy First Screening Colonoscopy - Avg.  risk and is 50 yrs.  old or older - No.  Prior Negative Screening - Now for repeat screening. N/A  History of Adenoma - Now for follow-up colonoscopy & has been > or = to 3 yrs.  Yes hx of adenoma.  Has been 3 or more years since last colonoscopy.  Polyps Removed Today? Yes. ASA CLASS:   Class III INDICATIONS:surveillance colonoscopy based on a history of adenomatous colonic polyp(s). MEDICATIONS: Monitored anesthesia care and Propofol 300 mg IV DESCRIPTION OF PROCEDURE:   After the risks benefits and alternatives of the procedure were thoroughly explained, informed consent was obtained.  The digital rectal exam revealed no abnormalities of the rectum.   The [id number]  endoscope was introduced through the anus and advanced to the cecum, which was identified by both the appendix and ileocecal valve. No adverse events experienced.   The quality of the prep was good, using MoviPrep  The instrument was then slowly withdrawn as the colon was fully examined.  COLON FINDINGS: A sessile polyp measuring 5 mm in size was found in the transverse colon.  A polypectomy was performed with a cold snare.  The resection was complete, the polyp tissue was completely retrieved and sent to histology.  A semi-pedunculated polyp measuring 8 mm in size was found in the sigmoid colon.  A polypectomy was performed using snare cautery. The resection was complete, the polyp tissue was completely retrieved and sent to histology. There was moderate diverticulosis noted in the sigmoid colon and descending colon with associated colonic spasm, petechiae and muscular  hypertrophy. The examination was otherwise normal. Retroflexed views revealed internal Grade I hemorrhoids. The time to cecum=2 minutes 08 seconds.  Withdrawal time=13 minutes 08 seconds.  The scope was withdrawn and the procedure completed. COMPLICATIONS: There were no immediate complications. ENDOSCOPIC IMPRESSION: 1.   Sessile polyp in the transverse colon; polypectomy performed with a cold snare 2.   Semi-pedunculated polyp in the sigmoid colon; polypectomy performed using snare cautery 3.   Moderate diverticulosis noted in the sigmoid colon and descending colon 4.   Grade I internal hemorrhodis  RECOMMENDATIONS: 1.  Hold Aspirin and all other NSAIDS for 2 weeks. 2.  Await pathology results 3.  High fiber diet with liberal fluid intake. 4.  Given your age, you will not need another colonoscopy for colon cancer screening or polyp surveillance.  These types of tests usually stop around the age 104. 5.  Resume Eliquis in 3 days  eSigned:  Ladene Artist, MD, [place] 03/08/2014 2:40 PM

## 2014-03-09 ENCOUNTER — Telehealth: Payer: Self-pay

## 2014-03-09 NOTE — Telephone Encounter (Signed)
  Follow up Call-  Call back number 03/08/2014  Post procedure Call Back phone  # [phone]  Permission to leave phone message Yes     Patient questions:  Do you have a fever, pain , or abdominal swelling? No. Pain Score  0 *  Have you tolerated food without any problems? Yes.    Have you been able to return to your normal activities? Yes.    Do you have any questions about your discharge instructions: Diet   No. Medications  No. Follow up visit  No.  Do you have questions or concerns about your Care? No.  Actions: * If pain score is 4 or above: No action needed, pain <4.

## 2014-03-15 ENCOUNTER — Encounter: Payer: Self-pay | Admitting: Gastroenterology

## 2014-03-24 DIAGNOSIS — N3281 Overactive bladder: Secondary | ICD-10-CM | POA: Diagnosis not present

## 2014-03-24 DIAGNOSIS — R350 Frequency of micturition: Secondary | ICD-10-CM | POA: Diagnosis not present

## 2014-03-24 DIAGNOSIS — N401 Enlarged prostate with lower urinary tract symptoms: Secondary | ICD-10-CM | POA: Diagnosis not present

## 2014-03-24 DIAGNOSIS — R351 Nocturia: Secondary | ICD-10-CM | POA: Diagnosis not present

## 2014-04-05 ENCOUNTER — Telehealth: Payer: Self-pay | Admitting: Pulmonary Disease

## 2014-04-05 NOTE — Telephone Encounter (Signed)
Pt's pharmacy called. Needs refill on Gabapentin. Another MD was prescribing this for him. Wants SN to take over.  SN - please advise. Thanks.

## 2014-04-06 ENCOUNTER — Other Ambulatory Visit: Payer: Self-pay | Admitting: Pulmonary Disease

## 2014-04-06 MED ORDER — GABAPENTIN 300 MG PO CAPS: 300.0000 mg | ORAL_CAPSULE | Freq: Three times a day (TID) | ORAL | Status: DC

## 2014-04-06 NOTE — Telephone Encounter (Signed)
Called and spoke with pt and he is aware of meds that have been called to the pharmacy.

## 2014-04-11 ENCOUNTER — Telehealth: Payer: Self-pay | Admitting: Pulmonary Disease

## 2014-04-11 NOTE — Telephone Encounter (Signed)
Spoke with pt, he is requesting muscle relaxers and pain medications.  Pt states he does not have a new injury to require this med, just has lots of general pain worse when he wakes up in the morning.  Pt aware that he may have to pick up med. Uses [facility] on [address].    Last ov: 01/18/14 Next ov: 07/21/14  Dr. Lenna Gilford please advise.  Thanks!  No Known Allergies Current Outpatient Prescriptions on File Prior to Visit  Medication Sig Dispense Refill  . albuterol (PROVENTIL HFA;VENTOLIN HFA) 108 (90 BASE) MCG/ACT inhaler Inhale 2 puffs into the lungs every 6 (six) hours as needed for wheezing or shortness of breath. (Patient not taking: Reported on 03/08/2014) 1 Inhaler 2  . apixaban (ELIQUIS) 5 MG TABS tablet Take 1 tablet (5 mg total) by mouth 2 (two) times daily. 60 tablet 11  . azithromycin (ZITHROMAX) 250 MG tablet Take as directed per package 6 tablet 2  . diltiazem (CARDIZEM CD) 180 MG 24 hr capsule TAKE 1 CAPSULE BY MOUTH ONCE A DAY 30 capsule 2  . gabapentin (NEURONTIN) 300 MG capsule Take 1 capsule (300 mg total) by mouth 3 (three) times daily. 90 capsule 5  . guaiFENesin (MUCINEX) 600 MG 12 hr tablet Take 600 mg by mouth 4 (four) times daily.    Marland Kitchen ibuprofen (ADVIL,MOTRIN) 200 MG tablet Per bottle as needed    . LORazepam (ATIVAN) 1 MG tablet TAKE 1/2 TO 1 TABLET BY MOUTH 3 TIMES A DAY AS NEEDED 90 tablet 5  . tamsulosin (FLOMAX) 0.4 MG CAPS capsule TAKE 1 CAPSULE BY MOUTH DAILY. 30 capsule 5  . traMADol (ULTRAM) 50 MG tablet TAKE 1 TABLET EVERY 8 HOURS AS NEEDED FOR PAIN 60 tablet 1   No current facility-administered medications on file prior to visit.

## 2014-04-11 NOTE — Telephone Encounter (Signed)
Called pt and received message NA and to try my call again later. WCB

## 2014-04-12 MED ORDER — TRAMADOL HCL 50 MG PO TABS: 50.0000 mg | ORAL_TABLET | Freq: Three times a day (TID) | ORAL | Status: DC | PRN

## 2014-04-12 MED ORDER — METHOCARBAMOL 500 MG PO TABS: 500.0000 mg | ORAL_TABLET | Freq: Three times a day (TID) | ORAL | Status: DC | PRN

## 2014-04-12 NOTE — Telephone Encounter (Signed)
Pt is aware of the below information. Will have someone come and pick up his prescriptions.

## 2014-04-12 NOTE — Telephone Encounter (Signed)
SN requesting to know who the pts PCP is?  Has he set up with a new PCP?  thanks

## 2014-04-12 NOTE — Telephone Encounter (Signed)
Per SN---  Ok for the robaxin 500 mg  #90  1 po tid prn muscle spasms with no refill Tramadol 50 mg  #90  1 po tid prn pain with no refills. These have been printed out and placed on SN cart to be signed and will place up front to pick up for the pt This will give the pt enough time to find a new PCP but the pt can call SN for any issues with his breathing.  thanks

## 2014-04-12 NOTE — Telephone Encounter (Signed)
Spoke with pt. States that he has not found a new PCP. Advised him that SN is no longer doing primary care. States that he "is not fooling with another doctor."  SN - please advise.  Thanks.

## 2014-04-14 ENCOUNTER — Telehealth: Payer: Self-pay | Admitting: Pulmonary Disease

## 2014-04-14 NOTE — Telephone Encounter (Signed)
Pt would like to tranfer to you from Dr Lenna Gilford.  Would this be ok?

## 2014-04-15 NOTE — Telephone Encounter (Signed)
Fine with me

## 2014-04-26 ENCOUNTER — Other Ambulatory Visit: Payer: Self-pay | Admitting: Pulmonary Disease

## 2014-04-27 ENCOUNTER — Ambulatory Visit: Payer: Self-pay | Admitting: Podiatrist

## 2014-05-18 NOTE — Telephone Encounter (Signed)
Appointment 5/19 daughter aware

## 2014-05-18 NOTE — Telephone Encounter (Signed)
Following up, left message for pt to call back.

## 2014-06-23 ENCOUNTER — Ambulatory Visit: Payer: 59 | Admitting: Internal Medicine

## 2014-07-18 ENCOUNTER — Telehealth: Payer: Self-pay | Admitting: Pulmonary Disease

## 2014-07-18 NOTE — Telephone Encounter (Signed)
Called pt and received message the wireless customer I am trying to call is not available. No option to leave VM [facility]

## 2014-07-19 NOTE — Telephone Encounter (Signed)
atc pt, no option to leave vm.  Wcb.

## 2014-07-20 ENCOUNTER — Ambulatory Visit (INDEPENDENT_AMBULATORY_CARE_PROVIDER_SITE_OTHER): Payer: Medicare Other | Admitting: Internal Medicine

## 2014-07-20 ENCOUNTER — Encounter: Payer: Self-pay | Admitting: Internal Medicine

## 2014-07-20 VITALS — BP 116/76 | HR 82 | Temp 97.9°F | Wt 166.0 lb

## 2014-07-20 DIAGNOSIS — J449 Chronic obstructive pulmonary disease, unspecified: Secondary | ICD-10-CM

## 2014-07-20 DIAGNOSIS — I480 Paroxysmal atrial fibrillation: Secondary | ICD-10-CM

## 2014-07-20 DIAGNOSIS — I82401 Acute embolism and thrombosis of unspecified deep veins of right lower extremity: Secondary | ICD-10-CM | POA: Diagnosis not present

## 2014-07-20 DIAGNOSIS — K219 Gastro-esophageal reflux disease without esophagitis: Secondary | ICD-10-CM

## 2014-07-20 DIAGNOSIS — I739 Peripheral vascular disease, unspecified: Secondary | ICD-10-CM

## 2014-07-20 DIAGNOSIS — E7800 Pure hypercholesterolemia, unspecified: Secondary | ICD-10-CM

## 2014-07-20 DIAGNOSIS — M47817 Spondylosis without myelopathy or radiculopathy, lumbosacral region: Secondary | ICD-10-CM

## 2014-07-20 DIAGNOSIS — F411 Generalized anxiety disorder: Secondary | ICD-10-CM

## 2014-07-20 DIAGNOSIS — R350 Frequency of micturition: Secondary | ICD-10-CM

## 2014-07-20 DIAGNOSIS — G4700 Insomnia, unspecified: Secondary | ICD-10-CM

## 2014-07-20 NOTE — Assessment & Plan Note (Signed)
Chronic He has not found anything that works and does not want any other treatment at this time

## 2014-07-20 NOTE — Progress Notes (Signed)
Pre visit review using our clinic review tool, if applicable. No additional management support is needed unless otherwise documented below in the visit note. 

## 2014-07-20 NOTE — Assessment & Plan Note (Signed)
Discussed avoiding his triggers He is not interested in treatment at this time

## 2014-07-20 NOTE — Assessment & Plan Note (Signed)
Diet controlled.  

## 2014-07-20 NOTE — Assessment & Plan Note (Signed)
History of this No reoccurance

## 2014-07-20 NOTE — Telephone Encounter (Signed)
Spoke with pt. OV with SN has already been canceled. Webb Silversmith is now his PCP. Nothing further was needed.

## 2014-07-20 NOTE — Assessment & Plan Note (Signed)
Advised him to quit smoking He is not interested at this time

## 2014-07-20 NOTE — Assessment & Plan Note (Signed)
Chronic He does see a neurosurgeon Injections haven't helped Continue Neurontin, Robaxin, and Tramadol

## 2014-07-20 NOTE — Assessment & Plan Note (Signed)
No difficulty breathing at this time He continues to smoke

## 2014-07-20 NOTE — Patient Instructions (Signed)

## 2014-07-20 NOTE — Assessment & Plan Note (Signed)
-   Continue Flomax 

## 2014-07-20 NOTE — Assessment & Plan Note (Signed)
He denies this Will continue to monitor

## 2014-07-20 NOTE — Assessment & Plan Note (Signed)
Controlled on Cardizem and Eliquis Advised him not to stop these medications secondary to increased risk for stroke

## 2014-07-20 NOTE — Progress Notes (Signed)
HPI  Pt presents to the clinic today to establish care. He is transferring care from Dr. Lenna Gilford.   COPD: He has Albuterol but does not use it. He does continue to smoke 1.5 ppd.  AFIB:Paroxysmal. He takes Cardizem and Eliquis daily. He does not think he needs these medications and he no longer wants to see cardiology. 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 He did have a DVT 4 years ago.  HLD: He does not know the last time his cholesterol was checked. There are no results to review in Epic. He does try to consume a low fat diet.   GERD: This is occasional. He does have some associated bloating. He reports that it is not bad enough to take anything for it.   PVD: He denies any history of this.  Insomnia: He reports he only sleeps 3-4 hours per night. He has trouble falling asleep. He can not turn his mind off he has tried OTC medications but they did not seem to help so he stopped taking them.  BPH: Voids fine on Flomax.  Anxiety: He reports he is not anxious. He does not take the Ativan.  Chronic low back pain: This is severe. The pain starts in his lower back and radiates down his leg. He is taking Gabapentin but not sure if it helps. He takes Tramadol and Robaxin as needed.  Flu: 2015 Tetanus: unsure of his last one Pneumovax: 10/2012 Prevnar: unsure Zostovax: unsure Colon Screening: 03/2014 PSA Screening: 2012 Vision Screening: yearly Dentist: as needed  Past Medical History  Diagnosis Date  . COPD (chronic obstructive pulmonary disease)   . Cigarette smoker   . Abnormal chest x-ray   . History of pneumonia   . Atrial fibrillation   . DVT (deep venous thrombosis)   . Hypercholesterolemia   . GERD (gastroesophageal reflux disease)   . Diverticulosis of colon   . Colonic polyp   . Alcohol abuse   . Urinary frequency   . Neck pain   . Lumbosacral spondylosis without myelopathy   . Fatigue   . Anxiety   . Chronic insomnia     Current Outpatient Prescriptions  Medication  Sig Dispense Refill  . apixaban (ELIQUIS) 5 MG TABS tablet Take 1 tablet (5 mg total) by mouth 2 (two) times daily. 60 tablet 11  . diltiazem (CARDIZEM CD) 180 MG 24 hr capsule TAKE ONE CAPSULE BY MOUTH EVERY DAY 30 capsule 3  . gabapentin (NEURONTIN) 300 MG capsule Take 1 capsule (300 mg total) by mouth 3 (three) times daily. 90 capsule 5  . ibuprofen (ADVIL,MOTRIN) 200 MG tablet Per bottle as needed    . LORazepam (ATIVAN) 1 MG tablet TAKE 1/2 TO 1 TABLET BY MOUTH 3 TIMES A DAY AS NEEDED 90 tablet 5  . methocarbamol (ROBAXIN) 500 MG tablet Take 1 tablet (500 mg total) by mouth 3 (three) times daily as needed for muscle spasms. 90 tablet 0  . tamsulosin (FLOMAX) 0.4 MG CAPS capsule TAKE 1 CAPSULE BY MOUTH DAILY. 30 capsule 5  . traMADol (ULTRAM) 50 MG tablet Take 1 tablet (50 mg total) by mouth 3 (three) times daily as needed for moderate pain. for pain 90 tablet 0  . albuterol (PROVENTIL HFA;VENTOLIN HFA) 108 (90 BASE) MCG/ACT inhaler Inhale 2 puffs into the lungs every 6 (six) hours as needed for wheezing or shortness of breath. (Patient not taking: Reported on 07/20/2014) 1 Inhaler 2   No current facility-administered medications for this visit.  No Known Allergies  Family History  Problem Relation Age of Onset  . Prostate cancer Father   . Hypertension Sister   . Hypertension Mother     History   Social History  . Marital Status: Married    Spouse Name: N/A  . Number of Children: 2  . Years of Education: N/A   Occupational History  . Reitred from [employer]   .     Social History Main Topics  . Smoking status: Current Every Day Smoker -- 1.50 packs/day for 54 years    Types: Cigarettes  . Smokeless tobacco: Former Systems developer    Types: Chew     Comment:  and still smokes sometimes but not everyday  . Alcohol Use: 0.0 oz/week    0 Standard drinks or equivalent per week     Comment: occasional  . Drug Use: No  . Sexual Activity: Not on file   Other Topics Concern  . Not on  file   Social History Narrative   Lives with wife.      ROS:  Constitutional: Pt reports fatigue. Denies fever, malaise, headache or abrupt weight changes.  HEENT: Denies eye pain, eye redness, ear pain, ringing in the ears, wax buildup, runny nose, nasal congestion, bloody nose, or sore throat. Respiratory: Denies difficulty breathing, shortness of breath, cough or sputum production. Cardiovascular: Denies chest pain, chest tightness, palpitations or swelling in the hands or feet.  Gastrointestinal: Pt reports bloating. Denies abdominal pain, constipation, diarrhea or blood in the stool.  GU: Pt reports urinary frequency. Denies urgency, pain with urination, blood in urine, odor or discharge. Musculoskeletal: Pt reports back pain. Denies decrease in range of motion, difficulty with gait, muscle pain or joint swelling.  Skin: Denies redness, rashes, lesions or ulcercations.  Neurological: Denies dizziness, difficulty with memory, difficulty with speech or problems with balance and coordination.  Psych: Denies anxiety, depression, SI/HI.  No other specific complaints in a complete review of systems (except as listed in HPI above).  PE:  BP 116/76 mmHg  Pulse 82  Temp(Src) 97.9 F (36.6 C) (Oral)  Wt 166 lb (75.297 kg)  SpO2 95%  Wt Readings from Last 3 Encounters:  07/20/14 166 lb (75.297 kg)  03/08/14 172 lb (78.019 kg)  01/24/14 172 lb 2 oz (78.075 kg)    General: Appears his stated age, chronically ill appearing in NAD. Neck: Neck supple, trachea midline. No masses, lumps or thyromegaly present.  Cardiovascular: Normal rate and rhythm. S1,S2 noted.  No murmur, rubs or gallops noted. No JVD or BLE edema. No carotid bruits noted. Pulmonary/Chest: Normal effort and positive vesicular breath sounds. No respiratory distress. No wheezes, rales or ronchi noted.  Abdomen: Soft and nontender. Normal bowel sounds, no bruits noted. No distention or masses noted. Liver, spleen and  kidneys non palpable. Musculoskeletal: Decreased flexion of the back. Normal extension and rotation. Strength 4/5 BLE. Neurological: Alert and oriented. Sensation intact to BLE. Psychiatric: Mood and affect normal. Behavior is normal. Judgment and thought content normal.     BMET    Component Value Date/Time   NA 139 08/23/2013 1616   K 3.8 08/23/2013 1616   CL 106 08/23/2013 1616   CO2 28 08/23/2013 1616   GLUCOSE 83 08/23/2013 1616   BUN 7 08/23/2013 1616   CREATININE 1.0 08/23/2013 1616   CALCIUM 9.2 08/23/2013 1616   GFRNONAA 128.24 08/17/2009 1150   GFRAA  07/11/2009 0415    >60        The  eGFR has been calculated using the MDRD equation. This calculation has not been validated in all clinical situations. eGFR's persistently <60 mL/min signify possible Chronic Kidney Disease.    Lipid Panel     Component Value Date/Time   CHOL  06/28/2009 0650    175        ATP III CLASSIFICATION:  <200     mg/dL   Desirable  200-239  mg/dL   Borderline High  >=240    mg/dL   High          TRIG 491* 06/28/2009 0650   HDL 28* 06/28/2009 0650   CHOLHDL 6.3 06/28/2009 0650   VLDL UNABLE TO CALCULATE IF TRIGLYCERIDE OVER 400 mg/dL 06/28/2009 0650   LDLCALC  06/28/2009 0650    UNABLE TO CALCULATE IF TRIGLYCERIDE OVER 400 mg/dL        Total Cholesterol/HDL:CHD Risk Coronary Heart Disease Risk Table                     Men   Women  1/2 Average Risk   3.4   3.3  Average Risk       5.0   4.4  2 X Average Risk   9.6   7.1  3 X Average Risk  23.4   11.0        Use the calculated Patient Ratio above and the CHD Risk Table to determine the patient's CHD Risk.        ATP III CLASSIFICATION (LDL):  <100     mg/dL   Optimal  100-129  mg/dL   Near or Above                    Optimal  130-159  mg/dL   Borderline  160-189  mg/dL   High  >190     mg/dL   Very High    CBC    Component Value Date/Time   WBC 4.9 08/23/2013 1616   RBC 3.98* 08/23/2013 1616   HGB 14.1 08/23/2013  1616   HCT 42.2 08/23/2013 1616   PLT 300.0 08/23/2013 1616   MCV 106.0* 08/23/2013 1616   MCHC 33.3 08/23/2013 1616   RDW 14.6 08/23/2013 1616   LYMPHSABS 1.6 07/19/2013 1621   MONOABS 0.2 07/19/2013 1621   EOSABS 0.0 07/19/2013 1621   BASOSABS 0.0 07/19/2013 1621    Hgb A1C Lab Results  Component Value Date   HGBA1C * 06/28/2009    5.9 (NOTE)                                                                       According to the ADA Clinical Practice Recommendations for 2011, when HbA1c is used as a screening test:   >=6.5%   Diagnostic of Diabetes Mellitus           (if abnormal result  is confirmed)  5.7-6.4%   Increased risk of developing Diabetes Mellitus  References:Diagnosis and Classification of Diabetes Mellitus,Diabetes MVEH,2094,70(JGGEZ 1):S62-S69 and Standards of Medical Care in         Diabetes - 2011,Diabetes MOQH,4765,46  (Suppl 1):S11-S61.     Assessment and Plan:

## 2014-07-21 ENCOUNTER — Ambulatory Visit: Payer: Medicare Other | Admitting: Pulmonary Disease

## 2014-07-29 ENCOUNTER — Other Ambulatory Visit: Payer: Self-pay

## 2014-07-29 ENCOUNTER — Other Ambulatory Visit: Payer: Self-pay | Admitting: Pulmonary Disease

## 2014-07-29 MED ORDER — DILTIAZEM HCL ER COATED BEADS 180 MG PO CP24: 180.0000 mg | ORAL_CAPSULE | Freq: Every day | ORAL | Status: DC

## 2014-07-29 NOTE — Telephone Encounter (Signed)
Marlowe Kays pts daughter left v/m requesting refill diltiazem to [facility]. Advised Connie refill done per protocol.

## 2014-07-31 ENCOUNTER — Other Ambulatory Visit: Payer: Self-pay | Admitting: Pulmonary Disease

## 2014-08-20 ENCOUNTER — Other Ambulatory Visit: Payer: Self-pay | Admitting: Cardiology

## 2014-08-22 ENCOUNTER — Other Ambulatory Visit: Payer: Self-pay | Admitting: Pulmonary Disease

## 2014-08-22 ENCOUNTER — Other Ambulatory Visit: Payer: Self-pay | Admitting: Internal Medicine

## 2014-08-22 NOTE — Telephone Encounter (Signed)
CARDIZEM last refilled 07/29/14 with 5 refills so that's denied Gabapentin last filled by Dr Dorisann Frames advise if okay to refill

## 2014-08-23 NOTE — Telephone Encounter (Signed)
Gabapentin sent electronically

## 2014-09-12 ENCOUNTER — Encounter: Payer: Self-pay | Admitting: Podiatry

## 2014-09-12 ENCOUNTER — Ambulatory Visit (INDEPENDENT_AMBULATORY_CARE_PROVIDER_SITE_OTHER): Payer: Medicare Other | Admitting: Podiatry

## 2014-09-12 VITALS — BP 100/62 | HR 85 | Resp 18

## 2014-09-12 DIAGNOSIS — B070 Plantar wart: Secondary | ICD-10-CM | POA: Diagnosis not present

## 2014-09-12 DIAGNOSIS — M79672 Pain in left foot: Secondary | ICD-10-CM | POA: Diagnosis not present

## 2014-09-12 DIAGNOSIS — Q828 Other specified congenital malformations of skin: Secondary | ICD-10-CM | POA: Diagnosis not present

## 2014-09-12 NOTE — Progress Notes (Signed)
Patient ID: LIONARDO HAZE, male   DOB: 1940/04/17, 74 y.o.   MRN: [id number]  Subjective: 74 year old male presents the office with complaints of a painful lesion to the bottom of his left heel. He states that he previously was seen for the same issue last year and had relief of symptoms the last couple months when he starts to have recurrence. Denies any redness or drainage. His motor was pain with weightbearing or pressure to the area. No other complaints at this time.  Objective: AAO 3, NAD DP/PT pulses palpable, CRT less than 3 seconds Protective sensation intact with Simms Weinstein monofilament To the plantar aspect of the left heel there is an annular hyperkeratotic lesion. Upon debridement lesion no underlying ulceration, drainage or other signs of infection. There is no pinpoint bleeding or evidence of verruca. No other open lesions or pre-ulcer lesions identified bilaterally. There is no other areas of tenderness to bilateral lower extremities. No pain with calf compression, swelling, warmth, erythema.  Assessment: 74 year old male left plantar heel porokeratosis  Plan: Lesion sharply debrided without consultation/bleeding. After debridement the area was cleaned. A pad was placed around the area followed by salinocaine and an occlusive bandage. Post procedure instructions were discussed the patient.Monitor for any clinical signs or symptoms of infection and directed to call the office immediately should any occur or go to the ER. Follow-up as needed. Call the office with any questions, concerns, changes symptoms.  Celesta Gentile, DPM

## 2014-09-17 ENCOUNTER — Other Ambulatory Visit: Payer: Self-pay | Admitting: Pulmonary Disease

## 2014-10-04 ENCOUNTER — Other Ambulatory Visit: Payer: Self-pay | Admitting: Pulmonary Disease

## 2014-10-11 ENCOUNTER — Other Ambulatory Visit: Payer: Self-pay

## 2014-10-11 MED ORDER — TAMSULOSIN HCL 0.4 MG PO CAPS: 0.4000 mg | ORAL_CAPSULE | Freq: Every day | ORAL | Status: DC

## 2014-10-11 NOTE — Telephone Encounter (Signed)
Connie(DPR signed) left v/m requesting refill tamsulosin to [facility];per protocol and office note on 07/20/14 refill done and [place] voiced understanding.

## 2014-10-24 ENCOUNTER — Other Ambulatory Visit: Payer: Self-pay | Admitting: Pulmonary Disease

## 2014-11-11 ENCOUNTER — Other Ambulatory Visit: Payer: Self-pay

## 2014-11-11 MED ORDER — GABAPENTIN 300 MG PO CAPS: 300.0000 mg | ORAL_CAPSULE | Freq: Three times a day (TID) | ORAL | Status: DC

## 2014-11-11 NOTE — Telephone Encounter (Signed)
Kurt [name] (DPR signed)left v/m requesting refill gabapentin to [facility]; rx last refilled #90 x 1 on 08/23/14. Pt is out of med. Pt established care and last seen on 07/20/14; 01/23/15 future appt scheduled.

## 2014-12-12 ENCOUNTER — Other Ambulatory Visit: Payer: Self-pay | Admitting: Internal Medicine

## 2014-12-19 ENCOUNTER — Other Ambulatory Visit: Payer: Self-pay | Admitting: Pulmonary Disease

## 2014-12-21 ENCOUNTER — Telehealth: Payer: Self-pay

## 2014-12-21 NOTE — Telephone Encounter (Signed)
Pt left v/m; pt's heart med is $165.00 and pt wants less expensive med. Advised pt needs to contact ins co and ask what med is approved that could be substituted for heart med; pt voiced understanding.

## 2014-12-26 ENCOUNTER — Telehealth: Payer: Self-pay

## 2014-12-26 NOTE — Telephone Encounter (Signed)
pts daughter DPR signed request refill diltiazem; spoke with [facility] and refill on hold and will get readyf or pick up. pts daughter will ck with pharmacy.

## 2015-01-16 ENCOUNTER — Other Ambulatory Visit: Payer: Self-pay | Admitting: Cardiology

## 2015-01-17 ENCOUNTER — Other Ambulatory Visit: Payer: Self-pay | Admitting: Internal Medicine

## 2015-01-23 ENCOUNTER — Ambulatory Visit (INDEPENDENT_AMBULATORY_CARE_PROVIDER_SITE_OTHER): Payer: Medicare Other | Admitting: Internal Medicine

## 2015-01-23 ENCOUNTER — Encounter: Payer: Self-pay | Admitting: Internal Medicine

## 2015-01-23 VITALS — BP 130/66 | HR 76 | Temp 97.6°F | Wt 168.0 lb

## 2015-01-23 DIAGNOSIS — R5383 Other fatigue: Secondary | ICD-10-CM | POA: Diagnosis not present

## 2015-01-23 DIAGNOSIS — Z23 Encounter for immunization: Secondary | ICD-10-CM | POA: Diagnosis not present

## 2015-01-23 DIAGNOSIS — K219 Gastro-esophageal reflux disease without esophagitis: Secondary | ICD-10-CM

## 2015-01-23 DIAGNOSIS — I480 Paroxysmal atrial fibrillation: Secondary | ICD-10-CM

## 2015-01-23 DIAGNOSIS — F411 Generalized anxiety disorder: Secondary | ICD-10-CM

## 2015-01-23 DIAGNOSIS — J449 Chronic obstructive pulmonary disease, unspecified: Secondary | ICD-10-CM

## 2015-01-23 DIAGNOSIS — M47817 Spondylosis without myelopathy or radiculopathy, lumbosacral region: Secondary | ICD-10-CM

## 2015-01-23 DIAGNOSIS — E785 Hyperlipidemia, unspecified: Secondary | ICD-10-CM | POA: Diagnosis not present

## 2015-01-23 DIAGNOSIS — G4700 Insomnia, unspecified: Secondary | ICD-10-CM

## 2015-01-23 DIAGNOSIS — N400 Enlarged prostate without lower urinary tract symptoms: Secondary | ICD-10-CM

## 2015-01-23 DIAGNOSIS — I82401 Acute embolism and thrombosis of unspecified deep veins of right lower extremity: Secondary | ICD-10-CM | POA: Diagnosis not present

## 2015-01-23 MED ORDER — FINASTERIDE 5 MG PO TABS: 5.0000 mg | ORAL_TABLET | Freq: Every day | ORAL | Status: DC

## 2015-01-23 NOTE — Patient Instructions (Signed)
Fatigue  Fatigue is feeling tired all of the time, a lack of energy, or a lack of motivation. Occasional or mild fatigue is often a normal response to activity or life in general. However, long-lasting (chronic) or extreme fatigue may indicate an underlying medical condition.  HOME CARE INSTRUCTIONS   Watch your fatigue for any changes. The following actions may help to lessen any discomfort you are feeling:  · Talk to your health care provider about how much sleep you need each night. Try to get the required amount every night.  · Take medicines only as directed by your health care provider.  · Eat a healthy and nutritious diet. Ask your health care provider if you need help changing your diet.  · Drink enough fluid to keep your urine clear or pale yellow.  · Practice ways of relaxing, such as yoga, meditation, massage therapy, or acupuncture.  · Exercise regularly.    · Change situations that cause you stress. Try to keep your work and personal routine reasonable.  · Do not abuse illegal drugs.  · Limit alcohol intake to no more than 1 drink per day for nonpregnant women and 2 drinks per day for men. One drink equals 12 ounces of beer, 5 ounces of wine, or 1½ ounces of hard liquor.  · Take a multivitamin, if directed by your health care provider.  SEEK MEDICAL CARE IF:   · Your fatigue does not get better.  · You have a fever.    · You have unintentional weight loss or gain.  · You have headaches.    · You have difficulty:      Falling asleep.    Sleeping throughout the night.  · You feel angry, guilty, anxious, or sad.     · You are unable to have a bowel movement (constipation).    · You skin is dry.     · Your legs or another part of your body is swollen.    SEEK IMMEDIATE MEDICAL CARE IF:   · You feel confused.    · Your vision is blurry.  · You feel faint or pass out.    · You have a severe headache.    · You have severe abdominal, pelvic, or back pain.    · You have chest pain, shortness of breath, or an  irregular or fast heartbeat.    · You are unable to urinate or you urinate less than normal.    · You develop abnormal bleeding, such as bleeding from the rectum, vagina, nose, lungs, or nipples.  · You vomit blood.     · You have thoughts about harming yourself or committing suicide.    · You are worried that you might harm someone else.       This information is not intended to replace advice given to you by your health care provider. Make sure you discuss any questions you have with your health care provider.     Document Released: 11/18/2006 Document Revised: 02/11/2014 Document Reviewed: 05/25/2013  Elsevier Interactive Patient Education ©2016 Elsevier Inc[employer]

## 2015-01-23 NOTE — Assessment & Plan Note (Signed)
Will add Proscar to Flomax to see if it helps

## 2015-01-23 NOTE — Assessment & Plan Note (Signed)
He is not interested in treatment at this time

## 2015-01-23 NOTE — Assessment & Plan Note (Signed)
Encouraged him to continue Cardizem We will try to find a cheaper alternative for Eliquis

## 2015-01-23 NOTE — Assessment & Plan Note (Signed)
Stable He does not take Ativan

## 2015-01-23 NOTE — Assessment & Plan Note (Signed)
We will call his insurance company to see if there is a covered alternative that is cheaper than Eliquis

## 2015-01-23 NOTE — Assessment & Plan Note (Signed)
  Continue Gabapentin, Robaxin, and Tramadol He declines referral for pain management at this time

## 2015-01-23 NOTE — Assessment & Plan Note (Signed)
Advised him to try to avoid foods that trigger his reflux

## 2015-01-23 NOTE — Progress Notes (Signed)
Pre visit review using our clinic review tool, if applicable. No additional management support is needed unless otherwise documented below in the visit note. 

## 2015-01-23 NOTE — Progress Notes (Signed)
HPI  Pt presents to the clinic today for 6 month follow up of chronic conditions   COPD: He has Albuterol but does not use it. He does continue to smoke 1.5 ppd.  Afib: Paroxysmal. He takes Cardizem and Eliquis daily. He does not think he needs these medications and he no longer wants to see cardiology. 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 He also reports the Eliquis is too expensive but he reports he does take it. He did have a DVT 4 years ago.  HLD: He does not know the last time his cholesterol was checked. There are no results to review in Epic. He does try to consume a low fat diet.   GERD: This is occasional. He does have some associated bloating. He reports that it is not bad enough to take anything for it.   PVD: He denies any history of this.  Insomnia: He reports he only sleeps 3-4 hours per night. He has trouble falling asleep. He can not turn his mind off he has tried OTC medications but they did not seem to help so he stopped taking them.  BPH: He reports he does have continual dripping. He has seen urology and they put him on Flomax, but it did not help.  Anxiety: He reports he is not anxious. He does not take the Ativan.  Chronic low back pain: This is severe. The pain starts in his lower back and radiates down his leg. He is taking Gabapentin but not sure if it helps. He takes Tramadol and Robaxin as needed.  He would like to get his flu vaccine today if he could.  Past Medical History  Diagnosis Date  . COPD (chronic obstructive pulmonary disease) (HCC)   . Cigarette smoker   . Abnormal chest x-ray   . History of pneumonia   . Atrial fibrillation (HCC)   . DVT (deep venous thrombosis) (HCC)   . Hypercholesterolemia   . GERD (gastroesophageal reflux disease)   . Diverticulosis of colon   . Colonic polyp   . Alcohol abuse   . Urinary frequency   . Neck pain   . Lumbosacral spondylosis without myelopathy   . Fatigue   . Anxiety   . Chronic insomnia     Current  Outpatient Prescriptions  Medication Sig Dispense Refill  . albuterol (PROVENTIL HFA;VENTOLIN HFA) 108 (90 BASE) MCG/ACT inhaler Inhale 2 puffs into the lungs every 6 (six) hours as needed for wheezing or shortness of breath. 1 Inhaler 2  . diltiazem (CARDIZEM CD) 180 MG 24 hr capsule TAKE ONE CAPSULE BY MOUTH EVERY DAY 30 capsule 2  . ELIQUIS 5 MG TABS tablet TAKE ONE TABLET BY MOUTH TWICE DAILY 60 tablet 4  . gabapentin (NEURONTIN) 300 MG capsule TAKE 1 CAPSULE THREE TIMES A DAY 90 capsule 0  . ibuprofen (ADVIL,MOTRIN) 200 MG tablet Per bottle as needed    . LORazepam (ATIVAN) 1 MG tablet TAKE 1/2 TO 1 TABLET BY MOUTH 3 TIMES A DAY AS NEEDED 90 tablet 5  . methocarbamol (ROBAXIN) 500 MG tablet Take 1 tablet (500 mg total) by mouth 3 (three) times daily as needed for muscle spasms. 90 tablet 0  . tamsulosin (FLOMAX) 0.4 MG CAPS capsule TAKE 1 CAPSULE (0.4 MG TOTAL) BY MOUTH DAILY. 30 capsule 0  . traMADol (ULTRAM) 50 MG tablet Take 1 tablet (50 mg total) by mouth 3 (three) times daily as needed for moderate pain. for pain 90 tablet 0   No current facility-administered  medications for this visit.    No Known Allergies  Family History  Problem Relation Age of Onset  . Prostate cancer Father   . Cancer Father     prostate  . Hypertension Sister   . Hypertension Mother     Social History   Social History  . Marital Status: Married    Spouse Name: N/A  . Number of Children: 2  . Years of Education: N/A   Occupational History  . Reitred from [employer]   .     Social History Main Topics  . Smoking status: Current Every Day Smoker -- 1.50 packs/day for 54 years    Types: Cigarettes  . Smokeless tobacco: Former Systems developer    Types: Chew     Comment:  and still smokes sometimes but not everyday  . Alcohol Use: 1.2 oz/week    0 Standard drinks or equivalent, 2 Cans of beer per week     Comment: occasional  . Drug Use: No  . Sexual Activity: Not Currently   Other Topics Concern  .  Not on file   Social History Narrative   Lives with wife.      ROS:  Constitutional: Pt reports fatigue. Denies fever, malaise, headache or abrupt weight changes.  HEENT: Pt reports blurred vision. Denies eye pain, eye redness, ear pain, ringing in the ears, wax buildup, runny nose, nasal congestion, bloody nose, or sore throat. Respiratory: Denies difficulty breathing, shortness of breath, cough or sputum production. Cardiovascular: Denies chest pain, chest tightness, palpitations or swelling in the hands or feet.  Gastrointestinal: Pt reports bloating. Denies abdominal pain, constipation, diarrhea or blood in the stool.  GU: Pt reports urinary frequency. Denies urgency, pain with urination, blood in urine, odor or discharge. Musculoskeletal: Pt reports back pain. Denies decrease in range of motion, difficulty with gait, muscle pain or joint swelling.  Skin: Denies redness, rashes, lesions or ulcercations.  Neurological: Denies dizziness, difficulty with memory, difficulty with speech or problems with balance and coordination.  Psych: Denies anxiety, depression, SI/HI.  No other specific complaints in a complete review of systems (except as listed in HPI above).  PE:  BP 130/66 mmHg  Pulse 76  Temp(Src) 97.6 F (36.4 C) (Oral)  Wt 168 lb (76.204 kg)   Wt Readings from Last 3 Encounters:  01/23/15 168 lb (76.204 kg)  07/20/14 166 lb (75.297 kg)  03/08/14 172 lb (78.019 kg)    General: Appears his stated age, chronically ill appearing in NAD. Neck: Neck supple, trachea midline. No masses, lumps or thyromegaly present.  Cardiovascular: Normal rate and rhythm. S1,S2 noted.  No murmur, rubs or gallops noted. No JVD or BLE edema. No carotid bruits noted. Pulmonary/Chest: Normal effort and positive vesicular breath sounds. No respiratory distress. No wheezes, rales or ronchi noted.  Abdomen: Soft and nontender. Normal bowel sounds. No distention or masses noted. Liver, spleen and  kidneys non palpable. Musculoskeletal: Decreased flexion of the back. Normal extension and rotation. Strength 4/5 BLE. Neurological: Alert and oriented. Sensation intact to BLE. Psychiatric: Mood and affect normal. Behavior is normal. Judgment and thought content normal.     BMET    Component Value Date/Time   NA 139 08/23/2013 1616   K 3.8 08/23/2013 1616   CL 106 08/23/2013 1616   CO2 28 08/23/2013 1616   GLUCOSE 83 08/23/2013 1616   BUN 7 08/23/2013 1616   CREATININE 1.0 08/23/2013 1616   CALCIUM 9.2 08/23/2013 1616   GFRNONAA 128.24 08/17/2009 1150  GFRAA  07/11/2009 0415    >60        The eGFR has been calculated using the MDRD equation. This calculation has not been validated in all clinical situations. eGFR's persistently <60 mL/min signify possible Chronic Kidney Disease.    Lipid Panel     Component Value Date/Time   CHOL  06/28/2009 0650    175        ATP III CLASSIFICATION:  <200     mg/dL   Desirable  200-239  mg/dL   Borderline High  >=240    mg/dL   High          TRIG 491* 06/28/2009 0650   HDL 28* 06/28/2009 0650   CHOLHDL 6.3 06/28/2009 0650   VLDL UNABLE TO CALCULATE IF TRIGLYCERIDE OVER 400 mg/dL 06/28/2009 0650   LDLCALC  06/28/2009 0650    UNABLE TO CALCULATE IF TRIGLYCERIDE OVER 400 mg/dL        Total Cholesterol/HDL:CHD Risk Coronary Heart Disease Risk Table                     Men   Women  1/2 Average Risk   3.4   3.3  Average Risk       5.0   4.4  2 X Average Risk   9.6   7.1  3 X Average Risk  23.4   11.0        Use the calculated Patient Ratio above and the CHD Risk Table to determine the patient's CHD Risk.        ATP III CLASSIFICATION (LDL):  <100     mg/dL   Optimal  100-129  mg/dL   Near or Above                    Optimal  130-159  mg/dL   Borderline  160-189  mg/dL   High  >190     mg/dL   Very High    CBC    Component Value Date/Time   WBC 4.9 08/23/2013 1616   RBC 3.98* 08/23/2013 1616   HGB 14.1 08/23/2013  1616   HCT 42.2 08/23/2013 1616   PLT 300.0 08/23/2013 1616   MCV 106.0* 08/23/2013 1616   MCHC 33.3 08/23/2013 1616   RDW 14.6 08/23/2013 1616   LYMPHSABS 1.6 07/19/2013 1621   MONOABS 0.2 07/19/2013 1621   EOSABS 0.0 07/19/2013 1621   BASOSABS 0.0 07/19/2013 1621    Hgb A1C Lab Results  Component Value Date   HGBA1C * 06/28/2009    5.9 (NOTE)                                                                       According to the ADA Clinical Practice Recommendations for 2011, when HbA1c is used as a screening test:   >=6.5%   Diagnostic of Diabetes Mellitus           (if abnormal result  is confirmed)  5.7-6.4%   Increased risk of developing Diabetes Mellitus  References:Diagnosis and Classification of Diabetes Mellitus,Diabetes YQIH,4742,59(DGLOV 1):S62-S69 and Standards of Medical Care in         Diabetes - 2011,Diabetes FIEP,3295,18  (Suppl 1):S11-S61.     Assessment  and Plan:  Fatigue:  Will check B12, Vit D and TSH today  Make an appt for your annual exam

## 2015-01-23 NOTE — Assessment & Plan Note (Signed)
Will check Lipid Profile and CEMT today Encouraged him to consume a low fat diet

## 2015-01-23 NOTE — Assessment & Plan Note (Signed)
Discussed smoking cessation but he declines

## 2015-01-24 LAB — CBC
HCT: 46.4 % (ref 39.0–52.0)
HEMOGLOBIN: 15.5 g/dL (ref 13.0–17.0)
MCHC: 33.3 g/dL (ref 30.0–36.0)
MCV: 101.7 fl — ABNORMAL HIGH (ref 78.0–100.0)
Platelets: 313 10*3/uL (ref 150.0–400.0)
RBC: 4.56 Mil/uL (ref 4.22–5.81)
RDW: 14.5 % (ref 11.5–15.5)
WBC: 4.3 10*3/uL (ref 4.0–10.5)

## 2015-01-24 LAB — VITAMIN B12: VITAMIN B 12: 198 pg/mL — AB (ref 211–911)

## 2015-01-24 LAB — COMPREHENSIVE METABOLIC PANEL
ALBUMIN: 3.8 g/dL (ref 3.5–5.2)
ALK PHOS: 79 U/L (ref 39–117)
ALT: 12 U/L (ref 0–53)
AST: 20 U/L (ref 0–37)
BILIRUBIN TOTAL: 0.4 mg/dL (ref 0.2–1.2)
BUN: 8 mg/dL (ref 6–23)
CO2: 26 mEq/L (ref 19–32)
Calcium: 9.6 mg/dL (ref 8.4–10.5)
Chloride: 104 mEq/L (ref 96–112)
Creatinine, Ser: 1 mg/dL (ref 0.40–1.50)
GFR: 93.43 mL/min (ref >60.00)
GLUCOSE: 69 mg/dL — AB (ref 70–99)
Potassium: 4 mEq/L (ref 3.5–5.1)
SODIUM: 137 meq/L (ref 135–145)
TOTAL PROTEIN: 7.4 g/dL (ref 6.0–8.3)

## 2015-01-24 LAB — LIPID PANEL
Cholesterol: 211 mg/dL — ABNORMAL HIGH (ref 0–200)
HDL: 58 mg/dL (ref >39.00)
LDL Cholesterol: 132 mg/dL — ABNORMAL HIGH (ref 0–99)
NonHDL: 152.6
Total CHOL/HDL Ratio: 4
Triglycerides: 102 mg/dL (ref 0.0–149.0)
VLDL: 20.4 mg/dL (ref 0.0–40.0)

## 2015-01-24 LAB — VITAMIN D 25 HYDROXY (VIT D DEFICIENCY, FRACTURES): VITD: 34.24 ng/mL (ref 30.00–100.00)

## 2015-01-24 LAB — TSH: TSH: 2.31 u[IU]/mL (ref 0.35–4.50)

## 2015-01-24 NOTE — Addendum Note (Signed)
Addended by: Lurlean Nanny on: 01/24/2015 10:30 AM   Modules accepted: Orders

## 2015-01-26 ENCOUNTER — Telehealth: Payer: Self-pay | Admitting: Internal Medicine

## 2015-01-26 NOTE — Telephone Encounter (Signed)
DAUGHTER Marlowe Kays called re: labs results. Gave her results as provider stated.  She has questions about his sugar levels or is he on the verge of being diabetic.  Best number to call is [phone] or [phone] to speak to daughter.

## 2015-01-27 NOTE — Telephone Encounter (Signed)
Pt's daughter Marlowe Kays called stating she missed a call from [facility]. You can reach her at [phone].

## 2015-02-02 NOTE — Telephone Encounter (Signed)
Lab letter mailed. 

## 2015-02-15 ENCOUNTER — Other Ambulatory Visit: Payer: Self-pay | Admitting: Internal Medicine

## 2015-02-16 NOTE — Telephone Encounter (Signed)
Should be filled by cards

## 2015-02-16 NOTE — Telephone Encounter (Signed)
Pt being monitored by Cardiology--please advise

## 2015-02-16 NOTE — Addendum Note (Signed)
Addended by: Lurlean Nanny on: 02/16/2015 03:39 PM   Modules accepted: Orders

## 2015-02-16 NOTE — Telephone Encounter (Signed)
I can feel this one time for 3 months but he needs to schedule follow up.

## 2015-02-16 NOTE — Telephone Encounter (Signed)
Last filled 10/2014 with 2 refills by cardiology---please advise if you will refill or will need to forward to cardiology

## 2015-02-17 MED ORDER — DILTIAZEM HCL ER COATED BEADS 180 MG PO CP24: 180.0000 mg | ORAL_CAPSULE | Freq: Every day | ORAL | Status: DC

## 2015-02-17 NOTE — Addendum Note (Signed)
Addended by: Lurlean Nanny on: 02/17/2015 08:38 AM   Modules accepted: Orders

## 2015-02-18 ENCOUNTER — Other Ambulatory Visit: Payer: Self-pay | Admitting: Internal Medicine

## 2015-03-07 ENCOUNTER — Other Ambulatory Visit: Payer: Self-pay | Admitting: Internal Medicine

## 2015-04-05 ENCOUNTER — Other Ambulatory Visit: Payer: Self-pay | Admitting: Internal Medicine

## 2015-04-10 ENCOUNTER — Other Ambulatory Visit: Payer: Self-pay | Admitting: Internal Medicine

## 2015-05-07 ENCOUNTER — Other Ambulatory Visit: Payer: Self-pay | Admitting: Cardiology

## 2015-05-08 ENCOUNTER — Encounter: Payer: Self-pay | Admitting: Gastroenterology

## 2015-05-08 NOTE — Telephone Encounter (Signed)
Rx(s) sent to pharmacy electronically.  

## 2015-05-10 DIAGNOSIS — H2513 Age-related nuclear cataract, bilateral: Secondary | ICD-10-CM | POA: Diagnosis not present

## 2015-05-10 DIAGNOSIS — Z0100 Encounter for examination of eyes and vision without abnormal findings: Secondary | ICD-10-CM | POA: Diagnosis not present

## 2015-06-04 ENCOUNTER — Other Ambulatory Visit: Payer: Self-pay | Admitting: Internal Medicine

## 2015-06-04 ENCOUNTER — Other Ambulatory Visit: Payer: Self-pay | Admitting: Cardiology

## 2015-06-05 ENCOUNTER — Other Ambulatory Visit: Payer: Self-pay | Admitting: Cardiology

## 2015-06-24 ENCOUNTER — Other Ambulatory Visit: Payer: Self-pay | Admitting: Cardiology

## 2015-06-26 NOTE — Telephone Encounter (Signed)
Rx(s) sent to pharmacy electronically.  

## 2015-07-02 ENCOUNTER — Other Ambulatory Visit: Payer: Self-pay | Admitting: Internal Medicine

## 2015-07-09 ENCOUNTER — Other Ambulatory Visit: Payer: Self-pay | Admitting: Internal Medicine

## 2015-07-10 ENCOUNTER — Other Ambulatory Visit: Payer: Self-pay

## 2015-07-10 MED ORDER — DILTIAZEM HCL ER COATED BEADS 180 MG PO CP24: 180.0000 mg | ORAL_CAPSULE | Freq: Every day | ORAL | Status: DC

## 2015-07-10 NOTE — Telephone Encounter (Signed)
Kurt [name] DPR signed request refill diltiazem to [facility]. # 30 done. Pt had med wellness on 07/24/15 and will get refills updated at that time.

## 2015-07-24 ENCOUNTER — Other Ambulatory Visit: Payer: Self-pay | Admitting: Internal Medicine

## 2015-07-24 ENCOUNTER — Encounter: Payer: Self-pay | Admitting: Internal Medicine

## 2015-07-24 ENCOUNTER — Ambulatory Visit (INDEPENDENT_AMBULATORY_CARE_PROVIDER_SITE_OTHER): Payer: Medicare Other | Admitting: Internal Medicine

## 2015-07-24 VITALS — BP 132/64 | HR 89 | Temp 98.7°F | Ht 68.0 in | Wt 165.0 lb

## 2015-07-24 DIAGNOSIS — M47817 Spondylosis without myelopathy or radiculopathy, lumbosacral region: Secondary | ICD-10-CM

## 2015-07-24 DIAGNOSIS — I739 Peripheral vascular disease, unspecified: Secondary | ICD-10-CM

## 2015-07-24 DIAGNOSIS — I480 Paroxysmal atrial fibrillation: Secondary | ICD-10-CM | POA: Diagnosis not present

## 2015-07-24 DIAGNOSIS — Z1159 Encounter for screening for other viral diseases: Secondary | ICD-10-CM | POA: Diagnosis not present

## 2015-07-24 DIAGNOSIS — Z23 Encounter for immunization: Secondary | ICD-10-CM

## 2015-07-24 DIAGNOSIS — N400 Enlarged prostate without lower urinary tract symptoms: Secondary | ICD-10-CM | POA: Diagnosis not present

## 2015-07-24 DIAGNOSIS — J449 Chronic obstructive pulmonary disease, unspecified: Secondary | ICD-10-CM

## 2015-07-24 DIAGNOSIS — Z114 Encounter for screening for human immunodeficiency virus [HIV]: Secondary | ICD-10-CM | POA: Diagnosis not present

## 2015-07-24 DIAGNOSIS — F411 Generalized anxiety disorder: Secondary | ICD-10-CM

## 2015-07-24 DIAGNOSIS — B372 Candidiasis of skin and nail: Secondary | ICD-10-CM

## 2015-07-24 DIAGNOSIS — K219 Gastro-esophageal reflux disease without esophagitis: Secondary | ICD-10-CM | POA: Diagnosis not present

## 2015-07-24 DIAGNOSIS — G4700 Insomnia, unspecified: Secondary | ICD-10-CM

## 2015-07-24 DIAGNOSIS — Z0000 Encounter for general adult medical examination without abnormal findings: Secondary | ICD-10-CM

## 2015-07-24 DIAGNOSIS — Z125 Encounter for screening for malignant neoplasm of prostate: Secondary | ICD-10-CM

## 2015-07-24 DIAGNOSIS — E7800 Pure hypercholesterolemia, unspecified: Secondary | ICD-10-CM

## 2015-07-24 LAB — HIV ANTIBODY (ROUTINE TESTING W REFLEX): HIV 1&2 Ab, 4th Generation: NONREACTIVE

## 2015-07-24 MED ORDER — KETOCONAZOLE 2 % EX CREA: 1.0000 "application " | TOPICAL_CREAM | Freq: Every day | CUTANEOUS | Status: DC

## 2015-07-24 NOTE — Assessment & Plan Note (Signed)
Circulation intact Will monitor

## 2015-07-24 NOTE — Progress Notes (Signed)
Pre visit review using our clinic review tool, if applicable. No additional management support is needed unless otherwise documented below in the visit note.mh  

## 2015-07-24 NOTE — Patient Instructions (Signed)

## 2015-07-24 NOTE — Assessment & Plan Note (Signed)
Currently not an issue Will monitor 

## 2015-07-24 NOTE — Assessment & Plan Note (Signed)
He will continue to follow with ortho spine Continue Robaxin, Gabapentin and Tramadol

## 2015-07-24 NOTE — Progress Notes (Signed)
HPI:  Pt presents to the clinic today for his Medicare Wellness Exam. He is also due for follow up of chronic conditions.  COPD: He has Albuterol but does not use it. He does continue to smoke 1.5 ppd.  Afib: Paroxysmal. He takes Cardizem and Eliquis daily. He does not think he needs these medications and he no longer wants to see cardiology. He also reports the Eliquis is too expensive but he reports he does take it. He did have a DVT 4 years ago.  HLD:  His last LDL was 132. He does try to consume a low fat diet.   GERD: This is occasional. He does have some associated bloating. He reports that it is not bad enough to take anything for it.   PVD: He denies any history of this.  Insomnia: He reports he only sleeps 3-4 hours per night. He has trouble falling asleep. He can not turn his mind off he has tried OTC medications but they did not seem to help so he stopped taking them.  BPH: He reports he does have continual dripping. He has seen urology and they put him on Flomax, but it did not help.  Anxiety: He reports he is not anxious. He does not take the Ativan.  Chronic low back pain: This is severe. The pain starts in his lower back and radiates down his leg. He is taking Gabapentin but not sure if it helps. He takes Tramadol and Robaxin as needed.  Past Medical History  Diagnosis Date  . COPD (chronic obstructive pulmonary disease) ([place])   . Cigarette smoker   . Abnormal chest x-ray   . History of pneumonia   . Atrial fibrillation ([place])   . DVT (deep venous thrombosis) ([place])   . Hypercholesterolemia   . GERD (gastroesophageal reflux disease)   . Diverticulosis of colon   . Colonic polyp   . Alcohol abuse   . Urinary frequency   . Neck pain   . Lumbosacral spondylosis without myelopathy   . Fatigue   . Anxiety   . Chronic insomnia     Current Outpatient Prescriptions  Medication Sig Dispense Refill  . albuterol (PROVENTIL HFA;VENTOLIN HFA) 108 (90 BASE) MCG/ACT inhaler  Inhale 2 puffs into the lungs every 6 (six) hours as needed for wheezing or shortness of breath. 1 Inhaler 2  . diltiazem (CARDIZEM CD) 180 MG 24 hr capsule Take 1 capsule (180 mg total) by mouth daily. 30 capsule 0  . ELIQUIS 5 MG TABS tablet TAKE ONE TABLET BY MOUTH TWICE DAILY 60 tablet 4  . finasteride (PROSCAR) 5 MG tablet TAKE 1 TABLET (5 MG TOTAL) BY MOUTH DAILY. 30 tablet 2  . gabapentin (NEURONTIN) 300 MG capsule TAKE 1 CAPSULE THREE TIMES A DAY 90 capsule 2  . ibuprofen (ADVIL,MOTRIN) 200 MG tablet Per bottle as needed    . LORazepam (ATIVAN) 1 MG tablet TAKE 1/2 TO 1 TABLET BY MOUTH 3 TIMES A DAY AS NEEDED 90 tablet 5  . methocarbamol (ROBAXIN) 500 MG tablet Take 1 tablet (500 mg total) by mouth 3 (three) times daily as needed for muscle spasms. 90 tablet 0  . tamsulosin (FLOMAX) 0.4 MG CAPS capsule TAKE 1 CAPSULE (0.4 MG TOTAL) BY MOUTH DAILY. 30 capsule 2  . traMADol (ULTRAM) 50 MG tablet Take 1 tablet (50 mg total) by mouth 3 (three) times daily as needed for moderate pain. for pain 90 tablet 0   No current facility-administered medications for this visit.  No Known Allergies  Family History  Problem Relation Age of Onset  . Prostate cancer Father   . Cancer Father     prostate  . Hypertension Sister   . Hypertension Mother     Social History   Social History  . Marital Status: Married    Spouse Name: N/A  . Number of Children: 2  . Years of Education: N/A   Occupational History  . Reitred from [employer]   .     Social History Main Topics  . Smoking status: Current Every Day Smoker -- 1.50 packs/day for 54 years    Types: Cigarettes  . Smokeless tobacco: Former Systems developer    Types: Chew     Comment:  and still smokes sometimes but not everyday  . Alcohol Use: 1.2 oz/week    2 Cans of beer, 0 Standard drinks or equivalent per week     Comment: occasional  . Drug Use: No  . Sexual Activity: Not Currently   Other Topics Concern  . Not on file   Social  History Narrative   Lives with wife.      Hospitiliaztions: None  Health Maintenance:    Flu: 01/2015  Tetanus: not sure  Pneumovax: 10/2012  Prevnar: not sure  Zostavax: not sure  PSA: 08/2011  Colon Screening: 03/2014 at [facility] Doctor: yearly  Dental Exam: as needed   Providers:   PCP: Webb Silversmith, NP-C  Cardiologist: Dr. Percival Spanish  Orthopedist: Dr. Patrice Paradise  Podiatrist: Dr. Valentina Lucks  Optometrist: Dr. Janyth Contes   I have personally reviewed and have noted:  1. The patient's medical and social history 2. Their use of alcohol, tobacco or illicit drugs 3. Their current medications and supplements 4. The patient's functional ability including ADL's, fall risks, home safety  risks and hearing or visual impairment. 5. Diet and physical activities 6. Evidence for depression or mood disorder  Subjective:   Review of Systems:   Constitutional: Pt reports fatigue. Denies fever, malaise, headache or abrupt weight changes.  HEENT: Denies eye pain, eye redness, ear pain, ringing in the ears, wax buildup, runny nose, nasal congestion, bloody nose, or sore throat. Respiratory: Denies difficulty breathing, shortness of breath, cough or sputum production.   Cardiovascular: Denies chest pain, chest tightness, palpitations or swelling in the hands or feet.  Gastrointestinal: Denies abdominal pain, bloating, constipation, diarrhea or blood in the stool.  GU: Pt reports frequency. Denies urgency, pain with urination, burning sensation, blood in urine, odor or discharge. Musculoskeletal: Pt reports chronic back pain. Denies decrease in range of motion, difficulty with gait, muscle pain or joint swelling.  Skin: Pt reports rash at top of buttock. Denies redness, lesions or ulcercations.  Neurological: Denies dizziness, difficulty with memory, difficulty with speech or problems with balance and coordination.  Psych: Denies anxiety, depression, SI/HI.  No other specific complaints in a  complete review of systems (except as listed in HPI above).  Objective:  PE:   BP 132/64 mmHg  Pulse 89  Temp(Src) 98.7 F (37.1 C) (Oral)  Ht '5\' 8"'$  (1.727 m)  Wt 165 lb (74.844 kg)  BMI 25.09 kg/m2  SpO2 95% Wt Readings from Last 3 Encounters:  07/24/15 165 lb (74.844 kg)  01/23/15 168 lb (76.204 kg)  07/20/14 166 lb (75.297 kg)    General: Appears his stated age, chronically ill appearing, in NAD. Skin: Warm, dry and intact. Yeasty rash noted at top of gluteal fold. HEENT: Head: normal shape and size; Eyes: sclera white,  no icterus, conjunctiva pink; Ears: Tm's gray and intact, normal light reflex; Throat/Mouth: Teeth present, mucosa pink and moist, no exudate, lesions or ulcerations noted.  Neck: Neck supple, trachea midline. No masses, lumps or thyromegaly present.  Cardiovascular: Normal rate and rhythm. S1,S2 noted.  No murmur, rubs or gallops noted. No JVD or BLE edema. No carotid bruits noted. Pulmonary/Chest: Normal effort and positive vesicular breath sounds. No respiratory distress. No wheezes, rales or ronchi noted.  Abdomen: Soft and nontender. Normal bowel sounds. No distention or masses noted. Liver, spleen and kidneys non palpable. Musculoskeletal: Strength 5/5 BUE/BLE. No signs of joint swelling. Pain with palpation over the lumbar spine.  Neurological: Alert and oriented. Cranial nerves II-XII grossly intact. Coordination normal.  Psychiatric: Mood and affect normal. Behavior is normal. Judgment and thought content normal.    BMET    Component Value Date/Time   NA 137 01/23/2015 1628   K 4.0 01/23/2015 1628   CL 104 01/23/2015 1628   CO2 26 01/23/2015 1628   GLUCOSE 69* 01/23/2015 1628   BUN 8 01/23/2015 1628   CREATININE 1.00 01/23/2015 1628   CALCIUM 9.6 01/23/2015 1628   GFRNONAA 128.24 08/17/2009 1150   GFRAA  07/11/2009 0415    >60        The eGFR has been calculated using the MDRD equation. This calculation has not been validated in all  clinical situations. eGFR's persistently <60 mL/min signify possible Chronic Kidney Disease.    Lipid Panel     Component Value Date/Time   CHOL 211* 01/23/2015 1628   TRIG 102.0 01/23/2015 1628   HDL 58.00 01/23/2015 1628   CHOLHDL 4 01/23/2015 1628   VLDL 20.4 01/23/2015 1628   LDLCALC 132* 01/23/2015 1628    CBC    Component Value Date/Time   WBC 4.3 01/23/2015 1628   RBC 4.56 01/23/2015 1628   HGB 15.5 01/23/2015 1628   HCT 46.4 01/23/2015 1628   PLT 313.0 01/23/2015 1628   MCV 101.7* 01/23/2015 1628   MCHC 33.3 01/23/2015 1628   RDW 14.5 01/23/2015 1628   LYMPHSABS 1.6 07/19/2013 1621   MONOABS 0.2 07/19/2013 1621   EOSABS 0.0 07/19/2013 1621   BASOSABS 0.0 07/19/2013 1621    Hgb A1C Lab Results  Component Value Date   HGBA1C * 06/28/2009    5.9 (NOTE)                                                                       According to the ADA Clinical Practice Recommendations for 2011, when HbA1c is used as a screening test:   >=6.5%   Diagnostic of Diabetes Mellitus           (if abnormal result  is confirmed)  5.7-6.4%   Increased risk of developing Diabetes Mellitus  References:Diagnosis and Classification of Diabetes Mellitus,Diabetes Care,2011,34(Suppl 1):S62-S69 and Standards of Medical Care in         Diabetes - 2011,Diabetes Care,2011,34  (Suppl 1):S11-S61.      Assessment and Plan:   Medicare Annual Wellness Visit:  Diet: He eats whatever he wants Physical activity: Sedentary Depression/mood screen: Negative Hearing: Intact to whispered voice Visual acuity: Grossly normal, performs annual eye exam  ADLs: Capable Fall risk: None Home safety:  Good Cognitive evaluation: Intact to orientation, naming, recall and repetition EOL planning: HCPOA, full code/ I agree  Preventative Medicine:  Encouraged him to get a flu shot in the fall Pneumovax UTD Prevnar today He declines Tetanus, Zostovax today PSA today Colon Screening UTD Encouraged him  to see an eye doctor and dentist at least annually  Yeast infection of the skin:  eRx for Ketoconazole cream BID until resolved Next appointment: 6 month follow up

## 2015-07-24 NOTE — Assessment & Plan Note (Signed)
Lipid profile today Encouraged him to consume a low fat diet 

## 2015-07-24 NOTE — Assessment & Plan Note (Signed)
He continues to struggle with this but does not want to take any prescription RX for it Will monitor

## 2015-07-24 NOTE — Assessment & Plan Note (Signed)
-   Continue Flomax 

## 2015-07-24 NOTE — Assessment & Plan Note (Signed)
On Cardizem and Eliquis He no longer wishes to follow with cardiology

## 2015-07-24 NOTE — Assessment & Plan Note (Signed)
Advised him to avoid foods that trigger his reflux

## 2015-07-24 NOTE — Assessment & Plan Note (Signed)
He continues to smoke He declines inhaler- they are too expensive Will refer for lung cancer screening program

## 2015-07-25 LAB — LIPID PANEL
CHOLESTEROL: 201 mg/dL — AB (ref 0–200)
HDL: 69.5 mg/dL (ref >39.00)
LDL Cholesterol: 109 mg/dL — ABNORMAL HIGH (ref 0–99)
NONHDL: 131.88
TRIGLYCERIDES: 114 mg/dL (ref 0.0–149.0)
Total CHOL/HDL Ratio: 3
VLDL: 22.8 mg/dL (ref 0.0–40.0)

## 2015-07-25 LAB — HEPATITIS C ANTIBODY: HCV AB: NEGATIVE

## 2015-07-25 LAB — PSA, MEDICARE: PSA: 0.17 ng/ml (ref 0.10–4.00)

## 2015-07-25 NOTE — Addendum Note (Signed)
Addended by: Lurlean Nanny on: 07/25/2015 11:59 AM   Modules accepted: Orders

## 2015-08-11 ENCOUNTER — Other Ambulatory Visit: Payer: Self-pay

## 2015-08-11 MED ORDER — DILTIAZEM HCL ER COATED BEADS 180 MG PO CP24: 180.0000 mg | ORAL_CAPSULE | Freq: Every day | ORAL | Status: DC

## 2015-08-27 ENCOUNTER — Other Ambulatory Visit: Payer: Self-pay | Admitting: Internal Medicine

## 2015-08-28 NOTE — Telephone Encounter (Signed)
He told me this medication does not help. If it does not help, there is no need to refill and waste his money.

## 2015-09-11 ENCOUNTER — Other Ambulatory Visit: Payer: Self-pay | Admitting: Internal Medicine

## 2015-09-14 ENCOUNTER — Other Ambulatory Visit: Payer: Self-pay

## 2015-09-14 NOTE — Telephone Encounter (Signed)
error 

## 2015-09-14 NOTE — Telephone Encounter (Signed)
[facility] left v/m requesting refill tamsulosin. Pt last annual 07/24/15. Refilled per protocol.

## 2015-09-29 ENCOUNTER — Other Ambulatory Visit: Payer: Self-pay | Admitting: Internal Medicine

## 2015-10-04 ENCOUNTER — Telehealth: Payer: Self-pay | Admitting: Acute Care

## 2015-10-04 NOTE — Telephone Encounter (Signed)
ATC pt and have had several unsuccessful attempts. Referral for lung cancer program has been canceled. Form has been sent to Dr. Reginold Agent office via fax. Form can not be scanned into chart per medical records. Nothing further needed at this time.

## 2015-10-08 ENCOUNTER — Other Ambulatory Visit: Payer: Self-pay | Admitting: Internal Medicine

## 2015-10-08 DIAGNOSIS — B372 Candidiasis of skin and nail: Secondary | ICD-10-CM

## 2015-10-11 ENCOUNTER — Other Ambulatory Visit: Payer: Self-pay | Admitting: Internal Medicine

## 2015-10-28 ENCOUNTER — Other Ambulatory Visit: Payer: Self-pay | Admitting: Cardiology

## 2015-11-14 ENCOUNTER — Ambulatory Visit (INDEPENDENT_AMBULATORY_CARE_PROVIDER_SITE_OTHER)
Admission: RE | Admit: 2015-11-14 | Discharge: 2015-11-14 | Disposition: A | Discharge status: Home / Self Care | Payer: Medicare Other | Source: Ambulatory Visit | Attending: Primary Care | Admitting: Primary Care

## 2015-11-14 ENCOUNTER — Ambulatory Visit (INDEPENDENT_AMBULATORY_CARE_PROVIDER_SITE_OTHER): Payer: Medicare Other | Admitting: Primary Care

## 2015-11-14 VITALS — BP 140/80 | HR 85 | Temp 98.5°F | Ht 68.0 in | Wt 161.0 lb

## 2015-11-14 DIAGNOSIS — R05 Cough: Secondary | ICD-10-CM

## 2015-11-14 DIAGNOSIS — L98491 Non-pressure chronic ulcer of skin of other sites limited to breakdown of skin: Secondary | ICD-10-CM | POA: Diagnosis not present

## 2015-11-14 DIAGNOSIS — R059 Cough, unspecified: Secondary | ICD-10-CM

## 2015-11-14 MED ORDER — BENZONATATE 200 MG PO CAPS: 200.0000 mg | ORAL_CAPSULE | Freq: Three times a day (TID) | ORAL | 0 refills | Status: DC | PRN

## 2015-11-14 NOTE — Progress Notes (Signed)
Subjective:    Patient ID: Kurt [name], male    DOB: [birth year], 75 y.o.   MRN: [id number]  HPI  Kurt [name] is a 75 year old male with a history of COPD and GERD who presents today with a chief complaint of cough. He is a poor historian so his daughter is here providing information for his HPI.   He also reports fatigue, sore throat, chest congestion. His cough has been present for the past 3 days. He ran a fever of 102.1 last night. He's not running a fever today. He denies shortness of breath. He's not taken anything OTC for his cough or fevers.   He's also noticed a "rash" to his buttocks that has been present for the last several days. It is painful. He sits on his buttocks for most of the day. He was diagnosed and treated for a fugal rash in June 2017. His family has not applied anything to his "rash".  Review of Systems  Constitutional: Positive for fatigue and fever. Negative for chills.  HENT: Positive for congestion, postnasal drip and sore throat. Negative for ear pain, rhinorrhea and sinus pressure.   Respiratory: Positive for cough. Negative for shortness of breath and wheezing.   Skin: Positive for wound.       Past Medical History:  Diagnosis Date  . Abnormal chest x-ray   . Alcohol abuse   . Anxiety   . Atrial fibrillation ([place])   . Chronic insomnia   . Cigarette smoker   . Colonic polyp   . COPD (chronic obstructive pulmonary disease) ([place])   . Diverticulosis of colon   . DVT (deep venous thrombosis) ([place])   . Fatigue   . GERD (gastroesophageal reflux disease)   . History of pneumonia   . Hypercholesterolemia   . Lumbosacral spondylosis without myelopathy   . Neck pain   . Urinary frequency      Social History   Social History  . Marital status: Married    Spouse name: N/A  . Number of children: 2  . Years of education: N/A   Occupational History  . Reitred from [employer]   .  Retired   Social History Main Topics  . Smoking status: Current  Every Day Smoker    Packs/day: 1.50    Years: 54.00    Types: Cigarettes  . Smokeless tobacco: Former Systems developer    Types: Chew     Comment:  and still smokes sometimes but not everyday  . Alcohol use 1.2 oz/week    2 Cans of beer per week     Comment: occasional  . Drug use: No  . Sexual activity: Not Currently   Other Topics Concern  . Not on file   Social History Narrative   Lives with wife.      Past Surgical History:  Procedure Laterality Date  . 3 seperate lumbar laminectomies    . right CTS release     Dr. Fredna Dow 12/00    Family History  Problem Relation Age of Onset  . Prostate cancer Father   . Cancer Father     prostate  . Hypertension Sister   . Hypertension Mother     No Known Allergies  Current Outpatient Prescriptions on File Prior to Visit  Medication Sig Dispense Refill  . albuterol (PROVENTIL HFA;VENTOLIN HFA) 108 (90 BASE) MCG/ACT inhaler Inhale 2 puffs into the lungs every 6 (six) hours as needed for wheezing or shortness of breath. 1 Inhaler 2  .  diltiazem (CARDIZEM CD) 180 MG 24 hr capsule Take 1 capsule (180 mg total) by mouth daily. 30 capsule 5  . ELIQUIS 5 MG TABS tablet TAKE ONE TABLET BY MOUTH TWICE DAILY 60 tablet 0  . finasteride (PROSCAR) 5 MG tablet TAKE 1 TABLET (5 MG TOTAL) BY MOUTH DAILY. 30 tablet 2  . gabapentin (NEURONTIN) 300 MG capsule TAKE 1 CAPSULE THREE TIMES A DAY 90 capsule 2  . ibuprofen (ADVIL,MOTRIN) 200 MG tablet Per bottle as needed    . ketoconazole (NIZORAL) 2 % cream APPLY TO AFFECTED AREA EVERY DAY 15 g 0  . LORazepam (ATIVAN) 1 MG tablet TAKE 1/2 TO 1 TABLET BY MOUTH 3 TIMES A DAY AS NEEDED 90 tablet 5  . methocarbamol (ROBAXIN) 500 MG tablet Take 1 tablet (500 mg total) by mouth 3 (three) times daily as needed for muscle spasms. 90 tablet 0  . tamsulosin (FLOMAX) 0.4 MG CAPS capsule TAKE 1 CAPSULE (0.4 MG TOTAL) BY MOUTH DAILY. 30 capsule 10  . traMADol (ULTRAM) 50 MG tablet Take 1 tablet (50 mg total) by mouth 3  (three) times daily as needed for moderate pain. for pain 90 tablet 0   No current facility-administered medications on file prior to visit.     BP 140/80   Pulse 85   Temp 98.5 F (36.9 C) (Oral)   Ht 5\' 8"  (1.727 m)   Wt 161 lb (73 kg)   SpO2 97%   BMI 24.48 kg/m    Objective:   Physical Exam  Constitutional: He appears well-nourished.  HENT:  Right Ear: Tympanic membrane and ear canal normal.  Left Ear: Tympanic membrane and ear canal normal.  Nose: No mucosal edema. Right sinus exhibits no maxillary sinus tenderness and no frontal sinus tenderness. Left sinus exhibits no maxillary sinus tenderness and no frontal sinus tenderness.  Mouth/Throat: Oropharynx is clear and moist.  Eyes: Conjunctivae are normal.  Neck: Neck supple.  Cardiovascular: Normal rate and regular rhythm.   Pulmonary/Chest: Effort normal. He has no decreased breath sounds. He has wheezes in the right upper field and the left upper field. He has rales.  Mild wheezing  Skin: Skin is warm and dry.     Pressure type ulcer to buttocks. Minor skin breakdown with erythema. Non infectious appearing.           Assessment & Plan:  URI:  Cough, chest congestion, fatigue x 3 days. Has not used inhaler or taken anything OTC for symptoms. Exam today with mild wheezing to upper fields, rales, difficulty ausculting lungs. Vitals stable. No increased effort of breathing, he is in no distress. Likely viral, but given questionable lung sounds and fever of 102 yesterday, will rule out pneumonia with chest xray. Xray pending. Discussed Mucinex and use of albuterol inhaler. Rx for Tessalon Pearls sent to pharmacy for cough. Return precautions provided.  Ulcer:  Skin ulcer to buttocks. Unsure of how long, but mild/moderate skin breakdown without exposure to fatty tissue or bone. Moderate erythema, does not appear infectious. Discussed use of A&D antibiotic ointment and sent to wound center for further  evaluation. Stressed importance of changing positions often, refrain from sitting for prolonged periods of time.  Sheral Flow, NP

## 2015-11-14 NOTE — Progress Notes (Signed)
Pre visit review using our clinic review tool, if applicable. No additional management support is needed unless otherwise documented below in the visit note. 

## 2015-11-14 NOTE — Patient Instructions (Signed)
Stop by the front desk and speak with either Rosaria Ferries or Ebony Hail regarding your referral to the [facility].  Apply Triple Antibiotic Ointment (A&D) extra creamy and apply to his buttocks daily until he's seen at the wound center.  Complete xray(s) prior to leaving today.   You may take Benzonatate capsules for cough. Take 1 capsule by mouth three times daily as needed for cough.  Cough/Congestion: Try taking Mucinex DM. This will help loosen up the mucous in your chest. Ensure you take this medication with a full glass of water.  I will be in touch later today or tomorrow. It was a pleasure meeting you!

## 2015-11-21 ENCOUNTER — Encounter: Payer: Medicare Other | Attending: Internal Medicine | Admitting: Internal Medicine

## 2015-11-21 DIAGNOSIS — L89152 Pressure ulcer of sacral region, stage 2: Secondary | ICD-10-CM | POA: Insufficient documentation

## 2015-11-21 DIAGNOSIS — F17200 Nicotine dependence, unspecified, uncomplicated: Secondary | ICD-10-CM | POA: Insufficient documentation

## 2015-11-21 DIAGNOSIS — I252 Old myocardial infarction: Secondary | ICD-10-CM | POA: Diagnosis not present

## 2015-11-21 DIAGNOSIS — M1990 Unspecified osteoarthritis, unspecified site: Secondary | ICD-10-CM | POA: Insufficient documentation

## 2015-11-21 DIAGNOSIS — L89102 Pressure ulcer of unspecified part of back, stage 2: Secondary | ICD-10-CM | POA: Diagnosis not present

## 2015-11-22 NOTE — Progress Notes (Addendum)
ALESSIO, MCQUILLIN ([id number]) Visit Report for 11/21/2015 Allergy List Details Patient Name: XZAVIER, NEITZKE. Date of Service: 11/21/2015 8:45 AM Medical Record Patient Account Number: [id number] Number: Treating RN: Baruch Gouty, RN, BSN, Rita May 25, 1940 [phone] y.o. Other Clinician: Date of Birth/Sex: Male) Treating ROBSON, MICHAEL Primary Care Physician: Webb Silversmith Physician/Extender: G Referring Physician: Dorette Grate in Treatment: 0 Allergies Active Allergies No known Allergies Allergy Notes Electronic Signature(s) Signed: 11/21/2015 5:26:45 PM By: Regan Lemming BSN, RN Entered By: Regan Lemming on 11/21/2015 09:02:40 [name] ([id number]) -------------------------------------------------------------------------------- Arrival Information Details Patient Name: KOREE, RAPPLEYE Date of Service: 11/21/2015 8:45 AM Medical Record Patient Account Number: [id number] Number: Treating RN: Baruch Gouty, RN, BSN, Rita [birth year] [phone] y.o. Other Clinician: Date of Birth/Sex: Male) Treating ROBSON, [place] Primary Care Physician: Webb Silversmith Physician/Extender: G Referring Physician: Dorette Grate in Treatment: 0 Visit Information Patient Arrived: Ambulatory Arrival Time: 08:57 Accompanied By: wife/dtr Transfer Assistance: None Patient Identification Verified: Yes Secondary Verification Process Yes Completed: Patient Requires Transmission-Based No Precautions: Patient Has Alerts: No Electronic Signature(s) Signed: 11/21/2015 5:26:45 PM By: Regan Lemming BSN, RN Entered By: Regan Lemming on 11/21/2015 08:58:19 [name] ([id number]) -------------------------------------------------------------------------------- Clinic Level of Care Assessment Details Patient Name: [name] Date of Service: 11/21/2015 8:45 AM Medical Record Patient Account Number: [id number] Number: Treating RN: Baruch Gouty, RN, BSN, Rita 08-02-1940  [phone] y.o. Other Clinician: Date of Birth/Sex: Male) Treating ROBSON, [place] Primary Care Physician: Webb Silversmith Physician/Extender: G Referring Physician: Dorette Grate in Treatment: 0 Clinic Level of Care Assessment Items TOOL 1 Quantity Score []  - Use when EandM and Procedure is performed on INITIAL visit 0 ASSESSMENTS - Nursing Assessment / Reassessment X - General Physical Exam (combine w/ comprehensive assessment (listed just 1 20 below) when performed on new pt. evals) X - Comprehensive Assessment (HX, ROS, Risk Assessments, Wounds Hx, etc.) 1 25 ASSESSMENTS - Wound and Skin Assessment / Reassessment []  - Dermatologic / Skin Assessment (not related to wound area) 0 ASSESSMENTS - Ostomy and/or Continence Assessment and Care []  - Incontinence Assessment and Management 0 []  - Ostomy Care Assessment and Management (repouching, etc.) 0 PROCESS - Coordination of Care X - Simple Patient / Family Education for ongoing care 1 15 []  - Complex (extensive) Patient / Family Education for ongoing care 0 X - Staff obtains Programmer, systems, Records, Test Results / Process Orders 1 10 []  - Staff telephones HHA, Nursing Homes / Clarify orders / etc 0 []  - Routine Transfer to another Facility (non-emergent condition) 0 []  - Routine Hospital Admission (non-emergent condition) 0 X - New Admissions / Biomedical engineer / Ordering NPWT, Apligraf, etc. 1 15 []  - Emergency Hospital Admission (emergent condition) 0 PROCESS - Special Needs []  - Pediatric / Minor Patient Management 0 ZACKERIAH, SHATZER ([id number]) []  - Isolation Patient Management 0 []  - Hearing / Language / Visual special needs 0 []  - Assessment of Community assistance (transportation, D/C planning, etc.) 0 []  - Additional assistance / Altered mentation 0 []  - Support Surface(s) Assessment (bed, cushion, seat, etc.) 0 INTERVENTIONS - Miscellaneous []  - External ear exam 0 []  - Patient Transfer (multiple staff / Civil Service fast streamer /  Similar devices) 0 []  - Simple Staple / Suture removal (25 or less) 0 []  - Complex Staple / Suture removal (26 or more) 0 []  - Hypo/Hyperglycemic Management (do not check if billed separately) 0 []  - Ankle / Brachial Index (ABI) - do not check if billed separately 0 Has the patient  been seen at the hospital within the last three years: Yes Total Score: 85 Level Of Care: New/Established - Level 3 Electronic Signature(s) Signed: 11/21/2015 5:26:45 PM By: Regan Lemming BSN, RN Entered By: Regan Lemming on 11/21/2015 09:32:30 [name] ([id number]) -------------------------------------------------------------------------------- Encounter Discharge Information Details Patient Name: QUINTELL, MATTERS. Date of Service: 11/21/2015 8:45 AM Medical Record Patient Account Number: [id number] Number: Treating RN: Baruch Gouty, RN, BSN, Rita [birth year] [phone] y.o. Other Clinician: Date of Birth/Sex: Male) Treating ROBSON, MICHAEL Primary Care Physician: Webb Silversmith Physician/Extender: G Referring Physician: Dorette Grate in Treatment: 0 Encounter Discharge Information Items Discharge Pain Level: 0 Discharge Condition: Stable Ambulatory Status: Ambulatory Discharge Destination: Home Transportation: Private Auto Accompanied By: wife/dtr Schedule Follow-up Appointment: No Medication Reconciliation completed and provided to Patient/Care No Neaveh Belanger: Provided on Clinical Summary of Care: 11/21/2015 Form Type Recipient Paper Patient RF Electronic Signature(s) Signed: 11/21/2015 5:26:45 PM By: Regan Lemming BSN, RN Previous Signature: 11/21/2015 9:33:13 AM Version By: Ruthine Dose Entered By: Regan Lemming on 11/21/2015 09:36:52 [name] ([id number]) -------------------------------------------------------------------------------- Lower Extremity Assessment Details Patient Name: [name] Date of Service: 11/21/2015 8:45 AM Medical Record Patient Account Number:  [id number] Number: Treating RN: Baruch Gouty, RN, BSN, Rita [birth year] [phone] y.o. Other Clinician: Date of Birth/Sex: Male) Treating ROBSON, [place] Primary Care Physician: Webb Silversmith Physician/Extender: G Referring Physician: Dorette Grate in Treatment: 0 Electronic Signature(s) Signed: 11/21/2015 5:26:45 PM By: Regan Lemming BSN, RN Entered By: Regan Lemming on 11/21/2015 09:06:04 [name] ([id number]) -------------------------------------------------------------------------------- Multi Wound Chart Details Patient Name: BOURNE, DRABIK Date of Service: 11/21/2015 8:45 AM Medical Record Patient Account Number: [id number] Number: Treating RN: Baruch Gouty, RN, BSN, Rita 1940/12/06 [phone] y.o. Other Clinician: Date of Birth/Sex: Male) Treating ROBSON, [place] Primary Care Physician: Webb Silversmith Physician/Extender: G Referring Physician: Dorette Grate in Treatment: 0 Vital Signs Height(in): 68 Pulse(bpm): 74 Weight(lbs): 165 Blood Pressure 115/76 (mmHg): Body Mass Index(BMI): 25 Temperature(F): 98.2 Respiratory Rate 18 (breaths/min): Photos: [1:No Photos] [N/A:N/A] Wound Location: [1:Back - Medial] [N/A:N/A] Wounding Event: [1:Gradually Appeared] [N/A:N/A] Primary Etiology: [1:Pressure Ulcer] [N/A:N/A] Comorbid History: [1:Cataracts, Anemia, Arrhythmia, Myocardial Infarction, Osteoarthritis] [N/A:N/A] Date Acquired: [1:10/30/2015] [N/A:N/A] Weeks of Treatment: [1:0] [N/A:N/A] Wound Status: [1:Open] [N/A:N/A] Measurements L x W x D 1x1x0.1 [N/A:N/A] (cm) Area (cm) : [1:0.785] [N/A:N/A] Volume (cm) : [1:0.079] [N/A:N/A] Classification: [1:Category/Stage II] [N/A:N/A] Exudate Amount: [1:Small] [N/A:N/A] Exudate Type: [1:Serous] [N/A:N/A] Exudate Color: [1:amber] [N/A:N/A] Wound Margin: [1:Distinct, outline attached] [N/A:N/A] Granulation Amount: [1:Medium (34-66%)] [N/A:N/A] Granulation Quality: [1:Pale] [N/A:N/A] Necrotic  Amount: [1:Small (1-33%)] [N/A:N/A] Necrotic Tissue: [1:Eschar] [N/A:N/A] Exposed Structures: [1:Fascia: No Fat: No Tendon: No Muscle: No Joint: No] [N/A:N/A] Bone: No Limited to Skin Breakdown Epithelialization: None N/A N/A Periwound Skin Texture: Edema: No N/A N/A Excoriation: No Induration: No Callus: No Crepitus: No Fluctuance: No Friable: No Rash: No Scarring: No Periwound Skin Moist: Yes N/A N/A Moisture: Dry/Scaly: Yes Maceration: No Periwound Skin Color: Atrophie Blanche: No N/A N/A Cyanosis: No Ecchymosis: No Erythema: No Hemosiderin Staining: No Mottled: No Pallor: No Rubor: No Tenderness on No N/A N/A Palpation: Wound Preparation: Ulcer Cleansing: N/A N/A Rinsed/Irrigated with Saline Topical Anesthetic Applied: Other: lidocaine 4% Treatment Notes Electronic Signature(s) Signed: 11/21/2015 5:26:45 PM By: Regan Lemming BSN, RN Entered By: Regan Lemming on 11/21/2015 09:21:57 [name] ([id number]) -------------------------------------------------------------------------------- [place] Details Patient Name: RAJI, HEITNER Date of Service: 11/21/2015 8:45 AM Medical Record Patient Account Number: [id number] Number: Treating RN: Baruch Gouty, RN, BSN, Rita 1940/06/04 [phone] y.o. Other  Clinician: Date of Birth/Sex: Male) Treating ROBSON, MICHAEL Primary Care Physician: Webb Silversmith Physician/Extender: G Referring Physician: Dorette Grate in Treatment: 0 Active Inactive Electronic Signature(s) Signed: 12/21/2015 12:42:26 PM By: Gretta Cool RN, BSN, Kim RN, BSN Signed: 02/02/2016 5:30:14 PM By: Regan Lemming BSN, RN Previous Signature: 11/21/2015 5:26:45 PM Version By: Regan Lemming BSN, RN Entered By: Gretta Cool, RN, BSN, Kim on 12/21/2015 12:42:26 THEODUS, MCMURDIE ([id number]) -------------------------------------------------------------------------------- Pain Assessment Details Patient Name: TAUNO, VINCELETTE Date of  Service: 11/21/2015 8:45 AM Medical Record Patient Account Number: [id number] Number: Treating RN: Baruch Gouty, RN, BSN, Rita Aug 17, 1940 [phone] y.o. Other Clinician: Date of Birth/Sex: Male) Treating ROBSON, [place] Primary Care Physician: Webb Silversmith Physician/Extender: G Referring Physician: Dorette Grate in Treatment: 0 Active Problems Location of Pain Severity and Description of Pain Patient Has Paino No Site Locations With Dressing Change: No Pain Management and Medication Current Pain Management: Electronic Signature(s) Signed: 11/21/2015 5:26:45 PM By: Regan Lemming BSN, RN Entered By: Regan Lemming on 11/21/2015 08:58:26 [name] ([id number]) -------------------------------------------------------------------------------- Patient/Caregiver Education Details Patient Name: DENARDO, HIGGS Date of Service: 11/21/2015 8:45 AM Medical Record Patient Account Number: [id number] Number: Treating RN: Baruch Gouty, RN, BSN, Rita Jan 07, 1941 [phone] y.o. Other Clinician: Date of Birth/Gender: Male) Treating ROBSON, [place] Primary Care Physician: Webb Silversmith Physician/Extender: G Referring Physician: Dorette Grate in Treatment: 0 Education Assessment Education Provided To: Patient Education Topics Provided Smoking and Wound Healing: Methods: Explain/Verbal Responses: State content correctly Welcome [facility]: Methods: Explain/Verbal Responses: State content correctly Wound/Skin Impairment: Methods: Explain/Verbal Responses: State content correctly Electronic Signature(s) Signed: 11/21/2015 5:26:45 PM By: Regan Lemming BSN, RN Entered By: Regan Lemming on 11/21/2015 [place], [place]. ([id number]) -------------------------------------------------------------------------------- Wound Assessment Details Patient Name: [name] Date of Service: 11/21/2015 8:45 AM Medical Record Patient Account Number:  [id number] Number: Treating RN: Baruch Gouty, RN, BSN, Rita Jun 16, 1940 [phone] y.o. Other Clinician: Date of Birth/Sex: Male) Treating ROBSON, [place] Primary Care Physician: Webb Silversmith Physician/Extender: G Referring Physician: Dorette Grate in Treatment: 0 Wound Status Wound Number: 1 Primary Pressure Ulcer Etiology: Wound Location: Back - Medial Wound Healed - Epithelialized Wounding Event: Gradually Appeared Status: Date Acquired: 10/30/2015 Comorbid Cataracts, Anemia, Arrhythmia, Weeks Of Treatment: 0 History: Myocardial Infarction, Osteoarthritis Clustered Wound: No Photos Wound Measurements Length: (cm) 0 % Reduct Width: (cm) 0 % Reduct Depth: (cm) 0 Epitheli Area: (cm) 0 Tunneli Volume: (cm) 0 Undermi ion in Area: 100% ion in Volume: 100% alization: None ng: No ning: No Wound Description Classification: Category/Stage II Wound Margin: Distinct, outline attached Exudate Amount: Small Exudate Type: Serous Exudate Color: amber Foul Odor After Cleansing: No Wound Bed Granulation Amount: Medium (34-66%) Exposed Structure Granulation Quality: Pale Fascia Exposed: No Necrotic Amount: Small (1-33%) Fat Layer Exposed: No Necrotic Quality: Eschar Tendon Exposed: No Muscle Exposed: No Joint Exposed: No TIMITHY, DOHN ([id number]) Bone Exposed: No Limited to Skin Breakdown Periwound Skin Texture Texture Color No Abnormalities Noted: No No Abnormalities Noted: No Callus: No Atrophie Blanche: No Crepitus: No Cyanosis: No Excoriation: No Ecchymosis: No Fluctuance: No Erythema: No Friable: No Hemosiderin Staining: No Induration: No Mottled: No Localized Edema: No Pallor: No Rash: No Rubor: No Scarring: No Moisture No Abnormalities Noted: No Dry / Scaly: Yes Maceration: No Moist: Yes Wound Preparation Ulcer Cleansing: Rinsed/Irrigated with Saline Topical Anesthetic Applied: Other: lidocaine 4%, Treatment Notes Wound #1  (Medial Back) 1. Cleansed with: Clean wound with Normal Saline 3. Peri-wound Care: Antifungal powder 4. Dressing Applied: Prisma  Ag 5. Secondary Dressing Applied Bordered Foam Dressing Electronic Signature(s) Signed: 01/16/2016 1:39:12 PM By: Gretta Cool RN, BSN, Kim RN, BSN Signed: 02/02/2016 5:30:14 PM By: Regan Lemming BSN, RN Previous Signature: 11/21/2015 5:26:45 PM Version By: Regan Lemming BSN, RN Entered By: Gretta Cool, RN, BSN, Kim on 12/21/2015 12:43:24 HUBBERT, DEGRAW ([id number]) -------------------------------------------------------------------------------- Vitals Details Patient Name: OLAJIDE, BARGERSTOCK Date of Service: 11/21/2015 8:45 AM Medical Record Patient Account Number: [id number] Number: Treating RN: Baruch Gouty, RN, BSN, Rita 08-15-1940 [phone] y.o. Other Clinician: Date of Birth/Sex: Male) Treating ROBSON, [place] Primary Care Physician: Webb Silversmith Physician/Extender: G Referring Physician: Dorette Grate in Treatment: 0 Vital Signs Time Taken: 08:58 Temperature (F): 98.2 Height (in): 68 Pulse (bpm): 74 Source: Stated Respiratory Rate (breaths/min): 18 Weight (lbs): 165 Blood Pressure (mmHg): 115/76 Source: Measured Reference Range: 80 - 120 mg / dl Body Mass Index (BMI): 25.1 Electronic Signature(s) Signed: 11/21/2015 5:26:45 PM By: Regan Lemming BSN, RN Entered By: Regan Lemming on 11/21/2015 09:02:57

## 2015-11-22 NOTE — Progress Notes (Signed)
GENEVA, LEAVELLE ([id number]) Visit Report for 11/21/2015 Abuse/Suicide Risk Screen Details Patient Name: Kurt [name], Kurt [name]. Date of Service: 11/21/2015 8:45 AM Medical Record Patient Account Number: [id number] Number: Treating RN: Baruch Gouty, RN, BSN, Rita [birth year] [phone] y.o. Other Clinician: Date of Birth/Sex: Male) Treating ROBSON, MICHAEL Primary Care Physician/Extender: Corky Sox, REGINA Physician: Referring Physician: Dorette Grate in Treatment: 0 Abuse/Suicide Risk Screen Items Answer ABUSE/SUICIDE RISK SCREEN: Has anyone close to you tried to hurt or harm you recentlyo No Do you feel uncomfortable with anyone in your familyo No Has anyone forced you do things that you didnot want to doo No Do you have any thoughts of harming yourselfo No Patient displays signs or symptoms of abuse and/or neglect. No Electronic Signature(s) Signed: 11/21/2015 5:26:45 PM By: Regan Lemming BSN, RN Entered By: Regan Lemming on 11/21/2015 09:00:55 Kurt [name] ([id number]) -------------------------------------------------------------------------------- Activities of Daily Living Details Patient Name: Kurt [name] Date of Service: 11/21/2015 8:45 AM Medical Record Patient Account Number: [id number] Number: Treating RN: Baruch Gouty, RN, BSN, Rita 1940-06-22 [phone] y.o. Other Clinician: Date of Birth/Sex: Male) Treating ROBSON, MICHAEL Primary Care Physician/Extender: Corky Sox, REGINA Physician: Referring Physician: Dorette Grate in Treatment: 0 Activities of Daily Living Items Answer Activities of Daily Living (Please select one for each item) Drive Automobile Completely Able Take Medications Completely Able Use Telephone Completely Able Care for Appearance Completely Able Use Toilet Completely Able Bath / Shower Completely Able Dress Self Completely Able Feed Self Completely Able Walk Completely Able Get In / Out Bed Completely Able Housework  Completely Able Prepare Meals Completely Able Handle Money Completely Able Shop for Self Completely Able Electronic Signature(s) Signed: 11/21/2015 5:26:45 PM By: Regan Lemming BSN, RN Entered By: Regan Lemming on 11/21/2015 09:02:16 Kurt [name] ([id number]) -------------------------------------------------------------------------------- Education Assessment Details Patient Name: Kurt [name] Date of Service: 11/21/2015 8:45 AM Medical Record Patient Account Number: [id number] Number: Treating RN: Baruch Gouty, RN, BSN, Rita 15-Sep-1940 [phone] y.o. Other Clinician: Date of Birth/Sex: Male) Treating ROBSON, MICHAEL Primary Care Physician/Extender: Corky Sox, REGINA Physician: Referring Physician: Dorette Grate in Treatment: 0 Primary Learner Assessed: Patient Learning Preferences/Education Level/Primary Language Learning Preference: Explanation Highest Education Level: High School Preferred Language: English Cognitive Barrier Assessment/Beliefs Language Barrier: No Translator Needed: No Physical Barrier Assessment Impaired Vision: Yes Glasses Impaired Hearing: No Decreased Hand dexterity: No Knowledge/Comprehension Assessment Knowledge Level: Medium Comprehension Level: Medium Ability to understand written Medium instructions: Ability to understand verbal Medium instructions: Motivation Assessment Anxiety Level: Calm Cooperation: Cooperative Education Importance: Acknowledges Need Interest in Health Problems: Asks Questions Perception: Coherent Willingness to Engage in Self- Medium Management Activities: Readiness to Engage in Self- Medium Management Activities: Electronic Signature(s) Signed: 11/21/2015 5:26:45 PM By: Regan Lemming BSN, RN Tory, Herminio [place] ([id number]) Entered By: Regan Lemming on 11/21/2015 09:01:46 DAID, TRESCH ([id number]) -------------------------------------------------------------------------------- Fall Risk Assessment  Details Patient Name: Kurt [name] Date of Service: 11/21/2015 8:45 AM Medical Record Patient Account Number: [id number] Number: Treating RN: Baruch Gouty, RN, BSN, Rita 1940-06-13 [phone] y.o. Other Clinician: Date of Birth/Sex: Male) Treating ROBSON, MICHAEL Primary Care Physician/Extender: Corky Sox, REGINA Physician: Referring Physician: Dorette Grate in Treatment: 0 Fall Risk Assessment Items Have you had 2 or more falls in the last 12 monthso 0 No Have you had any fall that resulted in injury in the last 12 monthso 0 No FALL RISK ASSESSMENT: History of falling - immediate or within 3 months 0 No Secondary diagnosis 0 No Ambulatory aid None/bed rest/wheelchair/nurse 0  Yes Crutches/cane/walker 0 No Furniture 0 No IV Access/Saline Lock 0 No Gait/Training Normal/bed rest/immobile 0 Yes Weak 0 No Impaired 0 No Mental Status Oriented to own ability 0 Yes Electronic Signature(s) Signed: 11/21/2015 5:26:45 PM By: Regan Lemming BSN, RN Entered By: Regan Lemming on 11/21/2015 09:01:07 Kurt [name] ([id number]) -------------------------------------------------------------------------------- Foot Assessment Details Patient Name: Kurt [name] Date of Service: 11/21/2015 8:45 AM Medical Record Patient Account Number: [id number] Number: Treating RN: Baruch Gouty, RN, BSN, Rita [birth year] [phone] y.o. Other Clinician: Date of Birth/Sex: Male) Treating ROBSON, MICHAEL Primary Care Physician/Extender: Corky Sox, REGINA Physician: Referring Physician: Dorette Grate in Treatment: 0 Foot Assessment Items Site Locations + = Sensation present, - = Sensation absent, C = Callus, U = Ulcer R = Redness, W = Warmth, M = Maceration, PU = Pre-ulcerative lesion F = Fissure, S = Swelling, D = Dryness Assessment Right: Left: Other Deformity: No No Prior Foot Ulcer: No No Prior Amputation: No No Charcot Joint: No No Ambulatory Status: Ambulatory Without  Help Gait: Steady Electronic Signature(s) Signed: 11/21/2015 5:26:45 PM By: Regan Lemming BSN, RN Entered By: Regan Lemming on 11/21/2015 09:01:20 DEZI, BILL ([id number], KRISTOFF ([id number]) -------------------------------------------------------------------------------- Nutrition Risk Assessment Details Patient Name: LEGEN, EICHEL Date of Service: 11/21/2015 8:45 AM Medical Record Patient Account Number: [id number] Number: Treating RN: Baruch Gouty, RN, BSN, Rita 05/27/1940 [phone] y.o. Other Clinician: Date of Birth/Sex: Male) Treating ROBSON, MICHAEL Primary Care Physician/Extender: Corky Sox, REGINA Physician: Referring Physician: Dorette Grate in Treatment: 0 Height (in): 68 Weight (lbs): 165 Body Mass Index (BMI): 25.1 Nutrition Risk Assessment Items NUTRITION RISK SCREEN: I have an illness or condition that made me change the kind and/or 0 No amount of food I eat I eat fewer than two meals per day 0 No I eat few fruits and vegetables, or milk products 0 No I have three or more drinks of beer, liquor or wine almost every day 0 No I have tooth or mouth problems that make it hard for me to eat 0 No I don't always have enough money to buy the food I need 0 No I eat alone most of the time 0 No I take three or more different prescribed or over-the-counter drugs a 0 No day Without wanting to, I have lost or gained 10 pounds in the last six 0 No months I am not always physically able to shop, cook and/or feed myself 0 No Nutrition Protocols Good Risk Protocol 0 No interventions needed Moderate Risk Protocol Electronic Signature(s) Signed: 11/21/2015 5:26:45 PM By: Regan Lemming BSN, RN Entered By: Regan Lemming on 11/21/2015 09:01:12

## 2015-11-22 NOTE — Progress Notes (Signed)
NEVAAN, AHERNE ([id number]) Visit Report for 11/21/2015 Chief Complaint Document Details Patient Name: Kurt [name], Kurt [name]. Date of Service: 11/21/2015 8:45 AM Medical Record Patient Account Number: [id number] Number: Treating RN: Baruch Gouty, RN, BSN, Rita Nov 06, 1940 [phone] y.o. Other Clinician: Date of Birth/Sex: Male) Treating ROBSON, MICHAEL Primary Care Physician/Extender: Corky Sox, REGINA Physician: Referring Physician: Dorette Grate in Treatment: 0 Information Obtained from: Patient Chief Complaint 11/21/15; review of coccyx ulcer for one month Electronic Signature(s) Signed: 11/22/2015 8:01:59 AM By: Linton Ham MD Entered By: Linton Ham on 11/21/2015 12:59:55 Kurt [name] ([id number]) -------------------------------------------------------------------------------- Debridement Details Patient Name: Kurt [name], Kurt [name] Date of Service: 11/21/2015 8:45 AM Medical Record Patient Account Number: [id number] Number: Treating RN: Baruch Gouty, RN, BSN, Rita August 25, 1940 [phone] y.o. Other Clinician: Date of Birth/Sex: Male) Treating ROBSON, MICHAEL Primary Care Physician/Extender: Corky Sox, REGINA Physician: Referring Physician: Dorette Grate in Treatment: 0 Debridement Performed for Wound #1 Medial Back Assessment: Performed By: Physician Ricard Dillon, MD Debridement: Open Wound/Selective Debridement Selective Description: Pre-procedure Yes - 09:22 Verification/Time Out Taken: Start Time: 09:22 Pain Control: Lidocaine 4% Topical Solution Level: Skin/Dermis Total Area Debrided (L x 1 (cm) x 1 (cm) = 1 (cm) W): Tissue and other Non-Viable, Eschar, Skin, Subcutaneous material debrided: Instrument: Curette Bleeding: Minimum Hemostasis Achieved: Pressure End Time: 09:24 Procedural Pain: 0 Post Procedural Pain: 0 Response to Treatment: Procedure was tolerated well Post Debridement Measurements of Total Wound Length: (cm)  1 Stage: Category/Stage II Width: (cm) 1 Depth: (cm) 0.1 Volume: (cm) 0.079 Character of Wound/Ulcer Post Stable Debridement: Severity of Tissue Post Limited to breakdown of skin Debridement: Post Procedure Diagnosis Same as Pre-procedure Kurt [name], Kurt [name] ([id number]) Electronic Signature(s) Signed: 11/21/2015 5:26:45 PM By: Regan Lemming BSN, RN Signed: 11/22/2015 8:01:59 AM By: Linton Ham MD Entered By: Linton Ham on 11/21/2015 12:58:08 Kurt [name] ([id number]) -------------------------------------------------------------------------------- HPI Details Patient Name: Kurt [name], Kurt [name] Date of Service: 11/21/2015 8:45 AM Medical Record Patient Account Number: [id number] Number: Treating RN: Baruch Gouty, RN, BSN, Rita 19-Mar-1940 [phone] y.o. Other Clinician: Date of Birth/Sex: Male) Treating ROBSON, MICHAEL Primary Care Physician/Extender: Corky Sox, REGINA Physician: Referring Physician: Dorette Grate in Treatment: 0 History of Present Illness HPI Description: 11/21/15; this is a patient who tells me that he developed an extremely pruritic area over his lower sacrum about a month ago. He started scratching fairly repetitively. He developed a wound on his lower sacral/coccyx area as well as some erythema. He states that his primary physician put a prescription ointment on this [not certain what this was] however this did not help. More recently last week he was given Aand D ointment which is really helped quite a bit. His daughter was able to show me pictures of this a week or 2 ago and there was a fairly clear open area as well as intense surrounding erythema extending in the gluteal folds of his Buttocks Electronic Signature(s) Signed: 11/22/2015 8:01:59 AM By: Linton Ham MD Entered By: Linton Ham on 11/21/2015 13:01:32 Kurt [name] ([id number]) -------------------------------------------------------------------------------- Physical  Exam Details Patient Name: Kurt [name], Kurt [name] Date of Service: 11/21/2015 8:45 AM Medical Record Patient Account Number: [id number] Number: Treating RN: Baruch Gouty, RN, BSN, Rita 1940-10-18 [phone] y.o. Other Clinician: Date of Birth/Sex: Male) Treating ROBSON, MICHAEL Primary Care Physician/Extender: Corky Sox, REGINA Physician: Referring Physician: Dorette Grate in Treatment: 0 Constitutional Sitting or standing Blood Pressure is within target range for patient.. Pulse regular and within target range for patient.Marland Kitchen Respirations regular, non-labored and within  target range.. Temperature is normal and within the target range for the patient.. Patient's appearance is neat and clean. Appears in no acute distress. Well nourished and well developed.Marland Kitchen Respiratory Respiratory effort is easy and symmetric bilaterally. Rate is normal at rest and on room air.Marland Kitchen Psychiatric No evidence of depression, anxiety, or agitation. Calm, cooperative, and communicative. Appropriate interactions and affect.. Notes Wound exam; a small superficial wound with surface eschar over the lower sacrum. This was debrided off with a curette. The erythema in his surrounding Botox is quite a bit better in comparison with the cell phone picture his daughter showed me area I would've felt this might of been a tinea reaction however in the ointment showed no help that. Electronic Signature(s) Signed: 11/22/2015 8:01:59 AM By: Linton Ham MD Entered By: Linton Ham on 11/21/2015 13:03:10 Kurt [name] ([id number]) -------------------------------------------------------------------------------- Physician Orders Details Patient Name: Kurt [name], Kurt [name] Date of Service: 11/21/2015 8:45 AM Medical Record Patient Account Number: [id number] Number: Treating RN: Baruch Gouty, RN, BSN, Rita 1940-07-05 [phone] y.o. Other Clinician: Date of Birth/Sex: Male) Treating ROBSON, MICHAEL Primary Care  Physician/Extender: Corky Sox, REGINA Physician: Referring Physician: Dorette Grate in Treatment: 0 Verbal / Phone Orders: Yes Clinician: Afful, RN, BSN, Rita Read Back and Verified: Yes Diagnosis Coding Wound Cleansing Wound #1 Medial Back o Cleanse wound with mild soap and water o May Shower, gently pat wound dry prior to applying new dressing. o May shower with protection. Anesthetic Wound #1 Medial Back o Topical Lidocaine 4% cream applied to wound bed prior to debridement Skin Barriers/Peri-Wound Care Wound #1 Medial Back o Antifungal powder Primary Wound Dressing Wound #1 Medial Back o Prisma Ag Secondary Dressing Wound #1 Medial Back o Boardered Foam Dressing Dressing Change Frequency Wound #1 Medial Back o Change dressing every other day. Follow-up Appointments Wound #1 Medial Back o Return Appointment in 2 weeks. Additional Orders / Instructions Wound #1 Medial Back Kurt [name], Kurt [name] ([id number]) o Stop Smoking o Increase protein intake. o Activity as tolerated Electronic Signature(s) Signed: 11/21/2015 5:26:45 PM By: Regan Lemming BSN, RN Signed: 11/22/2015 8:01:59 AM By: Linton Ham MD Entered By: Regan Lemming on 11/21/2015 09:31:54 Kurt [name] ([id number]) -------------------------------------------------------------------------------- Problem List Details Patient Name: Kurt [name], Kurt [name] Date of Service: 11/21/2015 8:45 AM Medical Record Patient Account Number: [id number] Number: Treating RN: Baruch Gouty, RN, BSN, Rita March 11, 1940 [phone] y.o. 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 Other Clinician: Date of Birth/Sex: Male) Treating ROBSON, MICHAEL Primary Care Physician/Extender: Corky Sox, REGINA Physician: Referring Physician: Dorette Grate in Treatment: 0 Subjective Chief Complaint Information obtained from Patient 11/21/15; review of coccyx ulcer for one month History of Present Illness (HPI) 11/21/15; this is a patient who tells me that he developed an extremely pruritic area over his lower sacrum about a month ago. He started scratching fairly repetitively. He developed a wound on his lower sacral/coccyx area as well as some erythema. He states that his primary physician  put a prescription ointment on this [not certain what this was] however this did not help. More recently last week he was given Aand D ointment which is really helped quite a bit. His daughter was able to show me pictures of this a week or 2 ago and there was a fairly clear open area as well as intense surrounding erythema extending in the gluteal folds of his Buttocks Wound History Patient presents with 1 open wound that has been present for approximately weeks. Patient has been treating wound in the following manner: dry. Laboratory tests have not been performed in the last month. Patient reportedly has not tested positive for an antibiotic resistant organism. Patient reportedly has not tested positive for osteomyelitis. Patient reportedly has not had testing performed to evaluate circulation in the legs. Patient  History Information obtained from Patient. Allergies No known Allergies Family History Cancer - Mother, Hypertension - Child, Mother, Stroke - Father, No family history of Diabetes, Heart Disease, Hereditary Spherocytosis, Kidney Disease, Lung Disease, Seizures, Thyroid Problems, Tuberculosis. Social History Current every day smoker, Marital Status - Married, Alcohol Use - Moderate, Drug Use - No History, Kurt [name], Kurt [name]. ([id number]) Caffeine Use - Moderate. Medical History Eyes Patient has history of Cataracts Ear/Nose/Mouth/Throat Denies history of Chronic sinus problems/congestion, Middle ear problems Hematologic/Lymphatic Patient has history of Anemia Denies history of Hemophilia, Human Immunodeficiency Virus, Lymphedema, Sickle Cell Disease Respiratory Denies history of Aspiration, Asthma, Chronic Obstructive Pulmonary Disease (COPD), Pneumothorax, Sleep Apnea, Tuberculosis Cardiovascular Patient has history of Arrhythmia, Myocardial Infarction Gastrointestinal Denies history of Cirrhosis , Colitis, Crohn s, Hepatitis A, Hepatitis B, Hepatitis C Endocrine Denies history of Type I Diabetes, Type II Diabetes Genitourinary Denies history of End Stage Renal Disease Immunological Denies history of Lupus Erythematosus, Raynaud s, Scleroderma Integumentary (Skin) Denies history of History of Burn, History of pressure wounds Musculoskeletal Patient has history of Osteoarthritis Neurologic Denies history of Dementia, Neuropathy, Quadriplegia, Paraplegia, Seizure Disorder Oncologic Denies history of Received Chemotherapy, Received Radiation Psychiatric Denies history of Anorexia/bulimia, Confinement Anxiety Review of Systems (ROS) Constitutional Symptoms (General Health) Complains or has symptoms of Fatigue. Eyes Complains or has symptoms of Glasses / Contacts. Ear/Nose/Mouth/Throat The patient has no complaints or symptoms. Hematologic/Lymphatic The patient has no  complaints or symptoms. Respiratory The patient has no complaints or symptoms. Cardiovascular The patient has no complaints or symptoms. Gastrointestinal The patient has no complaints or symptoms. Endocrine The patient has no complaints or symptoms. Kurt [name], Kurt [name] ([id number]) Genitourinary The patient has no complaints or symptoms. Immunological The patient has no complaints or symptoms. Integumentary (Skin) Complains or has symptoms of Wounds. Musculoskeletal The patient has no complaints or symptoms. Neurologic The patient has no complaints or symptoms. Oncologic The patient has no complaints or symptoms. Psychiatric The patient has no complaints or symptoms. Objective Constitutional Sitting or standing Blood Pressure is within target range for patient.. Pulse regular and within target range for patient.Marland Kitchen Respirations regular, non-labored and within target range.. Temperature is normal and within the target range for the patient.. Patient's appearance is neat and clean. Appears in no acute distress. Well nourished and well developed.. Vitals Time Taken: 8:58 AM, Height: 68 in, Source: Stated, Weight: 165 lbs, Source: Measured, BMI: 25.1, Temperature: 98.2 F, Pulse: 74 bpm, Respiratory Rate: 18 breaths/min, Blood Pressure: 115/76 mmHg. Respiratory Respiratory effort is easy and symmetric bilaterally. Rate is normal at rest and on room air.Marland Kitchen Psychiatric No evidence of depression, anxiety, or agitation. Calm, cooperative, and communicative. Appropriate interactions and affect.. General Notes:  Wound exam; a small superficial wound with surface eschar over the lower sacrum. This was debrided off with a curette. The erythema in his surrounding Botox is quite a bit better in comparison with the cell phone picture his daughter showed me area I would've felt this might of been a tinea reaction however in the ointment showed no help that. Integumentary (Hair, Skin) Wound #1  status is Open. Original cause of wound was Gradually Appeared. The wound is located on the Medial Back. The wound measures 1cm length x 1cm width x 0.1cm depth; 0.785cm^2 area and 0.079cm^3 volume. The wound is limited to skin breakdown. There is no tunneling or undermining noted. There is a small amount of serous drainage noted. The wound margin is distinct with the outline attached to the wound Kurt [name], Kurt [name]. ([id number]) base. There is medium (34-66%) pale granulation within the wound bed. There is a small (1-33%) amount of necrotic tissue within the wound bed including Eschar. The periwound skin appearance exhibited: Dry/Scaly, Moist. The periwound skin appearance did not exhibit: Callus, Crepitus, Excoriation, Fluctuance, Friable, Induration, Localized Edema, Rash, Scarring, Maceration, Atrophie Blanche, Cyanosis, Ecchymosis, Hemosiderin Staining, Mottled, Pallor, Rubor, Erythema. Assessment Active Problems ICD-10 L89.152 - Pressure ulcer of sacral region, stage 2 Procedures Wound #1 Wound #1 is a Pressure Ulcer located on the Medial Back . There was a Skin/Dermis Open Wound/Selective [phone]) debridement with total area of 1 sq cm performed by Ricard Dillon, MD. with the following instrument(s): Curette to remove Non-Viable tissue/material including Eschar, Skin, and Subcutaneous after achieving pain control using Lidocaine 4% Topical Solution. A time out was conducted at 09:22, prior to the start of the procedure. A Minimum amount of bleeding was controlled with Pressure. The procedure was tolerated well with a pain level of 0 throughout and a pain level of 0 following the procedure. Post Debridement Measurements: 1cm length x 1cm width x 0.1cm depth; 0.079cm^3 volume. Post debridement Stage noted as Category/Stage II. Character of Wound/Ulcer Post Debridement is stable. Severity of Tissue Post Debridement is: Limited to breakdown of skin. Post procedure Diagnosis Wound  #1: Same as Pre-Procedure Plan Wound Cleansing: Wound #1 Medial Back: Cleanse wound with mild soap and water May Shower, gently pat wound dry prior to applying new dressing. May shower with protection. Anesthetic: Kurt [name], DEETER ([id number]) Wound #1 Medial Back: Topical Lidocaine 4% cream applied to wound bed prior to debridement Skin Barriers/Peri-Wound Care: Wound #1 Medial Back: Antifungal powder Primary Wound Dressing: Wound #1 Medial Back: Prisma Ag Secondary Dressing: Wound #1 Medial Back: Boardered Foam Dressing Dressing Change Frequency: Wound #1 Medial Back: Change dressing every other day. Follow-up Appointments: Wound #1 Medial Back: Return Appointment in 2 weeks. Additional Orders / Instructions: Wound #1 Medial Back: Stop Smoking Increase protein intake. Activity as tolerated #1 we applied Prisma to the small open area over his lower sacrum/coccyx with a foam dressing. #2 added Lotrimin cream under the A and D Ointment. There was evidence of a 1017 we'll distruction here possibly secondary to a tinea reaction and/or some form of contact dermatitis. All of this however appears better. #3 I think this is on the way to healing up. I'm not exactly sure what set this off in the first place however. Electronic Signature(s) Signed: 11/22/2015 8:01:59 AM By: Linton Ham MD Entered By: Linton Ham on 11/21/2015 13:04:24 Kurt [name] ([id number]) -------------------------------------------------------------------------------- ROS/PFSH Details Patient Name: EARNEY, SCHOFIELD Date of Service: 11/21/2015 8:45 AM Medical Record Patient Account Number: [id number] Number:  Treating RN: Baruch Gouty, RN, BSN, Velva Harman March 26, 1940 [phone] y.o. Other Clinician: Date of Birth/Sex: Male) Treating ROBSON, MICHAEL Primary Care Physician/Extender: Corky Sox, REGINA Physician: Referring Physician: Dorette Grate in Treatment: 0 Information Obtained  From Patient Wound History Do you currently have one or more open woundso Yes How many open wounds do you currently haveo 1 Approximately how long have you had your woundso weeks How have you been treating your wound(s) until nowo dry Has your wound(s) ever healed and then re-openedo No Have you had any lab work done in the past montho No Have you tested positive for an antibiotic resistant organism (MRSA, VRE)o No Have you tested positive for osteomyelitis (bone infection)o No Have you had any tests for circulation on your legso No Constitutional Symptoms (General Health) Complaints and Symptoms: Positive for: Fatigue Eyes Complaints and Symptoms: Positive for: Glasses / Contacts Medical History: Positive for: Cataracts Integumentary (Skin) Complaints and Symptoms: Positive for: Wounds Medical History: Negative for: History of Burn; History of pressure wounds Ear/Nose/Mouth/Throat Complaints and Symptoms: No Complaints or Symptoms DELRICK, [place] ([id number]) Medical History: Negative for: Chronic sinus problems/congestion; Middle ear problems Hematologic/Lymphatic Complaints and Symptoms: No Complaints or Symptoms Medical History: Positive for: Anemia Negative for: Hemophilia; Human Immunodeficiency Virus; Lymphedema; Sickle Cell Disease Respiratory Complaints and Symptoms: No Complaints or Symptoms Medical History: Negative for: Aspiration; Asthma; Chronic Obstructive Pulmonary Disease (COPD); Pneumothorax; Sleep Apnea; Tuberculosis Cardiovascular Complaints and Symptoms: No Complaints or Symptoms Medical History: Positive for: Arrhythmia; Myocardial Infarction Gastrointestinal Complaints and Symptoms: No Complaints or Symptoms Medical History: Negative for: Cirrhosis ; Colitis; Crohnos; Hepatitis A; Hepatitis B; Hepatitis C Endocrine Complaints and Symptoms: No Complaints or Symptoms Medical History: Negative for: Type I Diabetes; Type II  Diabetes Genitourinary Complaints and Symptoms: No Complaints or Symptoms Medical HistoryCHINEMEREM, ALDABA ([id number]) Negative for: End Stage Renal Disease Immunological Complaints and Symptoms: No Complaints or Symptoms Medical History: Negative for: Lupus Erythematosus; Raynaudos; Scleroderma Musculoskeletal Complaints and Symptoms: No Complaints or Symptoms Medical History: Positive for: Osteoarthritis Neurologic Complaints and Symptoms: No Complaints or Symptoms Medical History: Negative for: Dementia; Neuropathy; Quadriplegia; Paraplegia; Seizure Disorder Oncologic Complaints and Symptoms: No Complaints or Symptoms Medical History: Negative for: Received Chemotherapy; Received Radiation Psychiatric Complaints and Symptoms: No Complaints or Symptoms Medical History: Negative for: Anorexia/bulimia; Confinement Anxiety HBO Extended History Items Eyes: Cataracts Family and Social History Cancer: Yes - Mother; Diabetes: No; Heart Disease: No; Hereditary Spherocytosis: No; Hypertension: Yes - Child, Mother; Kidney Disease: No; Lung Disease: No; Seizures: No; Stroke: Yes - Father; Thyroid Problems: No; Tuberculosis: No; Current every day smoker; Marital Status - Married; Alcohol Use: Moderate; Drug Use: No History; Caffeine Use: Moderate; Financial Concerns: No; Food, Clothing or JARROLD, KOHLMAN ([id number]) Shelter Needs: No; Support System Lacking: No; Transportation Concerns: No; Advanced Directives: No; Living Will: No Electronic Signature(s) Signed: 11/21/2015 5:26:45 PM By: Regan Lemming BSN, RN Signed: 11/22/2015 8:01:59 AM By: Linton Ham MD Entered By: Regan Lemming on 11/21/2015 09:05:50 Kurt [name] ([id number]) -------------------------------------------------------------------------------- SuperBill Details Patient Name: PROCOPIO, ROVNER Date of Service: 11/21/2015 Medical Record Patient Account Number: [id number] Number: Treating  RN: Baruch Gouty, RN, BSN, Rita 04/17/1940 [phone] y.o. 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

## 2015-11-27 ENCOUNTER — Other Ambulatory Visit: Payer: Self-pay | Admitting: Cardiology

## 2015-11-30 ENCOUNTER — Telehealth: Payer: Self-pay | Admitting: *Deleted

## 2015-11-30 NOTE — Telephone Encounter (Signed)
Can you call and see if he is taking this, if so, okay to refill

## 2015-11-30 NOTE — Telephone Encounter (Signed)
Pharmacy left msg on triage requesting refill on pt Eliquis. Pt see Webb Silversmith, NP forwarding to to her...Johny Chess

## 2015-12-06 ENCOUNTER — Ambulatory Visit: Payer: Medicare Other | Admitting: Internal Medicine

## 2015-12-27 ENCOUNTER — Other Ambulatory Visit: Payer: Self-pay | Admitting: Internal Medicine

## 2016-01-01 ENCOUNTER — Other Ambulatory Visit: Payer: Self-pay | Admitting: Cardiology

## 2016-01-01 ENCOUNTER — Other Ambulatory Visit: Payer: Self-pay | Admitting: Internal Medicine

## 2016-01-11 ENCOUNTER — Telehealth: Payer: Self-pay | Admitting: Internal Medicine

## 2016-01-11 NOTE — Telephone Encounter (Signed)
This is something I could have added on. He should not have gone to ER

## 2016-01-11 NOTE — Telephone Encounter (Signed)
[facility] Patient Name: Kurt [name] DOB: [birth year] Initial Comment Caller states c/o back pain that is radiating down his legs. Caller requests pain medication. Nurse Assessment Nurse: Marcelline Deist, RN, Lynda Date/Time (Eastern Time): 01/11/2016 10:24:17 AM Confirm and document reason for call. If symptomatic, describe symptoms. ---Caller states c/o lower back/hip pain that is radiating down his legs, more on right side. Caller requests pain medication. Symptoms started this week. Having difficulty walking. Tried Advil & ice/heating pad. His pain is a 10/10. Does the patient have any new or worsening symptoms? ---Yes Will a triage be completed? ---Yes Related visit to physician within the last 2 weeks? ---No Does the PT have any chronic conditions? (i.e. diabetes, asthma, etc.) ---Yes List chronic conditions. ---heart pills, back issues/surgeries, prostrate rx Is this a behavioral health or substance abuse call? ---No Guidelines Guideline Title Affirmed Question Affirmed Notes Back Pain Weakness of a leg or foot (e.g., unable to bear weight, dragging foot) Final Disposition User Go to ED Now (or PCP triage) Marcelline Deist, RN, Lynda Comments Caller gives contact #'s for his daughter who might be able to take him to the ER. Herma Ard [phone] or [phone]. Nurse attempted to reach daughter of patient, but unable to, left voice mail message. Referrals [facility] - ED Disagree/Comply: Comply

## 2016-01-15 ENCOUNTER — Encounter (HOSPITAL_COMMUNITY): Payer: Self-pay | Admitting: *Deleted

## 2016-01-15 ENCOUNTER — Emergency Department (HOSPITAL_COMMUNITY): Payer: Medicare Other

## 2016-01-15 ENCOUNTER — Emergency Department (HOSPITAL_COMMUNITY)
Admission: EM | Admit: 2016-01-15 | Discharge: 2016-01-15 | Disposition: A | Discharge status: Home / Self Care | Payer: Medicare Other | Attending: Emergency Medicine | Admitting: Emergency Medicine

## 2016-01-15 DIAGNOSIS — R102 Pelvic and perineal pain: Secondary | ICD-10-CM | POA: Diagnosis not present

## 2016-01-15 DIAGNOSIS — Z7901 Long term (current) use of anticoagulants: Secondary | ICD-10-CM | POA: Diagnosis not present

## 2016-01-15 DIAGNOSIS — F17210 Nicotine dependence, cigarettes, uncomplicated: Secondary | ICD-10-CM | POA: Insufficient documentation

## 2016-01-15 DIAGNOSIS — M5440 Lumbago with sciatica, unspecified side: Secondary | ICD-10-CM | POA: Insufficient documentation

## 2016-01-15 DIAGNOSIS — J449 Chronic obstructive pulmonary disease, unspecified: Secondary | ICD-10-CM | POA: Insufficient documentation

## 2016-01-15 DIAGNOSIS — R350 Frequency of micturition: Secondary | ICD-10-CM | POA: Diagnosis not present

## 2016-01-15 DIAGNOSIS — Z79899 Other long term (current) drug therapy: Secondary | ICD-10-CM | POA: Insufficient documentation

## 2016-01-15 DIAGNOSIS — G8929 Other chronic pain: Secondary | ICD-10-CM

## 2016-01-15 DIAGNOSIS — M545 Low back pain: Secondary | ICD-10-CM | POA: Diagnosis present

## 2016-01-15 LAB — BASIC METABOLIC PANEL
Anion gap: 7 (ref 5–15)
BUN: 6 mg/dL (ref 6–20)
CALCIUM: 9.3 mg/dL (ref 8.9–10.3)
CO2: 26 mmol/L (ref 22–32)
CREATININE: 0.92 mg/dL (ref 0.61–1.24)
Chloride: 106 mmol/L (ref 101–111)
GFR calc non Af Amer: >60 mL/min (ref >60)
GLUCOSE: 103 mg/dL — AB (ref 65–99)
Potassium: 3.9 mmol/L (ref 3.5–5.1)
Sodium: 139 mmol/L (ref 135–145)

## 2016-01-15 LAB — CBC
HCT: 43.7 % (ref 39.0–52.0)
Hemoglobin: 15.5 g/dL (ref 13.0–17.0)
MCH: 33.9 pg (ref 26.0–34.0)
MCHC: 35.5 g/dL (ref 30.0–36.0)
MCV: 95.6 fL (ref 78.0–100.0)
PLATELETS: 308 10*3/uL (ref 150–400)
RBC: 4.57 MIL/uL (ref 4.22–5.81)
RDW: 13.9 % (ref 11.5–15.5)
WBC: 4 10*3/uL (ref 4.0–10.5)

## 2016-01-15 LAB — URINALYSIS, ROUTINE W REFLEX MICROSCOPIC
BILIRUBIN URINE: NEGATIVE
GLUCOSE, UA: NEGATIVE mg/dL
HGB URINE DIPSTICK: NEGATIVE
KETONES UR: NEGATIVE mg/dL
Leukocytes, UA: NEGATIVE
Nitrite: NEGATIVE
PH: 6 (ref 5.0–8.0)
Protein, ur: NEGATIVE mg/dL
SPECIFIC GRAVITY, URINE: 1.002 — AB (ref 1.005–1.030)

## 2016-01-15 MED ORDER — METHOCARBAMOL 500 MG PO TABS: 500.0000 mg | ORAL_TABLET | Freq: Three times a day (TID) | ORAL | 0 refills | Status: DC | PRN

## 2016-01-15 MED ORDER — TRAMADOL HCL 50 MG PO TABS: 50.0000 mg | ORAL_TABLET | Freq: Three times a day (TID) | ORAL | 0 refills | Status: DC | PRN

## 2016-01-15 NOTE — ED Provider Notes (Signed)
[facility] DEPT Provider Note   CSN: [id number] Arrival date & time: 01/15/16  1247  By signing my name below, I, Kurt [name], attest that this documentation has been prepared under the direction and in the presence of [name], MD.  Electronically Signed: Julien Nordmann, ED Scribe. 01/15/16. 4:53 PM.    History   Chief Complaint Chief Complaint  Patient presents with  . Back Pain  . Urinary Frequency   The history is provided by the patient. No language interpreter was used.   HPI Comments: Kurt [name] is a 75 y.o. male who presents to the Emergency Department complaining of chronic, gradual worsening, lower back pain that radiates down his right leg. No recent trauma or falls. Patient has chronic low back pain. States this has gradually worsened. Has not seen his spinal surgeon for several years. Patient states he is having some weakness in the leg and numbness to the bottom of the foot. Pt has an enlarged prostate and has been taking finasteride. Complains of urinary frequency but denies dysuria or incontinence. He notes that he has had multiple surgeries on his back and reports the last operation being 10+ years ago. He has been taking advil to relieve his pain without relief. Pt is able to ambulate and notes sometimes his right leg may give out of him. No fever or chills. No abdominal pain, nausea or vomiting.  Past Medical History:  Diagnosis Date  . Abnormal chest x-ray   . Alcohol abuse   . Anxiety   . Atrial fibrillation ([place])   . Chronic insomnia   . Cigarette smoker   . Colonic polyp   . COPD (chronic obstructive pulmonary disease) ([place])   . Diverticulosis of colon   . DVT (deep venous thrombosis) ([place])   . Fatigue   . GERD (gastroesophageal reflux disease)   . History of pneumonia   . Hypercholesterolemia   . Lumbosacral spondylosis without myelopathy   . Neck pain   . Urinary frequency     Patient Active Problem List   Diagnosis Date Noted    . BPH (benign prostatic hyperplasia) 01/23/2015  . PAROXYSMAL ATRIAL FIBRILLATION 10/31/2009  . COLONIC POLYPS 12/21/2007  . HYPERCHOLESTEROLEMIA 12/21/2007  . Anxiety state 12/21/2007  . INSOMNIA, CHRONIC 12/21/2007  . Peripheral vascular disease ([place]) 12/21/2007  . Diverticulosis of large intestine 12/21/2007  . LUMBOSACRAL SPONDYLOSIS WITHOUT MYELOPATHY 12/21/2007  . URINARY FREQUENCY 12/21/2007  . COPD mixed type ([place]) 07/24/2007  . GERD 07/24/2007    Past Surgical History:  Procedure Laterality Date  . 3 seperate lumbar laminectomies    . right CTS release     Dr. Fredna Dow 12/00       Home Medications    Prior to Admission medications   Medication Sig Start Date End Date Taking? Authorizing Provider  albuterol (PROVENTIL HFA;VENTOLIN HFA) 108 (90 BASE) MCG/ACT inhaler Inhale 2 puffs into the lungs every 6 (six) hours as needed for wheezing or shortness of breath. 05/22/13   Gregor Hams, MD  apixaban (ELIQUIS) 5 MG TABS tablet Take 1 tablet by mouth twice daily, need office appt for further refills 01/02/16   [name], MD  benzonatate (TESSALON) 200 MG capsule Take 1 capsule (200 mg total) by mouth 3 (three) times daily as needed for cough. 11/14/15   Pleas Koch, NP  diltiazem (CARDIZEM CD) 180 MG 24 hr capsule Take 1 capsule (180 mg total) by mouth daily. 08/11/15   [name], NP  finasteride [facility])  5 MG tablet TAKE 1 TABLET (5 MG TOTAL) BY MOUTH DAILY. 01/01/16   [name], NP  gabapentin (NEURONTIN) 300 MG capsule TAKE 1 CAPSULE THREE TIMES A DAY 12/27/15   [name], NP  ibuprofen (ADVIL,MOTRIN) 200 MG tablet Per bottle as needed    Historical Provider, MD  ketoconazole (NIZORAL) 2 % cream APPLY TO AFFECTED AREA EVERY DAY 10/10/15   [name], NP  LORazepam (ATIVAN) 1 MG tablet TAKE 1/2 TO 1 TABLET BY MOUTH 3 TIMES A DAY AS NEEDED    [name], MD  methocarbamol (ROBAXIN) 500 MG tablet Take 1 tablet (500 mg total) by mouth 3 (three)  times daily as needed for muscle spasms. 01/15/16   [name], MD  tamsulosin (FLOMAX) 0.4 MG CAPS capsule TAKE 1 CAPSULE (0.4 MG TOTAL) BY MOUTH DAILY. 09/14/15   [name], NP  traMADol (ULTRAM) 50 MG tablet Take 1 tablet (50 mg total) by mouth 3 (three) times daily as needed for moderate pain. for pain 01/15/16   [name], MD    Family History Family History  Problem Relation Age of Onset  . Prostate cancer Father   . Cancer Father     prostate  . Hypertension Sister   . Hypertension Mother     Social History Social History  Substance Use Topics  . Smoking status: Current Every Day Smoker    Packs/day: 1.50    Years: 54.00    Types: Cigarettes  . Smokeless tobacco: Former Systems developer    Types: Chew     Comment:  and still smokes sometimes but not everyday  . Alcohol use 1.2 oz/week    2 Cans of beer per week     Comment: occasional     Allergies   Patient has no known allergies.   Review of Systems Review of Systems  Constitutional: Negative for chills and fever.  Genitourinary: Positive for frequency.  Musculoskeletal: Positive for back pain.  Neurological: Positive for numbness.  All other systems reviewed and are negative.    Physical Exam Updated Vital Signs BP 116/82   Pulse 94   Temp 97.7 F (36.5 C) (Oral)   Resp 20   Ht 5\' 11"  (1.803 m)   Wt 165 lb (74.8 kg)   SpO2 97%   BMI 23.01 kg/m   Physical Exam   ED Treatments / Results  DIAGNOSTIC STUDIES: Oxygen Saturation is 97% on RA, normal by my interpretation.  COORDINATION OF CARE:  4:51 PM Discussed treatment plan with pt at bedside and pt agreed to plan.  Labs (all labs ordered are listed, but only abnormal results are displayed) Labs Reviewed  URINALYSIS, ROUTINE W REFLEX MICROSCOPIC - Abnormal; Notable for the following:       Result Value   Color, Urine STRAW (*)    Specific Gravity, Urine 1.002 (*)    All other components within normal limits  BASIC METABOLIC PANEL -  Abnormal; Notable for the following:    Glucose, Bld 103 (*)    All other components within normal limits  CBC    EKG  EKG Interpretation None       Radiology Dg Lumbar Spine 2-3 Views  Result Date: 01/15/2016 CLINICAL DATA:  Right-sided pelvic pain for several weeks, no known injury, initial encounter EXAM: LUMBAR SPINE - 2-3 VIEW COMPARISON:  None. FINDINGS: Five lumbar type vertebral bodies are well visualized. Vertebral body height is well maintained. Disc space narrowing is noted from L2 to  to S1. Mild loss of the normal lumbar lordosis is seen. No anterolisthesis is noted. IMPRESSION: Multilevel degenerative change without acute abnormality. Electronically Signed   By: Inez Catalina M.D.   On: 01/15/2016 18:15    Procedures Procedures (including critical care time)  Medications Ordered in ED Medications - No data to display   Initial Impression / Assessment and Plan / ED Course  I have reviewed the triage vital signs and the nursing notes.  Pertinent labs & imaging results that were available during my care of the patient were reviewed by me and considered in my medical decision making (see chart for details).  Clinical Course     I personally performed the services described in this documentation, which was scribed in my presence. The recorded information has been reviewed and is accurate.    Final Clinical Impressions(s) / ED Diagnoses   Final diagnoses:  Chronic midline low back pain with sciatica, sciatica laterality unspecified  Urinary frequency   Post void residual was 20 mL. No evidence of cauda equina. Patient does have ongoing radiculopathy and will need see his spinal surgeon again. Advised follow-up with urologist as well. Return precautions been given. New Prescriptions Current Discharge Medication List       [name], MD 01/15/16 1842

## 2016-01-15 NOTE — ED Notes (Signed)
Patient verbalized understanding of discharge instructions and denies any further needs or questions at this time. VS stable. Patient ambulatory with steady gait. RN escorted to ED entrance in wheelchair.   

## 2016-01-15 NOTE — ED Notes (Signed)
Patient ambulated to restroom with steady gait. Bladder scan volume pre-void 198 and post-void 22. MD aware. Pt denies urgency at this time.

## 2016-01-15 NOTE — ED Triage Notes (Signed)
Pt came today b/c of increasing lower back pain that radiates down R leg.  Also c/o urinary frequency, (like every 5 minutes), but also states has prostates issues and takes "water pills".

## 2016-01-23 ENCOUNTER — Ambulatory Visit (INDEPENDENT_AMBULATORY_CARE_PROVIDER_SITE_OTHER): Payer: Medicare Other | Admitting: Internal Medicine

## 2016-01-23 ENCOUNTER — Encounter: Payer: Self-pay | Admitting: Internal Medicine

## 2016-01-23 ENCOUNTER — Other Ambulatory Visit: Payer: Self-pay | Admitting: Cardiology

## 2016-01-23 VITALS — BP 124/80 | HR 77 | Temp 98.1°F | Wt 164.0 lb

## 2016-01-23 DIAGNOSIS — M48061 Spinal stenosis, lumbar region without neurogenic claudication: Secondary | ICD-10-CM | POA: Diagnosis not present

## 2016-01-23 DIAGNOSIS — J449 Chronic obstructive pulmonary disease, unspecified: Secondary | ICD-10-CM | POA: Diagnosis not present

## 2016-01-23 DIAGNOSIS — G4700 Insomnia, unspecified: Secondary | ICD-10-CM

## 2016-01-23 DIAGNOSIS — I480 Paroxysmal atrial fibrillation: Secondary | ICD-10-CM

## 2016-01-23 DIAGNOSIS — N401 Enlarged prostate with lower urinary tract symptoms: Secondary | ICD-10-CM

## 2016-01-23 DIAGNOSIS — R350 Frequency of micturition: Secondary | ICD-10-CM

## 2016-01-23 DIAGNOSIS — M47817 Spondylosis without myelopathy or radiculopathy, lumbosacral region: Secondary | ICD-10-CM

## 2016-01-23 DIAGNOSIS — K219 Gastro-esophageal reflux disease without esophagitis: Secondary | ICD-10-CM

## 2016-01-23 DIAGNOSIS — E7800 Pure hypercholesterolemia, unspecified: Secondary | ICD-10-CM

## 2016-01-23 DIAGNOSIS — I739 Peripheral vascular disease, unspecified: Secondary | ICD-10-CM | POA: Diagnosis not present

## 2016-01-23 MED ORDER — TRAZODONE HCL 50 MG PO TABS: 25.0000 mg | ORAL_TABLET | Freq: Every evening | ORAL | 2 refills | Status: DC | PRN

## 2016-01-23 NOTE — Patient Instructions (Signed)
Spinal Stenosis °Spinal stenosis is when the open spaces between the bones of your spine (vertebrae) get smaller (narrow). It is caused by bone pushing on the open spaces of your spine. This puts pressure on your spine and the nerves in your spine. You may be given medicine to lessen the puffiness (inflammation) in your nerves. Other times, surgery is needed. °HOME CARE °· Change positions when you sit, stand, and lie. This can help take pressure off your nerves. °· Do exercises as told by your doctor to strengthen the middle part of your body. °· Lose weight if your doctor suggests it. This takes pressure off your spine. °· Take all medicine as told by your doctor. °MAKE SURE YOU: °· Understand these instructions. °· Will watch your condition. °· Will get help right away if you are not doing well or get worse. °This information is not intended to replace advice given to you by your health care provider. Make sure you discuss any questions you have with your health care provider. °Document Released: 05/17/2010 Document Revised: 09/23/2012 Document Reviewed: 05/01/2012 °Elsevier Interactive Patient Education © 2017 Elsevier Inc. ° °

## 2016-01-23 NOTE — Progress Notes (Signed)
Subjective:    Patient ID: Kurt [name], male    DOB: Jan 31, 1941, 75 y.o.   MRN: [id number]  HPI  Pt presents to the clinic today for his 6 month follow up of chronic conditions.  COPD: He reports his breathing is fine. He continues to smoke 1.5 ppd, and is not interested in quitting. He has an Albuterol inhaler but does not use it.   Afib: Paroxysmal. He is managed on Cardizem and Eliquis. He reports the Eliquis is really expensive. He denies chest pain, palpitations or shortness of breath. He no longer wants to follow with cardiology.   HLD:  His last LDL was 109, 07/2015. He is not taking any cholesterol lowering medication. He does try to consume a low fat diet.   GERD: Intermittent. He is not sure what triggers this. He does have associated bloating. He denies constipation or diarrhea. He takes Tums as needed with good relief.    PVD: He denies pain in his legs.   Insomnia: He reports he can not fall asleep. He thinks this is related to stress. He is not taking anything OTC for this, but reports he is interested in taking something for this.   BPH: He reports he urinates all the time, day and night. He is on Flomax and Proscar but reports it is not helping.    Chronic low back pain: This is his biggest problem. MRI from 2012 showed severe spinal stenosis of L3-L4. He has seen neurosurgery in the past but refused surgery. He reports the pain shoots down his right leg. He does have associated numbness and weakness. He takes Ultram, Neurontin and Robaxin but doesn't notice that it helps. He is interested in seeing if someone could give him back injections.  He is also concerned about memory impairment. He reports his short term is gone. He can put something down and 2 seconds later will not be able to find it. His long term memory is intact. He is not sure if he wants to take anything for this.   Review of Systems      Past Medical History:  Diagnosis Date  . Abnormal chest  x-ray   . Alcohol abuse   . Anxiety   . Atrial fibrillation ([place])   . Chronic insomnia   . Cigarette smoker   . Colonic polyp   . COPD (chronic obstructive pulmonary disease) ([place])   . Diverticulosis of colon   . DVT (deep venous thrombosis) ([place])   . Fatigue   . GERD (gastroesophageal reflux disease)   . History of pneumonia   . Hypercholesterolemia   . Lumbosacral spondylosis without myelopathy   . Neck pain   . Urinary frequency     Current Outpatient Prescriptions  Medication Sig Dispense Refill  . albuterol (PROVENTIL HFA;VENTOLIN HFA) 108 (90 BASE) MCG/ACT inhaler Inhale 2 puffs into the lungs every 6 (six) hours as needed for wheezing or shortness of breath. 1 Inhaler 2  . apixaban (ELIQUIS) 5 MG TABS tablet Take 1 tablet by mouth twice daily, need office appt for further refills 60 tablet 0  . diltiazem (CARDIZEM CD) 180 MG 24 hr capsule Take 1 capsule (180 mg total) by mouth daily. 30 capsule 5  . finasteride (PROSCAR) 5 MG tablet TAKE 1 TABLET (5 MG TOTAL) BY MOUTH DAILY. 30 tablet 2  . gabapentin (NEURONTIN) 300 MG capsule TAKE 1 CAPSULE THREE TIMES A DAY 90 capsule 2  . ibuprofen (ADVIL,MOTRIN) 200 MG tablet Per bottle as  needed    . ketoconazole (NIZORAL) 2 % cream APPLY TO AFFECTED AREA EVERY DAY 15 g 0  . LORazepam (ATIVAN) 1 MG tablet TAKE 1/2 TO 1 TABLET BY MOUTH 3 TIMES A DAY AS NEEDED 90 tablet 5  . methocarbamol (ROBAXIN) 500 MG tablet Take 1 tablet (500 mg total) by mouth 3 (three) times daily as needed for muscle spasms. 30 tablet 0  . tamsulosin (FLOMAX) 0.4 MG CAPS capsule TAKE 1 CAPSULE (0.4 MG TOTAL) BY MOUTH DAILY. 30 capsule 10  . traMADol (ULTRAM) 50 MG tablet Take 1 tablet (50 mg total) by mouth 3 (three) times daily as needed for moderate pain. for pain 15 tablet 0   No current facility-administered medications for this visit.     No Known Allergies  Family History  Problem Relation Age of Onset  . Prostate cancer Father   . Cancer Father      prostate  . Hypertension Sister   . Hypertension Mother     Social History   Social History  . Marital status: Married    Spouse name: N/A  . Number of children: 2  . Years of education: N/A   Occupational History  . Reitred from [employer]   .  Retired   Social History Main Topics  . Smoking status: Current Every Day Smoker    Packs/day: 1.50    Years: 54.00    Types: Cigarettes  . Smokeless tobacco: Former Systems developer    Types: Chew     Comment:  and still smokes sometimes but not everyday  . Alcohol use 1.2 oz/week    2 Cans of beer per week     Comment: occasional  . Drug use: No  . Sexual activity: Not Currently   Other Topics Concern  . Not on file   Social History Narrative   Lives with wife.       Constitutional: Pt reports fatigue. Denies fever, malaise, headache or abrupt weight changes.  HEENT: Pt reports blurred vision. Denies eye pain, eye redness, ear pain, ringing in the ears, wax buildup, runny nose, nasal congestion, bloody nose, or sore throat. Respiratory: Denies difficulty breathing, shortness of breath, cough or sputum production.   Cardiovascular: Denies chest pain, chest tightness, palpitations or swelling in the hands or feet.  Gastrointestinal: Pt reports reflux. Denies abdominal pain, bloating, constipation, diarrhea or blood in the stool.  GU: Pt reports urinary frequency and nocturia. Denies urgency, pain with urination, burning sensation, blood in urine, odor or discharge. Musculoskeletal: Pt reports low back and right leg pain. Denies difficulty with gait, muscle pain or joint swelling.  Skin: Denies redness, rashes, lesions or ulcercations.  Neurological: Pt reports insomnia, difficulty with memory and weakness/numbness in his right leg. Denies dizziness, difficulty with speech or problems with balance and coordination.  Psych: Pt reports stress. Denies anxiety, depression, SI/HI.  No other specific complaints in a complete review of systems  (except as listed in HPI above).  Objective:   Physical Exam  BP 124/80   Pulse 77   Temp 98.1 F (36.7 C) (Oral)   Wt 164 lb (74.4 kg)   SpO2 99%   BMI 22.87 kg/m  Wt Readings from Last 3 Encounters:  01/23/16 164 lb (74.4 kg)  01/15/16 165 lb (74.8 kg)  11/14/15 161 lb (73 kg)    General: Appears his stated age, chronically ill appearing, in NAD. Skin: Warm, dry and intact. Cardiovascular: Normal rate and rhythm.  Pulmonary/Chest: Normal effort and positive vesicular  breath sounds. No respiratory distress. No wheezes, rales or ronchi noted.  Abdomen: Soft and nontender. Normal bowel sounds. No distention or masses noted.  Musculoskeletal: Decreased flexion of the spine. Normal rotation and extension. No bony tenderness noted over the lumbar spine. Strength 4/5 RLE, 5/5 LLE. Gait slow but steady. Neurological: Alert and oriented. Sensation decreased to RLE. Psychiatric: Mood and affect flat.  BMET    Component Value Date/Time   NA 139 01/15/2016 1309   K 3.9 01/15/2016 1309   CL 106 01/15/2016 1309   CO2 26 01/15/2016 1309   GLUCOSE 103 (H) 01/15/2016 1309   BUN 6 01/15/2016 1309   CREATININE 0.92 01/15/2016 1309   CALCIUM 9.3 01/15/2016 1309   GFRNONAA >60 01/15/2016 1309   GFRAA >60 01/15/2016 1309    Lipid Panel     Component Value Date/Time   CHOL 201 (H) 07/24/2015 1522   TRIG 114.0 07/24/2015 1522   HDL 69.50 07/24/2015 1522   CHOLHDL 3 07/24/2015 1522   VLDL 22.8 07/24/2015 1522   LDLCALC 109 (H) 07/24/2015 1522    CBC    Component Value Date/Time   WBC 4.0 01/15/2016 1309   RBC 4.57 01/15/2016 1309   HGB 15.5 01/15/2016 1309   HCT 43.7 01/15/2016 1309   PLT 308 01/15/2016 1309   MCV 95.6 01/15/2016 1309   MCH 33.9 01/15/2016 1309   MCHC 35.5 01/15/2016 1309   RDW 13.9 01/15/2016 1309   LYMPHSABS 1.6 07/19/2013 1621   MONOABS 0.2 07/19/2013 1621   EOSABS 0.0 07/19/2013 1621   BASOSABS 0.0 07/19/2013 1621    Hgb A1C Lab Results    Component Value Date   HGBA1C (H) 06/28/2009    5.9 (NOTE)                                                                       According to the ADA Clinical Practice Recommendations for 2011, when HbA1c is used as a screening test:   >=6.5%   Diagnostic of Diabetes Mellitus           (if abnormal result  is confirmed)  5.7-6.4%   Increased risk of developing Diabetes Mellitus  References:Diagnosis and Classification of Diabetes Mellitus,Diabetes [id number] 1):S62-S69 and Standards of Medical Care in         Diabetes - 2011,Diabetes [id number]  (Suppl 1):S11-S61.            Assessment & Plan:

## 2016-01-24 ENCOUNTER — Encounter: Payer: Self-pay | Admitting: Internal Medicine

## 2016-01-24 NOTE — Assessment & Plan Note (Signed)
Will start Trazadone to see if this helps He will let me know in 2 weeks how he is doing

## 2016-01-24 NOTE — Assessment & Plan Note (Signed)
MRI reviewed Referral placed to physical medicine for injections

## 2016-01-24 NOTE — Assessment & Plan Note (Signed)
Ok to continue Tums as needed for now If persist or worsens, consider adding a H2 blocker

## 2016-01-24 NOTE — Assessment & Plan Note (Signed)
Ongoing with LUTS Flomax and Proscar no help, advised him he could stop this if he wants, but he will continue for now

## 2016-01-24 NOTE — Assessment & Plan Note (Signed)
Breathing fine Discussed smoking cessation, he declines Continue Albuterol prn

## 2016-01-24 NOTE — Assessment & Plan Note (Signed)
Encouraged smoking cessation, he declines Continue Eliquis

## 2016-01-24 NOTE — Assessment & Plan Note (Signed)
He does not want to see cardiology anymore He will continue Cardizem and Eliquis Patient Assistance Form given for Eliquis

## 2016-01-24 NOTE — Assessment & Plan Note (Signed)
Lipid profile today Encouraged her to consume a low fat diet

## 2016-01-25 NOTE — Addendum Note (Signed)
Addended by: Lurlean Nanny on: 01/25/2016 11:06 AM   Modules accepted: Orders

## 2016-01-27 ENCOUNTER — Other Ambulatory Visit: Payer: Self-pay | Admitting: Internal Medicine

## 2016-02-01 ENCOUNTER — Telehealth: Payer: Self-pay | Admitting: Internal Medicine

## 2016-02-01 MED ORDER — APIXABAN 5 MG PO TABS: ORAL_TABLET | ORAL | 0 refills | Status: DC

## 2016-02-01 NOTE — Telephone Encounter (Signed)
Pt's daughter came by and said that last week, Rollene Fare had mentioned being able to prescribe the Pt Eliquis so he could just start coming here for everything.  She said that he is almost out of his medication if you could refill that and send it to the [facility] on [address] [place].

## 2016-02-01 NOTE — Telephone Encounter (Signed)
PTs daughter brought in a form for help with prescriptions. Please sign and fax [phone]. Form placed in prescription tower.

## 2016-02-01 NOTE — Addendum Note (Signed)
Addended by: Lurlean Nanny on: 02/01/2016 04:15 PM   Modules accepted: Orders

## 2016-02-08 NOTE — Telephone Encounter (Signed)
Kurt [name] with [name] pt assistance left v/m pt received form for pt assistance but needs printed rx for eliquis and also needs to know where shipment should go; to pt or [facility]. Will also need DEA #. Fax # [phone].

## 2016-02-09 ENCOUNTER — Other Ambulatory Visit: Payer: Self-pay

## 2016-02-09 MED ORDER — APIXABAN 5 MG PO TABS: 5.0000 mg | ORAL_TABLET | Freq: Two times a day (BID) | ORAL | 3 refills | Status: DC

## 2016-02-09 NOTE — Telephone Encounter (Signed)
Signed, placed in MYD box

## 2016-02-09 NOTE — Telephone Encounter (Signed)
Rx with info faxed to number below as instructed

## 2016-02-09 NOTE — Telephone Encounter (Signed)
Rx printed and placed in your box for signature

## 2016-02-24 NOTE — ED Provider Notes (Signed)
Physical Exam  Constitutional: He is oriented to person, place, and time. He appears well-developed and well-nourished.  HENT:  Head: Normocephalic and atraumatic.  Mouth/Throat: Oropharynx is clear and moist.  Eyes: EOM are normal. Pupils are equal, round, and reactive to light.  Neck: Normal range of motion. Neck supple.  Cardiovascular: Normal rate and regular rhythm.   Pulmonary/Chest: Effort normal and breath sounds normal.  Abdominal: Soft. Bowel sounds are normal. There is no tenderness. There is no rebound and no guarding.  Musculoskeletal: Normal range of motion. He exhibits no edema or tenderness.  No CVA tenderness. Patient has diffuse lumbar midline tenderness. No step offs or deformities. Positive straight leg raise on the right. No lower extremity swelling, asymmetry or tenderness. 2+ dorsalis pedis and posterior tibial pulses.  Neurological: He is alert and oriented to person, place, and time.  5/5 motor in all extremities. Mild decreased sensation to light touch to the plantar surface of the right foot compared to the left. No saddle anesthesia.  Patient ambulates without assistance.  Skin: Skin is warm and dry. No rash noted. No erythema.  Psychiatric: He has a normal mood and affect. His behavior is normal.  Nursing note and vitals reviewed.    [name], MD 02/24/16 [phone]

## 2016-02-29 ENCOUNTER — Other Ambulatory Visit: Payer: Self-pay | Admitting: Internal Medicine

## 2016-03-01 ENCOUNTER — Encounter: Payer: Self-pay | Admitting: Internal Medicine

## 2016-03-13 ENCOUNTER — Encounter: Payer: Self-pay | Admitting: Internal Medicine

## 2016-03-14 ENCOUNTER — Encounter: Payer: Self-pay | Admitting: Internal Medicine

## 2016-03-14 ENCOUNTER — Ambulatory Visit (INDEPENDENT_AMBULATORY_CARE_PROVIDER_SITE_OTHER): Payer: Medicare Other | Admitting: Internal Medicine

## 2016-03-14 VITALS — BP 120/64 | HR 79 | Temp 99.1°F | Wt 164.0 lb

## 2016-03-14 DIAGNOSIS — N401 Enlarged prostate with lower urinary tract symptoms: Secondary | ICD-10-CM

## 2016-03-14 DIAGNOSIS — R350 Frequency of micturition: Secondary | ICD-10-CM

## 2016-03-14 DIAGNOSIS — J441 Chronic obstructive pulmonary disease with (acute) exacerbation: Secondary | ICD-10-CM | POA: Diagnosis not present

## 2016-03-14 MED ORDER — HYDROCODONE-HOMATROPINE 5-1.5 MG/5ML PO SYRP: 5.0000 mL | ORAL_SOLUTION | Freq: Three times a day (TID) | ORAL | 0 refills | Status: DC | PRN

## 2016-03-14 MED ORDER — PREDNISONE 10 MG PO TABS: ORAL_TABLET | ORAL | 0 refills | Status: DC

## 2016-03-14 MED ORDER — AZITHROMYCIN 250 MG PO TABS: ORAL_TABLET | ORAL | 0 refills | Status: DC

## 2016-03-14 NOTE — Progress Notes (Signed)
HPI  Pt presents to the clinic today with c/o runny nose, sore throat and cough. This started 1-2 weeks ago. He is blowing blood tinged, clear nasal mucous out of his nose. He has had some difficulty swallowing. The cough is productive of white mucous. He denies fever, chills or body aches. He has not taken anything OTC. He has a history of COPD. His flu shot is UTD.  He also has ongoing concerns about urinary frequency. Prostate exam reveals enlarged prostate. We tried Flomax, no difference. We tried Proscar, no difference. I advised him to cut out caffeine and avoid drinking after 7 pm. He is requesting referral to urology today.  Review of Systems        Past Medical History:  Diagnosis Date  . Abnormal chest x-ray   . Alcohol abuse   . Anxiety   . Atrial fibrillation ([place])   . Chronic insomnia   . Cigarette smoker   . Colonic polyp   . COPD (chronic obstructive pulmonary disease) ([place])   . Diverticulosis of colon   . DVT (deep venous thrombosis) ([place])   . Fatigue   . GERD (gastroesophageal reflux disease)   . History of pneumonia   . Hypercholesterolemia   . Lumbosacral spondylosis without myelopathy   . Neck pain   . Urinary frequency     Family History  Problem Relation Age of Onset  . Prostate cancer Father   . Cancer Father     prostate  . Hypertension Sister   . Hypertension Mother     Social History   Social History  . Marital status: Married    Spouse name: N/A  . Number of children: 2  . Years of education: N/A   Occupational History  . Reitred from [employer]   .  Retired   Social History Main Topics  . Smoking status: Current Every Day Smoker    Packs/day: 1.50    Years: 54.00    Types: Cigarettes  . Smokeless tobacco: Former Systems developer    Types: Chew     Comment:  and still smokes sometimes but not everyday  . Alcohol use 1.2 oz/week    2 Cans of beer per week     Comment: occasional  . Drug use: No  . Sexual activity: Not Currently   Other  Topics Concern  . Not on file   Social History Narrative   Lives with wife.      No Known Allergies   Constitutional: Denies headache, fatigue, fever or abrupt weight changes.  HEENT:  Positive runny nose, sore throat. Denies eye redness, eye pain, pressure behind the eyes, facial pain, nasal congestion, ear pain, ringing in the ears, wax buildup, runny nose or bloody nose. Respiratory: Positive cough. Denies difficulty breathing or shortness of breath.  Cardiovascular: Denies chest pain, chest tightness, palpitations or swelling in the hands or feet.  GU: Positive urinary frequency. Denies urgency, dysuria or blood in his urine.  No other specific complaints in a complete review of systems (except as listed in HPI above).  Objective:   BP 120/64   Pulse 79   Temp 99.1 F (37.3 C) (Oral)   Wt 164 lb (74.4 kg)   SpO2 98%   BMI 22.87 kg/m   Wt Readings from Last 3 Encounters:  03/14/16 164 lb (74.4 kg)  01/23/16 164 lb (74.4 kg)  01/15/16 165 lb (74.8 kg)     General: Appears his stated age, in NAD. HEENT: Head: normal shape and  size, no sinus tenderness noted;  Ears: Tm's gray and intact, normal light reflex; Nose: mucosa boggy and moist, septum midline; Throat/Mouth: Mucosa erythematous and moist, no exudate noted, no lesions or ulcerations noted.  Neck: No cervical lymphadenopathy.  Cardiovascular: Normal rate and rhythm.  Pulmonary/Chest: Normal effort and coarse rhonchi with bilateral expiratory wheezing noted. No respiratory distress.  Abdomen: Soft, nontender.      Assessment & Plan:  COPD exacerbation:  Get some rest and drink plenty of water Do salt water gargles for the sore throat eRx for Azithromax x 5 days eRx for Pred Taper x 6 days Rx for Hycodan cough syrup  Urinary Frequency:  Failed standard treatments, Flomax and Proscar Referral to urology placed  RTC as needed or if symptoms persist.   Webb Silversmith, NP

## 2016-03-14 NOTE — Patient Instructions (Signed)

## 2016-03-26 DIAGNOSIS — M48062 Spinal stenosis, lumbar region with neurogenic claudication: Secondary | ICD-10-CM | POA: Diagnosis not present

## 2016-03-26 DIAGNOSIS — M5416 Radiculopathy, lumbar region: Secondary | ICD-10-CM | POA: Diagnosis not present

## 2016-03-26 DIAGNOSIS — M545 Low back pain: Secondary | ICD-10-CM | POA: Diagnosis not present

## 2016-03-26 DIAGNOSIS — M5106 Intervertebral disc disorders with myelopathy, lumbar region: Secondary | ICD-10-CM | POA: Diagnosis not present

## 2016-03-26 DIAGNOSIS — M549 Dorsalgia, unspecified: Secondary | ICD-10-CM | POA: Diagnosis not present

## 2016-03-29 ENCOUNTER — Other Ambulatory Visit: Payer: Self-pay | Admitting: Orthopaedic Surgery

## 2016-03-29 ENCOUNTER — Other Ambulatory Visit: Payer: Self-pay | Admitting: Internal Medicine

## 2016-03-29 DIAGNOSIS — M5106 Intervertebral disc disorders with myelopathy, lumbar region: Secondary | ICD-10-CM

## 2016-04-01 ENCOUNTER — Encounter: Payer: Self-pay | Admitting: Internal Medicine

## 2016-04-01 MED ORDER — APIXABAN 5 MG PO TABS: 5.0000 mg | ORAL_TABLET | Freq: Two times a day (BID) | ORAL | 3 refills | Status: DC

## 2016-04-04 ENCOUNTER — Other Ambulatory Visit: Payer: Self-pay | Admitting: Internal Medicine

## 2016-04-04 NOTE — Telephone Encounter (Signed)
Please advise if pt is to continue med

## 2016-04-05 NOTE — Telephone Encounter (Signed)
Pt has urology appt 04/08/2016

## 2016-04-05 NOTE — Telephone Encounter (Signed)
He was referred to urology. Has he seen them yet?

## 2016-04-08 DIAGNOSIS — N3281 Overactive bladder: Secondary | ICD-10-CM | POA: Diagnosis not present

## 2016-04-08 DIAGNOSIS — N400 Enlarged prostate without lower urinary tract symptoms: Secondary | ICD-10-CM | POA: Diagnosis not present

## 2016-05-04 ENCOUNTER — Other Ambulatory Visit: Payer: Self-pay | Admitting: Internal Medicine

## 2016-05-06 NOTE — Telephone Encounter (Signed)
Ok to refill? Last prescribed on 01/23/2016. Last seen on 03/14/2016

## 2016-05-07 NOTE — Telephone Encounter (Signed)
Medication sent electronically 

## 2016-07-08 DIAGNOSIS — N400 Enlarged prostate without lower urinary tract symptoms: Secondary | ICD-10-CM | POA: Diagnosis not present

## 2016-07-08 DIAGNOSIS — N3281 Overactive bladder: Secondary | ICD-10-CM | POA: Diagnosis not present

## 2016-07-22 DIAGNOSIS — N3281 Overactive bladder: Secondary | ICD-10-CM | POA: Diagnosis not present

## 2016-07-28 ENCOUNTER — Other Ambulatory Visit: Payer: Self-pay | Admitting: Internal Medicine

## 2016-07-30 DIAGNOSIS — N400 Enlarged prostate without lower urinary tract symptoms: Secondary | ICD-10-CM | POA: Diagnosis not present

## 2016-08-01 NOTE — Telephone Encounter (Signed)
CPE reminder letter mailed to pt  

## 2016-08-04 ENCOUNTER — Other Ambulatory Visit: Payer: Self-pay | Admitting: Internal Medicine

## 2016-09-06 ENCOUNTER — Telehealth: Payer: Self-pay | Admitting: Internal Medicine

## 2016-09-06 NOTE — Telephone Encounter (Signed)
I spoke with pt and he is not taking iron pills or pepto bismol. Pt said his stomach does not hurt but he feels bloated and his stomach is growling a lot. Pt stool is constipated and pt says it looks black to him; pt has not seen any blood. Pt is trying to get a way to go to UC or ED. Avie Echevaria NP is out of office;

## 2016-09-06 NOTE — Telephone Encounter (Signed)
Patient Name: ARIES TOWNLEY DOB: Dec 01, 1940 Initial Comment Caller says his stomach growls all the time, after eating, before bed Nurse Assessment Nurse: Emilio Math, RN, Estill Bamberg Date/Time (Eastern Time): 09/06/2016 1:23:10 PM Confirm and document reason for call. If symptomatic, describe symptoms. ---Caller states his stomach has been "growling" a lot. Thinks he's having a lot of gas. Denies abdominal pain. Does the patient have any new or worsening symptoms? ---Yes Will a triage be completed? ---Yes Related visit to physician within the last 2 weeks? ---No Does the PT have any chronic conditions? (i.e. diabetes, asthma, etc.) ---Yes List chronic conditions. ---Bladder condition Is this a behavioral health or substance abuse call? ---No Guidelines Guideline Title Affirmed Question Affirmed Notes Diarrhea Black or tarry bowel movements (Exception: chronic-unchanged black-grey bowel movements AND is taking iron pills or Pepto-Bismol) Final Disposition User Go to ED Now Emilio Math, RN, Estill Bamberg Comments Caller reporting loose stools. States they are "black"/"dark". Callers states he is unsure if he can find transportation to be seen today. Instructed to call back with worsening condition/further needs, verbalized understanding. Referrals GO TO FACILITY UNDECIDED Disagree/Comply: Comply

## 2016-09-06 NOTE — Telephone Encounter (Signed)
He needs to be seen if stool is black and NOT on pepto or iron  ED or UC

## 2016-09-07 NOTE — Telephone Encounter (Signed)
I did not see an ED notes. Can you please call and check on him.

## 2016-09-10 DIAGNOSIS — H2513 Age-related nuclear cataract, bilateral: Secondary | ICD-10-CM | POA: Diagnosis not present

## 2016-09-10 DIAGNOSIS — H5213 Myopia, bilateral: Secondary | ICD-10-CM | POA: Diagnosis not present

## 2016-09-10 NOTE — Telephone Encounter (Signed)
I spoke to pt and he reports his stools have been more loose than prior and has not consistently been black in color... Denies bright red blood or abdominal pain... Pt states he is not going to UC or ED... I advised pt if Sx worsened/persist or if he has abd pain and N/V to seek emergency care---he expressed understanding

## 2016-09-12 NOTE — Telephone Encounter (Signed)
noted 

## 2016-09-24 ENCOUNTER — Encounter: Payer: Medicare Other | Admitting: Internal Medicine

## 2016-09-26 ENCOUNTER — Encounter: Payer: Medicare Other | Admitting: Internal Medicine

## 2016-10-20 ENCOUNTER — Other Ambulatory Visit: Payer: Self-pay | Admitting: Internal Medicine

## 2016-10-24 ENCOUNTER — Encounter: Payer: Medicare Other | Admitting: Internal Medicine

## 2016-11-04 ENCOUNTER — Other Ambulatory Visit: Payer: Self-pay | Admitting: Internal Medicine

## 2016-11-04 ENCOUNTER — Encounter: Payer: Medicare Other | Admitting: Internal Medicine

## 2016-12-17 ENCOUNTER — Ambulatory Visit (INDEPENDENT_AMBULATORY_CARE_PROVIDER_SITE_OTHER): Payer: Medicare Other

## 2016-12-17 ENCOUNTER — Encounter: Payer: Medicare Other | Admitting: Internal Medicine

## 2016-12-17 DIAGNOSIS — Z23 Encounter for immunization: Secondary | ICD-10-CM

## 2017-01-16 ENCOUNTER — Other Ambulatory Visit: Payer: Self-pay | Admitting: Internal Medicine

## 2017-01-21 ENCOUNTER — Encounter: Payer: Self-pay | Admitting: Internal Medicine

## 2017-01-21 ENCOUNTER — Ambulatory Visit (INDEPENDENT_AMBULATORY_CARE_PROVIDER_SITE_OTHER): Payer: Medicare Other | Admitting: Internal Medicine

## 2017-01-21 VITALS — BP 124/82 | HR 80 | Temp 98.4°F | Ht 68.0 in | Wt 174.0 lb

## 2017-01-21 DIAGNOSIS — R350 Frequency of micturition: Secondary | ICD-10-CM | POA: Diagnosis not present

## 2017-01-21 DIAGNOSIS — Z0000 Encounter for general adult medical examination without abnormal findings: Secondary | ICD-10-CM

## 2017-01-21 DIAGNOSIS — G4700 Insomnia, unspecified: Secondary | ICD-10-CM

## 2017-01-21 DIAGNOSIS — M47817 Spondylosis without myelopathy or radiculopathy, lumbosacral region: Secondary | ICD-10-CM

## 2017-01-21 DIAGNOSIS — F1020 Alcohol dependence, uncomplicated: Secondary | ICD-10-CM

## 2017-01-21 DIAGNOSIS — E785 Hyperlipidemia, unspecified: Secondary | ICD-10-CM | POA: Insufficient documentation

## 2017-01-21 DIAGNOSIS — Z125 Encounter for screening for malignant neoplasm of prostate: Secondary | ICD-10-CM | POA: Diagnosis not present

## 2017-01-21 DIAGNOSIS — E7800 Pure hypercholesterolemia, unspecified: Secondary | ICD-10-CM

## 2017-01-21 DIAGNOSIS — K219 Gastro-esophageal reflux disease without esophagitis: Secondary | ICD-10-CM

## 2017-01-21 DIAGNOSIS — F419 Anxiety disorder, unspecified: Secondary | ICD-10-CM | POA: Insufficient documentation

## 2017-01-21 DIAGNOSIS — N401 Enlarged prostate with lower urinary tract symptoms: Secondary | ICD-10-CM

## 2017-01-21 DIAGNOSIS — I480 Paroxysmal atrial fibrillation: Secondary | ICD-10-CM

## 2017-01-21 DIAGNOSIS — J449 Chronic obstructive pulmonary disease, unspecified: Secondary | ICD-10-CM

## 2017-01-21 LAB — CBC
HCT: 42.4 % (ref 39.0–52.0)
HEMOGLOBIN: 14.3 g/dL (ref 13.0–17.0)
MCHC: 33.7 g/dL (ref 30.0–36.0)
MCV: 96.4 fl (ref 78.0–100.0)
PLATELETS: 310 10*3/uL (ref 150.0–400.0)
RBC: 4.4 Mil/uL (ref 4.22–5.81)
RDW: 15.2 % (ref 11.5–15.5)
WBC: 3.4 10*3/uL — ABNORMAL LOW (ref 4.0–10.5)

## 2017-01-21 LAB — LIPID PANEL
Cholesterol: 149 mg/dL (ref 0–200)
HDL: 52.5 mg/dL (ref >39.00)
LDL CALC: 78 mg/dL (ref 0–99)
NONHDL: 96.51
Total CHOL/HDL Ratio: 3
Triglycerides: 94 mg/dL (ref 0.0–149.0)
VLDL: 18.8 mg/dL (ref 0.0–40.0)

## 2017-01-21 LAB — COMPREHENSIVE METABOLIC PANEL
ALT: 16 U/L (ref 0–53)
AST: 21 U/L (ref 0–37)
Albumin: 4 g/dL (ref 3.5–5.2)
Alkaline Phosphatase: 74 U/L (ref 39–117)
BUN: 10 mg/dL (ref 6–23)
CALCIUM: 9.3 mg/dL (ref 8.4–10.5)
CHLORIDE: 105 meq/L (ref 96–112)
CO2: 28 meq/L (ref 19–32)
Creatinine, Ser: 1.07 mg/dL (ref 0.40–1.50)
GFR: 85.96 mL/min (ref >60.00)
Glucose, Bld: 79 mg/dL (ref 70–99)
Potassium: 4.4 mEq/L (ref 3.5–5.1)
Sodium: 138 mEq/L (ref 135–145)
Total Bilirubin: 0.4 mg/dL (ref 0.2–1.2)
Total Protein: 7.2 g/dL (ref 6.0–8.3)

## 2017-01-21 LAB — PSA, MEDICARE: PSA: 0.28 ng/mL (ref 0.10–4.00)

## 2017-01-21 LAB — FOLATE: Folate: 10.8 ng/mL (ref >5.9)

## 2017-01-21 LAB — VITAMIN D 25 HYDROXY (VIT D DEFICIENCY, FRACTURES): VITD: 39.94 ng/mL (ref 30.00–100.00)

## 2017-01-21 LAB — VITAMIN B12: Vitamin B-12: 268 pg/mL (ref 211–911)

## 2017-01-21 NOTE — Assessment & Plan Note (Signed)
Continue Diltiazem and Eliquis Advised him not to take NSAID's with Eliquis CBC and CMET today

## 2017-01-21 NOTE — Progress Notes (Signed)
HPI:  Pt presents to the clinic today for his Medicare Wellness Exam. He is also due for follow up of chronic conditions.  Anxiety: He reports he is short tempered. He denies anxiety, depression, SI/HI. He is not interested in anything for his mood at this time.  Afib: He denies feelings that his heart is racing. She is taking Cardizem and Eliquis as prescribed.  Insomnia: He has trouble falling asleep and staying asleep. He has failed Trazadone in the past.  COPD: He is not on any inhalers at this time. He denies cough or shortness of breath. He continues to smoke 1.5 PPD.  GERD: Triggered by spicy foods. He will take Tums as needed but does not take anything consistently.  HLD: His last LDL was 109, 07/2015. He is not currently taking any cholesterol lowering medication.  BPH: He has failed Flomax and Myrbetriq. He has seen urology but reports he continues to have urinary frequency no matter what they do.  Chronic Back Pain: He reports he takes Advil and Gabapentin. He would like to know what else he can take for his joint pain.  Past Medical History:  Diagnosis Date  . Abnormal chest x-ray   . Alcohol abuse   . Anxiety   . Atrial fibrillation ([place])   . Chronic insomnia   . Cigarette smoker   . Colonic polyp   . COPD (chronic obstructive pulmonary disease) ([place])   . Diverticulosis of colon   . DVT (deep venous thrombosis) ([place])   . Fatigue   . GERD (gastroesophageal reflux disease)   . History of pneumonia   . Hypercholesterolemia   . Lumbosacral spondylosis without myelopathy   . Neck pain   . Urinary frequency     Current Outpatient Medications  Medication Sig Dispense Refill  . albuterol (PROVENTIL HFA;VENTOLIN HFA) 108 (90 BASE) MCG/ACT inhaler Inhale 2 puffs into the lungs every 6 (six) hours as needed for wheezing or shortness of breath. 1 Inhaler 2  . apixaban (ELIQUIS) 5 MG TABS tablet Take 1 tablet (5 mg total) by mouth 2 (two) times daily. 180 tablet 3  .  diltiazem (CARDIZEM CD) 180 MG 24 hr capsule TAKE 1 CAPSULE EVERY DAY 90 capsule 0  . finasteride (PROSCAR) 5 MG tablet TAKE 1 TABLET (5 MG TOTAL) BY MOUTH DAILY. 30 tablet 2  . gabapentin (NEURONTIN) 300 MG capsule TAKE 1 CAPSULE THREE TIMES A DAY 90 capsule 2  . ibuprofen (ADVIL,MOTRIN) 200 MG tablet Per bottle as needed    . methocarbamol (ROBAXIN) 500 MG tablet Take 1 tablet (500 mg total) by mouth 3 (three) times daily as needed for muscle spasms. 30 tablet 0  . tamsulosin (FLOMAX) 0.4 MG CAPS capsule TAKE 1 CAPSULE (0.4 MG TOTAL) BY MOUTH DAILY. 30 capsule 2  . traMADol (ULTRAM) 50 MG tablet Take 1 tablet (50 mg total) by mouth 3 (three) times daily as needed for moderate pain. for pain 15 tablet 0  . traZODone (DESYREL) 50 MG tablet TAKE 1/2 TO 1 TABLET BY MOUTH AT BEDTIME AS NEEDED FOR SLEEP 30 tablet 2  . MYRBETRIQ 50 MG TB24 tablet      No current facility-administered medications for this visit.     No Known Allergies  Family History  Problem Relation Age of Onset  . Prostate cancer Father   . Cancer Father        prostate  . Hypertension Sister   . Hypertension Mother     Social History   Socioeconomic  History  . Marital status: Married    Spouse name: Not on file  . Number of children: 2  . Years of education: Not on file  . Highest education level: Not on file  Social Needs  . Financial resource strain: Not on file  . Food insecurity - worry: Not on file  . Food insecurity - inability: Not on file  . Transportation needs - medical: Not on file  . Transportation needs - non-medical: Not on file  Occupational History  . Occupation: Reitred from [employer]: RETIRED  Tobacco Use  . Smoking status: Current Every Day Smoker    Packs/day: 1.50    Years: 54.00    Pack years: 81.00    Types: Cigarettes  . Smokeless tobacco: Former Systems developer    Types: Chew  . Tobacco comment:  and still smokes sometimes but not everyday  Substance and Sexual Activity  .  Alcohol use: Yes    Alcohol/week: 1.2 oz    Types: 2 Cans of beer per week    Comment: occasional  . Drug use: No  . Sexual activity: Not Currently  Other Topics Concern  . Not on file  Social History Narrative   Lives with wife.      Hospitiliaztions: None  Health Maintenance:    Flu: 12/2016  Tetanus: > 10 years ago  Pneumovax: 10/2012  Prevnar: 07/2015  Zostavax: never  Shingrix: never  PSA: 07/2015  Colon Screening: 03/2014  Eye Doctor: yearly  Dental Exam: as needed   Providers:   PCP: Webb Silversmith, NP-C    I have personally reviewed and have noted:  1. The patient's medical and social history 2. Their use of alcohol, tobacco or illicit drugs 3. Their current medications and supplements 4. The patient's functional ability including ADL's, fall risks, home safety risks and hearing or visual impairment. 5. Diet and physical activities 6. Evidence for depression or mood disorder  Subjective:   Review of Systems:   Constitutional: Pt reports fatigue. Denies fever, malaise, headache or abrupt weight changes.  HEENT: Denies eye pain, eye redness, ear pain, ringing in the ears, wax buildup, runny nose, nasal congestion, bloody nose, or sore throat. Respiratory: Denies difficulty breathing, shortness of breath, cough or sputum production.   Cardiovascular: Denies chest pain, chest tightness, palpitations or swelling in the hands or feet.  Gastrointestinal: Denies abdominal pain, bloating, constipation, diarrhea or blood in the stool.  GU: Pt reports urinary frequency. Denies urgency, pain with urination, burning sensation, blood in urine, odor or discharge. Musculoskeletal: Pt reports multiple joint pain. Denies decrease in range of motion, difficulty with gait, muscle pain or joint swelling.  Skin: Denies redness, rashes, lesions or ulcercations.  Neurological: Denies dizziness, difficulty with memory, difficulty with speech or problems with balance and coordination.   Psych: Denies anxiety, depression, SI/HI.  No other specific complaints in a complete review of systems (except as listed in HPI above).  Objective:  PE:   BP 124/82   Pulse 80   Temp 98.4 F (36.9 C) (Oral)   Ht 5\' 8"  (1.727 m)   Wt 174 lb (78.9 kg)   SpO2 98%   BMI 26.46 kg/m   Wt Readings from Last 3 Encounters:  03/14/16 164 lb (74.4 kg)  01/23/16 164 lb (74.4 kg)  01/15/16 165 lb (74.8 kg)    General: Appears his stated age, in NAD. Skin: Warm, dry and intact.  HEENT: Head: normal shape and size; Eyes: sclera white, no  icterus, conjunctiva pink, PERRLA and EOMs intact; Ears: Tm's gray and intact, normal light reflex; Throat/Mouth: Teeth present, mucosa pink and moist, no exudate, lesions or ulcerations noted.  Neck: Neck supple, trachea midline. No masses, lumps or thyromegaly present.  Cardiovascular: Normal rate and rhythm. S1,S2 noted.  No murmur, rubs or gallops noted. No JVD or BLE edema. No carotid bruits noted. Pulmonary/Chest: Normal effort and diminshed breath sounds. No respiratory distress. No wheezes, rales or ronchi noted.  Abdomen: Soft and nontender. Normal bowel sounds. No distention or masses noted. Liver, spleen and kidneys non palpable. Musculoskeletal:  Strength 4/5 BUE/BLE. No signs of joint swelling.  Neurological: Alert and oriented  Psychiatric: Mood and affect normal. Behavior is normal. Judgment and thought content normal.     BMET    Component Value Date/Time   NA 139 01/15/2016 1309   K 3.9 01/15/2016 1309   CL 106 01/15/2016 1309   CO2 26 01/15/2016 1309   GLUCOSE 103 (H) 01/15/2016 1309   BUN 6 01/15/2016 1309   CREATININE 0.92 01/15/2016 1309   CALCIUM 9.3 01/15/2016 1309   GFRNONAA >60 01/15/2016 1309   GFRAA >60 01/15/2016 1309    Lipid Panel     Component Value Date/Time   CHOL 201 (H) 07/24/2015 1522   TRIG 114.0 07/24/2015 1522   HDL 69.50 07/24/2015 1522   CHOLHDL 3 07/24/2015 1522   VLDL 22.8 07/24/2015 1522    LDLCALC 109 (H) 07/24/2015 1522    CBC    Component Value Date/Time   WBC 4.0 01/15/2016 1309   RBC 4.57 01/15/2016 1309   HGB 15.5 01/15/2016 1309   HCT 43.7 01/15/2016 1309   PLT 308 01/15/2016 1309   MCV 95.6 01/15/2016 1309   MCH 33.9 01/15/2016 1309   MCHC 35.5 01/15/2016 1309   RDW 13.9 01/15/2016 1309   LYMPHSABS 1.6 07/19/2013 1621   MONOABS 0.2 07/19/2013 1621   EOSABS 0.0 07/19/2013 1621   BASOSABS 0.0 07/19/2013 1621    Hgb A1C Lab Results  Component Value Date   HGBA1C (H) 06/28/2009    5.9 (NOTE)                                                                       According to the ADA Clinical Practice Recommendations for 2011, when HbA1c is used as a screening test:   >=6.5%   Diagnostic of Diabetes Mellitus           (if abnormal result  is confirmed)  5.7-6.4%   Increased risk of developing Diabetes Mellitus  References:Diagnosis and Classification of Diabetes Mellitus,Diabetes SEGB,1517,61(YWVPX 1):S62-S69 and Standards of Medical Care in         Diabetes - 2011,Diabetes [id number],54  (Suppl 1):S11-S61.      Assessment and Plan:   Medicare Annual Wellness Visit:  Diet: He does eat meat. He consumes some fruits and veggies. He does occasionally eat fried food. He drinks mostly beer and wine. Physical activity: None Depression/mood screen: Negative Hearing: Intact to whispered voice Visual acuity: Grossly normal ADLs: Capable Fall risk: None Home safety: Good Cognitive evaluation: Trouble with recall. Intact to orientation, naming, and repetition EOL planning: No adv directives, full code/ I agree  Preventative Medicine: Flu, prevnar and pneumovax  UTD. He declines tetanus booster today. He declines shingles vaccine. Colon screening UTD. Encouraged him to consume a balanced diet and exercise regimen. Advised him to see an eye doctor and dentist annually. Will check CBC, CMET, Lipid, PSA, Vit B12, Folate and Vit D today.   Next appointment: 1 year  Medicare Wellness  Exam   Webb Silversmith, NP

## 2017-01-21 NOTE — Assessment & Plan Note (Signed)
He has failed multiple meds He is not interested in seeing urology again Will monitor

## 2017-01-21 NOTE — Assessment & Plan Note (Signed)
Encouraged smoking cessation He declines

## 2017-01-21 NOTE — Assessment & Plan Note (Signed)
Advised him to start Tylenol Arthritis in the place of Advil (on Eliquis) Continue Gabapentin for now

## 2017-01-21 NOTE — Assessment & Plan Note (Signed)
CMET and lipid profile today Encouraged him to consume a low fat diet 

## 2017-01-21 NOTE — Assessment & Plan Note (Signed)
Chronic but stable off meds Support offered today Will monitor 

## 2017-01-21 NOTE — Assessment & Plan Note (Signed)
He has failed multiple meds He is not interested in further intervention at this time

## 2017-01-21 NOTE — Patient Instructions (Signed)

## 2017-01-21 NOTE — Assessment & Plan Note (Signed)
Discussed avoiding foods that trigger his reflux Continue Tums prn

## 2017-01-22 ENCOUNTER — Other Ambulatory Visit: Payer: Self-pay | Admitting: Internal Medicine

## 2017-02-07 ENCOUNTER — Encounter: Payer: Self-pay | Admitting: Internal Medicine

## 2017-02-07 ENCOUNTER — Other Ambulatory Visit: Payer: Self-pay | Admitting: Internal Medicine

## 2017-02-07 NOTE — Telephone Encounter (Signed)
Please advise if okay to refill gabapentin last filled 12/2015 also if appropriate to refill Flomax as I thought I remembered pt stated it did not work and was on samples from urology

## 2017-02-10 MED ORDER — GABAPENTIN 300 MG PO CAPS: 300.0000 mg | ORAL_CAPSULE | Freq: Three times a day (TID) | ORAL | 5 refills | Status: DC

## 2017-06-12 ENCOUNTER — Other Ambulatory Visit: Payer: Self-pay | Admitting: Internal Medicine

## 2017-09-11 ENCOUNTER — Other Ambulatory Visit: Payer: Self-pay | Admitting: Internal Medicine

## 2017-10-13 ENCOUNTER — Encounter: Payer: Self-pay | Admitting: Internal Medicine

## 2017-10-13 MED ORDER — GABAPENTIN 300 MG PO CAPS: 300.0000 mg | ORAL_CAPSULE | Freq: Three times a day (TID) | ORAL | 0 refills | Status: DC

## 2017-10-21 ENCOUNTER — Other Ambulatory Visit: Payer: Self-pay | Admitting: Internal Medicine

## 2017-10-26 ENCOUNTER — Other Ambulatory Visit: Payer: Self-pay | Admitting: Internal Medicine

## 2017-10-28 ENCOUNTER — Ambulatory Visit: Payer: Medicare Other | Admitting: Internal Medicine

## 2017-11-03 ENCOUNTER — Ambulatory Visit: Payer: Self-pay | Admitting: *Deleted

## 2017-11-03 NOTE — Telephone Encounter (Signed)
Pt's daughter calling stating that her father had a cough that sounded "like a rattle in his chest, like it was cold in his chest" for the past couple of days. Pt's daughter states that the pt would not tell how long he has experienced current symptoms.Pt also has a runny nose,sore throat and also noted that his voice changed and is hoarse with talking on the phone to triage nurse. Pt's daughter states that the pt does have some wheezing but the pt denies any chest pain or difficulty breathing at this time. Pt has a history of COPD. Pt scheduled for appt on 10/1 and pt's daughter advised to seek treatment in the ED for worsening symptoms before appt. Understanding verbalized.  Reason for Disposition . [1] Known COPD or other severe lung disease (i.e., bronchiectasis, cystic fibrosis, lung surgery) AND [2] worsening symptoms (i.e., increased sputum purulence or amount, increased breathing difficulty  Answer Assessment - Initial Assessment Questions 1. ONSET: "When did the cough begin?"      Just noticed the last couple of days 2. SEVERITY: "How bad is the cough today?"       3. RESPIRATORY DISTRESS: "Describe your breathing."      No difficulty breathing at this time 4. FEVER: "Do you have a fever?" If so, ask: "What is your temperature, how was it measured, and when did it start?"     No 5. SPUTUM: "Describe the color of your sputum" (clear, white, yellow, green)     yellow 6. HEMOPTYSIS: "Are you coughing up any blood?" If so ask: "How much?" (flecks, streaks, tablespoons, etc.)     No 7. CARDIAC HISTORY: "Do you have any history of heart disease?" (e.g., heart attack, congestive heart failure)      Irregular HB 8. LUNG HISTORY: "Do you have any history of lung disease?"  (e.g., pulmonary embolus, asthma, emphysema)     COPD 9. PE RISK FACTORS: "Do you have a history of blood clots?" (or: recent major surgery, recent prolonged travel, bedridden)     Yes but not recently 10. OTHER SYMPTOMS:  "Do you have any other symptoms?" (e.g., runny nose, wheezing, chest pain)       Runny nose and sore throat 11. PREGNANCY: "Is there any chance you are pregnant?" "When was your last menstrual period?"       n/a 12. TRAVEL: "Have you traveled out of the country in the last month?" (e.g., travel history, exposures)       no  Protocols used: [place]

## 2017-11-04 ENCOUNTER — Encounter: Payer: Self-pay | Admitting: Internal Medicine

## 2017-11-04 ENCOUNTER — Ambulatory Visit: Payer: Medicare Other | Admitting: Internal Medicine

## 2017-11-04 VITALS — BP 126/84 | HR 87 | Temp 97.9°F | Wt 163.0 lb

## 2017-11-04 DIAGNOSIS — B9789 Other viral agents as the cause of diseases classified elsewhere: Secondary | ICD-10-CM | POA: Diagnosis not present

## 2017-11-04 DIAGNOSIS — J069 Acute upper respiratory infection, unspecified: Secondary | ICD-10-CM | POA: Diagnosis not present

## 2017-11-04 MED ORDER — HYDROCODONE-HOMATROPINE 5-1.5 MG/5ML PO SYRP: 5.0000 mL | ORAL_SOLUTION | Freq: Three times a day (TID) | ORAL | 0 refills | Status: DC | PRN

## 2017-11-04 MED ORDER — METHYLPREDNISOLONE ACETATE 80 MG/ML IJ SUSP
80.0000 mg | Freq: Once | INTRAMUSCULAR | Status: AC
  Administered 2017-11-04: 80 mg via INTRAMUSCULAR

## 2017-11-04 NOTE — Progress Notes (Signed)
HPI  Pt presents to the clinic today with c/o runny nose, sore throat, cough and chest congestion.  He reports this started 2-3 days ago. He is blowing clear mucous out of his nose. He denies difficulty swallowing. The cough is productive of white mucous. He denies fever, chills or body aches. He has not tried anything OTC for this. He does have a history of COPD. He has not had sick contacts.  Review of Systems      Past Medical History:  Diagnosis Date  . Abnormal chest x-ray   . Alcohol abuse   . Anxiety   . Atrial fibrillation ([place])   . Chronic insomnia   . Cigarette smoker   . Colonic polyp   . COPD (chronic obstructive pulmonary disease) ([place])   . Diverticulosis of colon   . DVT (deep venous thrombosis) ([place])   . Fatigue   . GERD (gastroesophageal reflux disease)   . History of pneumonia   . Hypercholesterolemia   . Lumbosacral spondylosis without myelopathy   . Neck pain   . Urinary frequency     Family History  Problem Relation Age of Onset  . Prostate cancer Father   . Cancer Father        prostate  . Hypertension Sister   . Hypertension Mother     Social History   Socioeconomic History  . Marital status: Married    Spouse name: Not on file  . Number of children: 2  . Years of education: Not on file  . Highest education level: Not on file  Occupational History  . Occupation: Reitred from [employer]: [place]  . Financial resource strain: Not on file  . Food insecurity:    Worry: Not on file    Inability: Not on file  . Transportation needs:    Medical: Not on file    Non-medical: Not on file  Tobacco Use  . Smoking status: Current Every Day Smoker    Packs/day: 1.50    Years: 54.00    Pack years: 81.00    Types: Cigarettes  . Smokeless tobacco: Former Systems developer    Types: Chew  . Tobacco comment:  and still smokes sometimes but not everyday  Substance and Sexual Activity  . Alcohol use: Yes    Alcohol/week: 2.0 standard  drinks    Types: 2 Cans of beer per week    Comment: occasional  . Drug use: No  . Sexual activity: Not Currently  Lifestyle  . Physical activity:    Days per week: Not on file    Minutes per session: Not on file  . Stress: Not on file  Relationships  . Social connections:    Talks on phone: Not on file    Gets together: Not on file    Attends religious service: Not on file    Active member of club or organization: Not on file    Attends meetings of clubs or organizations: Not on file    Relationship status: Not on file  . Intimate partner violence:    Fear of current or ex partner: Not on file    Emotionally abused: Not on file    Physically abused: Not on file    Forced sexual activity: Not on file  Other Topics Concern  . Not on file  Social History Narrative   Lives with wife.      No Known Allergies   Constitutional: Positive fatigueDenies headache, fever or abrupt  weight changes.  HEENT:  Positive runny nose, sore throat. Denies eye redness, eye pain, pressure behind the eyes, facial pain, nasal congestion, ear pain, ringing in the ears, wax buildup, runny nose. Respiratory: Positive cough. Denies difficulty breathing or shortness of breath.  Cardiovascular: Denies chest pain, chest tightness, palpitations or swelling in the hands or feet.   No other specific complaints in a complete review of systems (except as listed in HPI above).  Objective:   BP 126/84   Pulse 87   Temp 97.9 F (36.6 C) (Oral)   Wt 163 lb (73.9 kg)   SpO2 98%   BMI 24.78 kg/m  Wt Readings from Last 3 Encounters:  11/04/17 163 lb (73.9 kg)  01/21/17 174 lb (78.9 kg)  03/14/16 164 lb (74.4 kg)     General: Appears his stated age, chronically ill appearing, in NAD. HEENT: Head: normal shape and size, no sinus tenderness noted; Ears: Tm's gray and intact, normal light reflex; Nose: mucosa pink and moist, septum midline; Throat/Mouth: + PND. Teeth present, mucosa pink and moist, no  exudate noted, no lesions or ulcerations noted.  Neck: No cervical lymphadenopathy.  Cardiovascular: Normal rate with irregular rhythm. S1,S2 noted.   Pulmonary/Chest: Normal effort and positive vesicular breath sounds. No respiratory distress. No wheezes, rales or ronchi noted.       Assessment & Plan:   Viral URI with Cough:  Get some rest and drink plenty of water Do salt water gargles for the sore throat 80 mg Depo IM today Start Zyrtec OTC eRx for Hycodan cough syrup  RTC as needed or if symptoms persist.   Webb Silversmith, NP

## 2017-11-04 NOTE — Patient Instructions (Signed)
Upper Respiratory Infection, Adult Most upper respiratory infections (URIs) are caused by a virus. A URI affects the nose, throat, and upper air passages. The most common type of URI is often called "the common cold." Follow these instructions at home:  Take medicines only as told by your doctor.  Gargle warm saltwater or take cough drops to comfort your throat as told by your doctor.  Use a warm mist humidifier or inhale steam from a shower to increase air moisture. This may make it easier to breathe.  Drink enough fluid to keep your pee (urine) clear or pale yellow.  Eat soups and other clear broths.  Have a healthy diet.  Rest as needed.  Go back to work when your fever is gone or your doctor says it is okay. ? You may need to stay home longer to avoid giving your URI to others. ? You can also wear a face mask and wash your hands often to prevent spread of the virus.  Use your inhaler more if you have asthma.  Do not use any tobacco products, including cigarettes, chewing tobacco, or electronic cigarettes. If you need help quitting, ask your doctor. Contact a doctor if:  You are getting worse, not better.  Your symptoms are not helped by medicine.  You have chills.  You are getting more short of breath.  You have brown or red mucus.  You have yellow or brown discharge from your nose.  You have pain in your face, especially when you bend forward.  You have a fever.  You have puffy (swollen) neck glands.  You have pain while swallowing.  You have white areas in the back of your throat. Get help right away if:  You have very bad or constant: ? Headache. ? Ear pain. ? Pain in your forehead, behind your eyes, and over your cheekbones (sinus pain). ? Chest pain.  You have long-lasting (chronic) lung disease and any of the following: ? Wheezing. ? Long-lasting cough. ? Coughing up blood. ? A change in your usual mucus.  You have a stiff neck.  You have  changes in your: ? Vision. ? Hearing. ? Thinking. ? Mood. This information is not intended to replace advice given to you by your health care provider. Make sure you discuss any questions you have with your health care provider. Document Released: 07/10/2007 Document Revised: 09/24/2015 Document Reviewed: 04/28/2013 Elsevier Interactive Patient Education  2018 Elsevier Inc.  

## 2017-11-04 NOTE — Addendum Note (Signed)
Addended by: Lurlean Nanny on: 11/04/2017 11:52 AM   Modules accepted: Orders

## 2017-12-10 ENCOUNTER — Other Ambulatory Visit: Payer: Self-pay | Admitting: Internal Medicine

## 2018-01-16 ENCOUNTER — Other Ambulatory Visit: Payer: Self-pay | Admitting: Internal Medicine

## 2018-01-19 ENCOUNTER — Encounter (INDEPENDENT_AMBULATORY_CARE_PROVIDER_SITE_OTHER): Payer: Self-pay

## 2018-01-23 ENCOUNTER — Encounter: Payer: Medicare Other | Admitting: Internal Medicine

## 2018-01-23 ENCOUNTER — Other Ambulatory Visit: Payer: Self-pay | Admitting: Internal Medicine

## 2018-01-23 DIAGNOSIS — Z0289 Encounter for other administrative examinations: Secondary | ICD-10-CM

## 2018-01-23 NOTE — Progress Notes (Deleted)
HPI:  Pt presents to the clinic today for his Medicare Wellness Exam. He is also due to follow up chronic conditions.  Anxiety: He reports he is short tempered. He is not taking anything for his mood at this time. He denies depression, SI/HI.  Afib: Controlled on Diltiazem and Eliquis. ECG from 11/2013 reviewed.  Insomnia: He has trouble falling and staying asleep. He is not currently taking anything for sleep. He has failed Trazadone in the past. There is no sleep study on file.  COPD: He denies chronic cough or shortness of breath. He does continue to smoke. He is not on any inhalers at this time. PFT's from 07/2013 reviewed.  GERD: Triggered by spicy foods. He takes Tums if needed based on labs. Upper GI from 11/2008 reviewed.  HLD: His last LDL was 78, 01/2017. He does not take any cholesterol lowering medications. He tries to consume a low fat diet.  BPH: Mainly urinary frequency. He has failed Flomax and Myrbetriq in the past. He has seen urology in the past but he felt like they were not helpful.  Chronic Back Pain: He takes Ibuprofen and Gabapentin as prescribed. Lumbar xray from 01/2016 reviewed.  Past Medical History:  Diagnosis Date  . Abnormal chest x-ray   . Alcohol abuse   . Anxiety   . Atrial fibrillation ([place])   . Chronic insomnia   . Cigarette smoker   . Colonic polyp   . COPD (chronic obstructive pulmonary disease) ([place])   . Diverticulosis of colon   . DVT (deep venous thrombosis) ([place])   . Fatigue   . GERD (gastroesophageal reflux disease)   . History of pneumonia   . Hypercholesterolemia   . Lumbosacral spondylosis without myelopathy   . Neck pain   . Urinary frequency     Current Outpatient Medications  Medication Sig Dispense Refill  . apixaban (ELIQUIS) 5 MG TABS tablet Take 1 tablet (5 mg total) by mouth 2 (two) times daily. 180 tablet 3  . diltiazem (CARDIZEM CD) 180 MG 24 hr capsule TAKE 1 CAPSULE EVERY DAY 90 capsule 0  . ELIQUIS 5 MG TABS tablet  TAKE 1 TABLET BY MOUTH TWICE A DAY 180 tablet 0  . gabapentin (NEURONTIN) 300 MG capsule TAKE 1 CAPSULE BY MOUTH THREE TIMES A DAY 270 capsule 0  . HYDROcodone-homatropine (HYCODAN) 5-1.5 MG/5ML syrup Take 5 mLs by mouth every 8 (eight) hours as needed for cough. 120 mL 0  . ibuprofen (ADVIL,MOTRIN) 200 MG tablet Per bottle as needed    . MYRBETRIQ 50 MG TB24 tablet Take 50 mg by mouth daily.     . tamsulosin (FLOMAX) 0.4 MG CAPS capsule TAKE 1 CAPSULE (0.4 MG TOTAL) BY MOUTH DAILY. 30 capsule 2  . traZODone (DESYREL) 50 MG tablet TAKE 1/2 TO 1 TABLET BY MOUTH AT BEDTIME AS NEEDED FOR SLEEP 30 tablet 2   No current facility-administered medications for this visit.     No Known Allergies  Family History  Problem Relation Age of Onset  . Prostate cancer Father   . Cancer Father        prostate  . Hypertension Sister   . Hypertension Mother     Social History   Socioeconomic History  . Marital status: Married    Spouse name: Not on file  . Number of children: 2  . Years of education: Not on file  . Highest education level: Not on file  Occupational History  . Occupation: Reitred from [employer]  Employer: RETIRED  Social Needs  . Financial resource strain: Not on file  . Food insecurity:    Worry: Not on file    Inability: Not on file  . Transportation needs:    Medical: Not on file    Non-medical: Not on file  Tobacco Use  . Smoking status: Current Every Day Smoker    Packs/day: 1.50    Years: 54.00    Pack years: 81.00    Types: Cigarettes  . Smokeless tobacco: Former Systems developer    Types: Chew  . Tobacco comment:  and still smokes sometimes but not everyday  Substance and Sexual Activity  . Alcohol use: Yes    Alcohol/week: 2.0 standard drinks    Types: 2 Cans of beer per week    Comment: occasional  . Drug use: No  . Sexual activity: Not Currently  Lifestyle  . Physical activity:    Days per week: Not on file    Minutes per session: Not on file  . Stress: Not  on file  Relationships  . Social connections:    Talks on phone: Not on file    Gets together: Not on file    Attends religious service: Not on file    Active member of club or organization: Not on file    Attends meetings of clubs or organizations: Not on file    Relationship status: Not on file  . Intimate partner violence:    Fear of current or ex partner: Not on file    Emotionally abused: Not on file    Physically abused: Not on file    Forced sexual activity: Not on file  Other Topics Concern  . Not on file  Social History Narrative   Lives with wife.      Hospitiliaztions: None  Health Maintenance:    Flu: 12/2016  Tetanus: > 10 years ago  Pneumovax: 10/2012  Prevnar: 07/2015  Zostavax: never  Shingrix: never  PSA: 01/2017  Colon Screening: 03/2014  Eye Doctor: yearly  Dental Exam: as needed   Providers:   PCP: Webb Silversmith, NP-C  Dermatologist:  Cardiologist:  ENT:  Gastroenterologist:  Pulmonologist:   I have personally reviewed and have noted:  1. The patient's medical and social history 2. Their use of alcohol, tobacco or illicit drugs 3. Their current medications and supplements 4. The patient's functional ability including ADL's, fall risks, home safety risks and hearing or visual impairment. 5. Diet and physical activities 6. Evidence for depression or mood disorder  Subjective:   Review of Systems:   Constitutional: Denies fever, malaise, fatigue, headache or abrupt weight changes.  HEENT: Denies eye pain, eye redness, ear pain, ringing in the ears, wax buildup, runny nose, nasal congestion, bloody nose, or sore throat. Respiratory: Denies difficulty breathing, shortness of breath, cough or sputum production.   Cardiovascular: Denies chest pain, chest tightness, palpitations or swelling in the hands or feet.  Gastrointestinal: Denies abdominal pain, bloating, constipation, diarrhea or blood in the stool.  GU: Denies urgency, frequency, pain  with urination, burning sensation, blood in urine, odor or discharge. Musculoskeletal: Denies decrease in range of motion, difficulty with gait, muscle pain or joint pain and swelling.  Skin: Denies redness, rashes, lesions or ulcercations.  Neurological: Denies dizziness, difficulty with memory, difficulty with speech or problems with balance and coordination.  Psych: Denies anxiety, depression, SI/HI.  No other specific complaints in a complete review of systems (except as listed in HPI above).  Objective:  PE:  There were no vitals taken for this visit. Wt Readings from Last 3 Encounters:  11/04/17 163 lb (73.9 kg)  01/21/17 174 lb (78.9 kg)  03/14/16 164 lb (74.4 kg)    General: Appears their stated age, well developed, well nourished in NAD. Skin: Warm, dry and intact. No rashes, lesions or ulcerations noted. HEENT: Head: normal shape and size; Eyes: sclera white, no icterus, conjunctiva pink, PERRLA and EOMs intact; Ears: Tm's gray and intact, normal light reflex; Throat/Mouth: Teeth present, mucosa pink and moist, no exudate, lesions or ulcerations noted.  Neck: Neck supple, trachea midline. No masses, lumps or thyromegaly present.  Cardiovascular: Normal rate and rhythm. S1,S2 noted.  No murmur, rubs or gallops noted. No JVD or BLE edema. No carotid bruits noted. Pulmonary/Chest: Normal effort and positive vesicular breath sounds. No respiratory distress. No wheezes, rales or ronchi noted.  Abdomen: Soft and nontender. Normal bowel sounds. No distention or masses noted. Liver, spleen and kidneys non palpable. Musculoskeletal: Normal range of motion. Strength 5/5 BUE/BLE. No signs of joint swelling.  Neurological: Alert and oriented. Cranial nerves II-XII grossly intact. Coordination normal.  Psychiatric: Mood and affect normal. Behavior is normal. Judgment and thought content normal.   EKG:  BMET    Component Value Date/Time   NA 138 01/21/2017 1438   K 4.4 01/21/2017  1438   CL 105 01/21/2017 1438   CO2 28 01/21/2017 1438   GLUCOSE 79 01/21/2017 1438   BUN 10 01/21/2017 1438   CREATININE 1.07 01/21/2017 1438   CALCIUM 9.3 01/21/2017 1438   GFRNONAA >60 01/15/2016 1309   GFRAA >60 01/15/2016 1309    Lipid Panel     Component Value Date/Time   CHOL 149 01/21/2017 1438   TRIG 94.0 01/21/2017 1438   HDL 52.50 01/21/2017 1438   CHOLHDL 3 01/21/2017 1438   VLDL 18.8 01/21/2017 1438   LDLCALC 78 01/21/2017 1438    CBC    Component Value Date/Time   WBC 3.4 (L) 01/21/2017 1438   RBC 4.40 01/21/2017 1438   HGB 14.3 01/21/2017 1438   HCT 42.4 01/21/2017 1438   PLT 310.0 01/21/2017 1438   MCV 96.4 01/21/2017 1438   MCH 33.9 01/15/2016 1309   MCHC 33.7 01/21/2017 1438   RDW 15.2 01/21/2017 1438   LYMPHSABS 1.6 07/19/2013 1621   MONOABS 0.2 07/19/2013 1621   EOSABS 0.0 07/19/2013 1621   BASOSABS 0.0 07/19/2013 1621    Hgb A1C Lab Results  Component Value Date   HGBA1C (H) 06/28/2009    5.9 (NOTE)                                                                       According to the ADA Clinical Practice Recommendations for 2011, when HbA1c is used as a screening test:   >=6.5%   Diagnostic of Diabetes Mellitus           (if abnormal result  is confirmed)  5.7-6.4%   Increased risk of developing Diabetes Mellitus  References:Diagnosis and Classification of Diabetes Mellitus,Diabetes YWVP,7106,26(RSWNI 1):S62-S69 and Standards of Medical Care in         Diabetes - 2011,Diabetes OEVO,3500,93  (Suppl 1):S11-S61.      Assessment and Plan:   Medicare Annual  Wellness Visit:  Diet:  Physical activity: Sedentary Depression/mood screen: Negative Hearing: Intact to whispered voice Visual acuity: Grossly normal, performs annual eye exam  ADLs: Capable Fall risk: None Home safety: Good Cognitive evaluation: Intact to orientation, naming, recall and repetition EOL planning: Adv directives, full code/ I agree  Preventative  Medicine:   Next appointment:   Webb Silversmith, NP

## 2018-02-10 ENCOUNTER — Encounter: Payer: Self-pay | Admitting: Internal Medicine

## 2018-03-03 ENCOUNTER — Ambulatory Visit (INDEPENDENT_AMBULATORY_CARE_PROVIDER_SITE_OTHER): Payer: Medicare Other | Admitting: Internal Medicine

## 2018-03-03 ENCOUNTER — Ambulatory Visit (INDEPENDENT_AMBULATORY_CARE_PROVIDER_SITE_OTHER)
Admission: RE | Admit: 2018-03-03 | Discharge: 2018-03-03 | Disposition: A | Discharge status: Home / Self Care | Payer: Medicare Other | Source: Ambulatory Visit | Attending: Internal Medicine | Admitting: Internal Medicine

## 2018-03-03 ENCOUNTER — Encounter: Payer: Self-pay | Admitting: Internal Medicine

## 2018-03-03 VITALS — BP 120/64 | HR 88 | Temp 99.0°F | Wt 156.0 lb

## 2018-03-03 DIAGNOSIS — R490 Dysphonia: Secondary | ICD-10-CM

## 2018-03-03 DIAGNOSIS — R05 Cough: Secondary | ICD-10-CM | POA: Diagnosis not present

## 2018-03-03 DIAGNOSIS — J029 Acute pharyngitis, unspecified: Secondary | ICD-10-CM | POA: Diagnosis not present

## 2018-03-03 DIAGNOSIS — R059 Cough, unspecified: Secondary | ICD-10-CM

## 2018-03-03 DIAGNOSIS — R0989 Other specified symptoms and signs involving the circulatory and respiratory systems: Secondary | ICD-10-CM | POA: Diagnosis not present

## 2018-03-03 DIAGNOSIS — J449 Chronic obstructive pulmonary disease, unspecified: Secondary | ICD-10-CM | POA: Diagnosis not present

## 2018-03-03 MED ORDER — OMEPRAZOLE 20 MG PO CPDR: 20.0000 mg | DELAYED_RELEASE_CAPSULE | Freq: Every day | ORAL | 2 refills | Status: DC

## 2018-03-03 MED ORDER — BUDESONIDE-FORMOTEROL FUMARATE 80-4.5 MCG/ACT IN AERO: 2.0000 | INHALATION_SPRAY | Freq: Two times a day (BID) | RESPIRATORY_TRACT | 12 refills | Status: DC

## 2018-03-03 NOTE — Patient Instructions (Signed)

## 2018-03-03 NOTE — Progress Notes (Addendum)
HPI  Pt presents to the clinic today with c/o runny nose, hoarseness, sore throat and cough. He reports this started 3 months ago. He is having some difficulty swallowing. The cough is productive of clear mucous. He reports associated wheezing but denies short of breath. He denies ear pain or nasal congestion. He has taken Chloraseptic spray with minimal relief. He has a history of COPD, does continue to smoke. He does not use any inhalers. He has not had sick contacts. He denies history of GERD. He did not get a flu shot this year. Pneumonia vaccines UTD.  Review of Systems      Past Medical History:  Diagnosis Date  . Abnormal chest x-ray   . Alcohol abuse   . Anxiety   . Atrial fibrillation ([place])   . Chronic insomnia   . Cigarette smoker   . Colonic polyp   . COPD (chronic obstructive pulmonary disease) ([place])   . Diverticulosis of colon   . DVT (deep venous thrombosis) ([place])   . Fatigue   . GERD (gastroesophageal reflux disease)   . History of pneumonia   . Hypercholesterolemia   . Lumbosacral spondylosis without myelopathy   . Neck pain   . Urinary frequency     Family History  Problem Relation Age of Onset  . Prostate cancer Father   . Cancer Father        prostate  . Hypertension Sister   . Hypertension Mother     Social History   Socioeconomic History  . Marital status: Married    Spouse name: Not on file  . Number of children: 2  . Years of education: Not on file  . Highest education level: Not on file  Occupational History  . Occupation: Reitred from [employer]: [place]  . Financial resource strain: Not on file  . Food insecurity:    Worry: Not on file    Inability: Not on file  . Transportation needs:    Medical: Not on file    Non-medical: Not on file  Tobacco Use  . Smoking status: Current Every Day Smoker    Packs/day: 1.50    Years: 54.00    Pack years: 81.00    Types: Cigarettes  . Smokeless tobacco: Former Systems developer    Types: Chew  . Tobacco comment:  and still smokes sometimes but not everyday  Substance and Sexual Activity  . Alcohol use: Yes    Alcohol/week: 2.0 standard drinks    Types: 2 Cans of beer per week    Comment: occasional  . Drug use: No  . Sexual activity: Not Currently  Lifestyle  . Physical activity:    Days per week: Not on file    Minutes per session: Not on file  . Stress: Not on file  Relationships  . Social connections:    Talks on phone: Not on file    Gets together: Not on file    Attends religious service: Not on file    Active member of club or organization: Not on file    Attends meetings of clubs or organizations: Not on file    Relationship status: Not on file  . Intimate partner violence:    Fear of current or ex partner: Not on file    Emotionally abused: Not on file    Physically abused: Not on file    Forced sexual activity: Not on file  Other Topics Concern  . Not on file  Social History Narrative   Lives with wife.      No Known Allergies   Constitutional: Positive fatigue. Denies headache, fever or abrupt weight changes.  HEENT:  Positive hoarseness, runny nose, sore throat. Denies eye redness, eye pain, pressure behind the eyes, facial pain, nasal congestion, ear pain, ringing in the ears, wax buildup, or bloody nose. Respiratory: Positive cough. Denies difficulty breathing or shortness of breath.  Cardiovascular: Denies chest pain, chest tightness, palpitations or swelling in the hands or feet.   No other specific complaints in a complete review of systems (except as listed in HPI above).  Objective:   BP 120/64   Pulse 88   Temp 99 F (37.2 C) (Oral)   Wt 156 lb (70.8 kg)   SpO2 98%   BMI 23.72 kg/m  Wt Readings from Last 3 Encounters:  03/03/18 156 lb (70.8 kg)  11/04/17 163 lb (73.9 kg)  01/21/17 174 lb (78.9 kg)     General: Appears his stated age, chronically ill appearing, in NAD. HEENT: Head: normal shape and size, no sinus  tenderness noted; Ears: Tm's gray and intact, normal light reflex; Nose: mucosa pink and moist, septum midline; Throat/Mouth:Teeth present, mucosa pink and moist, no exudate noted, no lesions or ulcerations noted.  Neck: No cervical lymphadenopathy.  Cardiovascular: Normal rate with irregular rhythm.  Pulmonary/Chest: Normal effort with diminished breath sounds. No respiratory distress. No wheezes, rales or ronchi noted.       Assessment & Plan:   Runny Nose, Sore Throat, Hoarseness, Cough, COPD:  Get some rest and drink plenty of water Do salt water gargles for the sore throat Chest xray today for further evaluation given duration of cough RX for Symbicort BID as prescribed Encouraged smoking cessation but he declines ? Silent reflux Start Omeprazole OTC   Please reschedule AWV, will follow up after chest xray   Webb Silversmith, NP

## 2018-03-19 ENCOUNTER — Other Ambulatory Visit: Payer: Self-pay | Admitting: Internal Medicine

## 2018-04-01 ENCOUNTER — Encounter: Payer: Self-pay | Admitting: Internal Medicine

## 2018-04-19 ENCOUNTER — Other Ambulatory Visit: Payer: Self-pay

## 2018-04-19 ENCOUNTER — Emergency Department (HOSPITAL_COMMUNITY): Payer: Medicare Other

## 2018-04-19 ENCOUNTER — Encounter (HOSPITAL_COMMUNITY): Payer: Self-pay | Admitting: *Deleted

## 2018-04-19 ENCOUNTER — Other Ambulatory Visit: Payer: Self-pay | Admitting: Internal Medicine

## 2018-04-19 ENCOUNTER — Inpatient Hospital Stay (HOSPITAL_COMMUNITY)
Admission: EM | Admit: 2018-04-19 | Discharge: 2018-05-06 | DRG: 871 | Disposition: E | Payer: Medicare Other | Attending: Internal Medicine | Admitting: Internal Medicine

## 2018-04-19 DIAGNOSIS — C329 Malignant neoplasm of larynx, unspecified: Secondary | ICD-10-CM | POA: Diagnosis not present

## 2018-04-19 DIAGNOSIS — Z7189 Other specified counseling: Secondary | ICD-10-CM | POA: Diagnosis not present

## 2018-04-19 DIAGNOSIS — J690 Pneumonitis due to inhalation of food and vomit: Secondary | ICD-10-CM | POA: Diagnosis not present

## 2018-04-19 DIAGNOSIS — R404 Transient alteration of awareness: Secondary | ICD-10-CM | POA: Diagnosis not present

## 2018-04-19 DIAGNOSIS — E876 Hypokalemia: Secondary | ICD-10-CM | POA: Diagnosis not present

## 2018-04-19 DIAGNOSIS — Z781 Physical restraint status: Secondary | ICD-10-CM

## 2018-04-19 DIAGNOSIS — R0689 Other abnormalities of breathing: Secondary | ICD-10-CM | POA: Diagnosis not present

## 2018-04-19 DIAGNOSIS — Z8042 Family history of malignant neoplasm of prostate: Secondary | ICD-10-CM

## 2018-04-19 DIAGNOSIS — J189 Pneumonia, unspecified organism: Secondary | ICD-10-CM

## 2018-04-19 DIAGNOSIS — G9341 Metabolic encephalopathy: Secondary | ICD-10-CM | POA: Diagnosis not present

## 2018-04-19 DIAGNOSIS — Z66 Do not resuscitate: Secondary | ICD-10-CM | POA: Diagnosis not present

## 2018-04-19 DIAGNOSIS — R0902 Hypoxemia: Secondary | ICD-10-CM

## 2018-04-19 DIAGNOSIS — R0603 Acute respiratory distress: Secondary | ICD-10-CM

## 2018-04-19 DIAGNOSIS — K219 Gastro-esophageal reflux disease without esophagitis: Secondary | ICD-10-CM | POA: Diagnosis not present

## 2018-04-19 DIAGNOSIS — J441 Chronic obstructive pulmonary disease with (acute) exacerbation: Secondary | ICD-10-CM | POA: Diagnosis not present

## 2018-04-19 DIAGNOSIS — C799 Secondary malignant neoplasm of unspecified site: Secondary | ICD-10-CM

## 2018-04-19 DIAGNOSIS — Z8249 Family history of ischemic heart disease and other diseases of the circulatory system: Secondary | ICD-10-CM | POA: Diagnosis not present

## 2018-04-19 DIAGNOSIS — L8992 Pressure ulcer of unspecified site, stage 2: Secondary | ICD-10-CM | POA: Diagnosis not present

## 2018-04-19 DIAGNOSIS — L8990 Pressure ulcer of unspecified site, unspecified stage: Secondary | ICD-10-CM

## 2018-04-19 DIAGNOSIS — R64 Cachexia: Secondary | ICD-10-CM | POA: Diagnosis not present

## 2018-04-19 DIAGNOSIS — R109 Unspecified abdominal pain: Secondary | ICD-10-CM | POA: Diagnosis not present

## 2018-04-19 DIAGNOSIS — I10 Essential (primary) hypertension: Secondary | ICD-10-CM | POA: Diagnosis not present

## 2018-04-19 DIAGNOSIS — E43 Unspecified severe protein-calorie malnutrition: Secondary | ICD-10-CM | POA: Diagnosis present

## 2018-04-19 DIAGNOSIS — Z4659 Encounter for fitting and adjustment of other gastrointestinal appliance and device: Secondary | ICD-10-CM

## 2018-04-19 DIAGNOSIS — I4892 Unspecified atrial flutter: Secondary | ICD-10-CM | POA: Diagnosis not present

## 2018-04-19 DIAGNOSIS — R221 Localized swelling, mass and lump, neck: Secondary | ICD-10-CM | POA: Diagnosis not present

## 2018-04-19 DIAGNOSIS — E7800 Pure hypercholesterolemia, unspecified: Secondary | ICD-10-CM | POA: Diagnosis present

## 2018-04-19 DIAGNOSIS — A419 Sepsis, unspecified organism: Secondary | ICD-10-CM | POA: Diagnosis not present

## 2018-04-19 DIAGNOSIS — I482 Chronic atrial fibrillation, unspecified: Secondary | ICD-10-CM | POA: Diagnosis present

## 2018-04-19 DIAGNOSIS — F17210 Nicotine dependence, cigarettes, uncomplicated: Secondary | ICD-10-CM | POA: Diagnosis present

## 2018-04-19 DIAGNOSIS — Z681 Body mass index (BMI) 19 or less, adult: Secondary | ICD-10-CM | POA: Diagnosis not present

## 2018-04-19 DIAGNOSIS — Z515 Encounter for palliative care: Secondary | ICD-10-CM | POA: Diagnosis not present

## 2018-04-19 DIAGNOSIS — I4891 Unspecified atrial fibrillation: Secondary | ICD-10-CM | POA: Diagnosis not present

## 2018-04-19 DIAGNOSIS — Z7901 Long term (current) use of anticoagulants: Secondary | ICD-10-CM

## 2018-04-19 DIAGNOSIS — J9601 Acute respiratory failure with hypoxia: Secondary | ICD-10-CM | POA: Diagnosis present

## 2018-04-19 DIAGNOSIS — K5730 Diverticulosis of large intestine without perforation or abscess without bleeding: Secondary | ICD-10-CM | POA: Diagnosis not present

## 2018-04-19 DIAGNOSIS — R6510 Systemic inflammatory response syndrome (SIRS) of non-infectious origin without acute organ dysfunction: Secondary | ICD-10-CM | POA: Diagnosis not present

## 2018-04-19 DIAGNOSIS — J387 Other diseases of larynx: Secondary | ICD-10-CM | POA: Diagnosis not present

## 2018-04-19 DIAGNOSIS — R1313 Dysphagia, pharyngeal phase: Secondary | ICD-10-CM | POA: Diagnosis present

## 2018-04-19 DIAGNOSIS — R000 Tachycardia, unspecified: Secondary | ICD-10-CM | POA: Diagnosis not present

## 2018-04-19 DIAGNOSIS — N400 Enlarged prostate without lower urinary tract symptoms: Secondary | ICD-10-CM | POA: Diagnosis present

## 2018-04-19 DIAGNOSIS — L89152 Pressure ulcer of sacral region, stage 2: Secondary | ICD-10-CM | POA: Diagnosis present

## 2018-04-19 DIAGNOSIS — E785 Hyperlipidemia, unspecified: Secondary | ICD-10-CM | POA: Diagnosis present

## 2018-04-19 DIAGNOSIS — R0602 Shortness of breath: Secondary | ICD-10-CM | POA: Diagnosis not present

## 2018-04-19 DIAGNOSIS — R05 Cough: Secondary | ICD-10-CM | POA: Diagnosis not present

## 2018-04-19 DIAGNOSIS — C770 Secondary and unspecified malignant neoplasm of lymph nodes of head, face and neck: Secondary | ICD-10-CM | POA: Diagnosis not present

## 2018-04-19 DIAGNOSIS — F5104 Psychophysiologic insomnia: Secondary | ICD-10-CM | POA: Diagnosis present

## 2018-04-19 DIAGNOSIS — Z8701 Personal history of pneumonia (recurrent): Secondary | ICD-10-CM

## 2018-04-19 DIAGNOSIS — Z931 Gastrostomy status: Secondary | ICD-10-CM | POA: Diagnosis not present

## 2018-04-19 DIAGNOSIS — R1310 Dysphagia, unspecified: Secondary | ICD-10-CM

## 2018-04-19 LAB — CBC WITH DIFFERENTIAL/PLATELET
Abs Immature Granulocytes: 0.05 10*3/uL (ref 0.00–0.07)
Basophils Absolute: 0 10*3/uL (ref 0.0–0.1)
Basophils Relative: 0 %
Eosinophils Absolute: 0 10*3/uL (ref 0.0–0.5)
Eosinophils Relative: 0 %
HCT: 39.7 % (ref 39.0–52.0)
Hemoglobin: 13.1 g/dL (ref 13.0–17.0)
IMMATURE GRANULOCYTES: 1 %
Lymphocytes Relative: 6 %
Lymphs Abs: 0.5 10*3/uL — ABNORMAL LOW (ref 0.7–4.0)
MCH: 32 pg (ref 26.0–34.0)
MCHC: 33 g/dL (ref 30.0–36.0)
MCV: 96.8 fL (ref 80.0–100.0)
Monocytes Absolute: 0.7 10*3/uL (ref 0.1–1.0)
Monocytes Relative: 8 %
NEUTROS ABS: 8.1 10*3/uL — AB (ref 1.7–7.7)
Neutrophils Relative %: 85 %
Platelets: 420 10*3/uL — ABNORMAL HIGH (ref 150–400)
RBC: 4.1 MIL/uL — ABNORMAL LOW (ref 4.22–5.81)
RDW: 14.1 % (ref 11.5–15.5)
WBC Morphology: INCREASED
WBC: 9.4 10*3/uL (ref 4.0–10.5)
nRBC: 0 % (ref 0.0–0.2)

## 2018-04-19 LAB — BRAIN NATRIURETIC PEPTIDE: B NATRIURETIC PEPTIDE 5: 135.3 pg/mL — AB (ref 0.0–100.0)

## 2018-04-19 LAB — POCT I-STAT 7, (LYTES, BLD GAS, ICA,H+H)
Acid-Base Excess: 5 mmol/L — ABNORMAL HIGH (ref 0.0–2.0)
BICARBONATE: 28.6 mmol/L — AB (ref 20.0–28.0)
Calcium, Ion: 1.43 mmol/L — ABNORMAL HIGH (ref 1.15–1.40)
HCT: 38 % — ABNORMAL LOW (ref 39.0–52.0)
Hemoglobin: 12.9 g/dL — ABNORMAL LOW (ref 13.0–17.0)
O2 Saturation: 96 %
Patient temperature: 102.6
Potassium: 4.4 mmol/L (ref 3.5–5.1)
Sodium: 144 mmol/L (ref 135–145)
TCO2: 30 mmol/L (ref 22–32)
pCO2 arterial: 43.6 mmHg (ref 32.0–48.0)
pH, Arterial: 7.433 (ref 7.350–7.450)
pO2, Arterial: 88 mmHg (ref 83.0–108.0)

## 2018-04-19 LAB — D-DIMER, QUANTITATIVE: D-Dimer, Quant: 3.94 ug/mL-FEU — ABNORMAL HIGH (ref 0.00–0.50)

## 2018-04-19 LAB — TROPONIN I: Troponin I: <0.03 ng/mL (ref <0.03)

## 2018-04-19 LAB — LACTATE DEHYDROGENASE: LDH: 107 U/L (ref 98–192)

## 2018-04-19 LAB — I-STAT TROPONIN, ED: Troponin i, poc: 0.02 ng/mL (ref 0.00–0.08)

## 2018-04-19 LAB — COMPREHENSIVE METABOLIC PANEL
ALT: 14 U/L (ref 0–44)
AST: 21 U/L (ref 15–41)
Albumin: 2.7 g/dL — ABNORMAL LOW (ref 3.5–5.0)
Alkaline Phosphatase: 52 U/L (ref 38–126)
Anion gap: 11 (ref 5–15)
BUN: 20 mg/dL (ref 8–23)
CO2: 26 mmol/L (ref 22–32)
Calcium: 10.3 mg/dL (ref 8.9–10.3)
Chloride: 103 mmol/L (ref 98–111)
Creatinine, Ser: 1.27 mg/dL — ABNORMAL HIGH (ref 0.61–1.24)
GFR calc Af Amer: >60 mL/min (ref >60)
GFR calc non Af Amer: 53 mL/min — ABNORMAL LOW (ref >60)
Glucose, Bld: 121 mg/dL — ABNORMAL HIGH (ref 70–99)
POTASSIUM: 4 mmol/L (ref 3.5–5.1)
SODIUM: 140 mmol/L (ref 135–145)
Total Bilirubin: 1.5 mg/dL — ABNORMAL HIGH (ref 0.3–1.2)
Total Protein: 7.6 g/dL (ref 6.5–8.1)

## 2018-04-19 LAB — RESPIRATORY PANEL BY PCR

## 2018-04-19 LAB — PROCALCITONIN: Procalcitonin: 0.82 ng/mL

## 2018-04-19 LAB — INFLUENZA PANEL BY PCR (TYPE A & B)
Influenza A By PCR: NEGATIVE
Influenza B By PCR: NEGATIVE

## 2018-04-19 LAB — LACTIC ACID, PLASMA: Lactic Acid, Venous: 3.7 mmol/L (ref 0.5–1.9)

## 2018-04-19 LAB — C-REACTIVE PROTEIN: CRP: 28.1 mg/dL — ABNORMAL HIGH (ref <1.0)

## 2018-04-19 MED ORDER — ACETAMINOPHEN 325 MG PO TABS
650.0000 mg | ORAL_TABLET | Freq: Once | ORAL | Status: DC
  Filled 2018-04-19: qty 2

## 2018-04-19 MED ORDER — ALBUTEROL (5 MG/ML) CONTINUOUS INHALATION SOLN
10.0000 mg/h | INHALATION_SOLUTION | Freq: Once | RESPIRATORY_TRACT | Status: AC
  Administered 2018-04-19: 10 mg/h via RESPIRATORY_TRACT
  Filled 2018-04-19: qty 20

## 2018-04-19 MED ORDER — LACTATED RINGERS IV BOLUS
1000.0000 mL | Freq: Once | INTRAVENOUS | Status: AC
  Administered 2018-04-19: 1000 mL via INTRAVENOUS

## 2018-04-19 MED ORDER — IPRATROPIUM BROMIDE 0.02 % IN SOLN
0.5000 mg | Freq: Once | RESPIRATORY_TRACT | Status: AC
  Administered 2018-04-19: 0.5 mg via RESPIRATORY_TRACT
  Filled 2018-04-19: qty 2.5

## 2018-04-19 MED ORDER — SODIUM CHLORIDE 0.9 % IV SOLN
1.0000 g | Freq: Once | INTRAVENOUS | Status: AC
  Administered 2018-04-19: 1 g via INTRAVENOUS
  Filled 2018-04-19: qty 10

## 2018-04-19 MED ORDER — METHYLPREDNISOLONE SODIUM SUCC 125 MG IJ SOLR
125.0000 mg | Freq: Once | INTRAMUSCULAR | Status: AC
  Administered 2018-04-19: 125 mg via INTRAVENOUS
  Filled 2018-04-19: qty 2

## 2018-04-19 MED ORDER — SODIUM CHLORIDE 0.9 % IV SOLN
500.0000 mg | Freq: Once | INTRAVENOUS | Status: AC
  Administered 2018-04-19: 500 mg via INTRAVENOUS
  Filled 2018-04-19: qty 500

## 2018-04-19 NOTE — ED Notes (Signed)
Pt not answering questions asked  hyperventilating

## 2018-04-19 NOTE — Progress Notes (Signed)
Albuterol 10mg /Atrovent 0.5mg  continuous treatment started and pt placed on BIPAP per order with current settings: 20/7 RR 16 50%. ABG collected, with no critical values to report. RT Will continue to monitor.

## 2018-04-19 NOTE — ED Notes (Signed)
MD speaking with family at bedside 

## 2018-04-19 NOTE — ED Notes (Signed)
Family is on the way

## 2018-04-19 NOTE — ED Triage Notes (Signed)
The pt arrived by gems from home full code  Pt is a smoker.  He has had sob for one week and difficulty breathing   He has been to his doctor and has had 2 chest xrays that are clear  More difficulty breathing today r 70  Iv er ems

## 2018-04-19 NOTE — ED Notes (Signed)
Complete linen change performed, very poor hygiene as well as what appear to be ground cigarettes in clothing.  Pt rolled and cleaned, soiled boxer shorts removed.  All clothing double bagged at bedside d/t pungent odor.

## 2018-04-19 NOTE — ED Notes (Signed)
Family at bedside, MD notified

## 2018-04-19 NOTE — ED Provider Notes (Signed)
[facility] DEPART[facility] Provider Note   CSN: [id number] Arrival date & time: 05/05/2018  1853    History   Chief Complaint Chief Complaint  Patient presents with   Respiratory Distress    HPI Kurt [name] is a 78 y.o. male.     The history is provided by the EMS personnel and the patient.  Shortness of Breath  Severity:  Severe Onset quality:  Gradual Timing:  Constant Progression:  Worsening Chronicity:  Recurrent Context: smoke exposure and URI   Relieved by:  Nothing Worsened by:  Nothing Associated symptoms: cough, fever and wheezing   Associated symptoms: no abdominal pain, no chest pain, no ear pain, no rash, no sore throat and no vomiting   Risk factors: hx of PE/DVT     Past Medical History:  Diagnosis Date   Abnormal chest x-ray    Alcohol abuse    Anxiety    Atrial fibrillation (HCC)    Chronic insomnia    Cigarette smoker    Colonic polyp    COPD (chronic obstructive pulmonary disease) (HCC)    Diverticulosis of colon    DVT (deep venous thrombosis) (HCC)    Fatigue    GERD (gastroesophageal reflux disease)    History of pneumonia    Hypercholesterolemia    Lumbosacral spondylosis without myelopathy    Neck pain    Urinary frequency     Patient Active Problem List   Diagnosis Date Noted   Acute respiratory failure with hypoxia ([place]) 04/13/2018   Anxiety 01/21/2017   HLD (hyperlipidemia) 01/21/2017   BPH (benign prostatic hyperplasia) 01/23/2015   PAROXYSMAL ATRIAL FIBRILLATION 10/31/2009   INSOMNIA, CHRONIC 12/21/2007   LUMBOSACRAL SPONDYLOSIS WITHOUT MYELOPATHY 12/21/2007   COPD mixed type ([place]) 07/24/2007   GERD 07/24/2007    Past Surgical History:  Procedure Laterality Date   3 seperate lumbar laminectomies     right CTS release     Dr. Fredna Dow 12/00        Home Medications    Prior to Admission medications   Medication Sig Start Date End Date Taking? Authorizing  Provider  budesonide-formoterol (SYMBICORT) 80-4.5 MCG/ACT inhaler Inhale 2 puffs into the lungs 2 (two) times daily. 03/03/18  Yes Baity, Coralie Keens, NP  diltiazem (CARDIZEM CD) 180 MG 24 hr capsule TAKE 1 CAPSULE EVERY DAY Patient taking differently: Take 180 mg by mouth daily.  01/26/18  Yes Baity, Coralie Keens, NP  ELIQUIS 5 MG TABS tablet TAKE 1 TABLET BY MOUTH TWICE A DAY Patient taking differently: Take 5 mg by mouth 2 (two) times daily.  03/20/18  Yes [name], NP  omeprazole (PRILOSEC) 20 MG capsule Take 1 capsule (20 mg total) by mouth daily. 03/03/18  Yes Baity, Coralie Keens, NP  gabapentin (NEURONTIN) 300 MG capsule TAKE 1 CAPSULE BY MOUTH THREE TIMES A DAY Patient taking differently: Take 300 mg by mouth 3 (three) times daily.  01/20/18   [name], NP  ibuprofen (ADVIL,MOTRIN) 200 MG tablet Per bottle as needed    [provider]  MYRBETRIQ 50 MG TB24 tablet Take 50 mg by mouth daily.  01/17/17   [provider]  tamsulosin (FLOMAX) 0.4 MG CAPS capsule TAKE 1 CAPSULE (0.4 MG TOTAL) BY MOUTH DAILY. 10/21/16   [name], NP  traZODone (DESYREL) 50 MG tablet TAKE 1/2 TO 1 TABLET BY MOUTH AT BEDTIME AS NEEDED FOR SLEEP 01/22/17   [name], NP    Family History Family  History  Problem Relation Age of Onset   Prostate cancer Father    Cancer Father        prostate   Hypertension Sister    Hypertension Mother     Social History Social History   Tobacco Use   Smoking status: Current Every Day Smoker    Packs/day: 1.50    Years: 54.00    Pack years: 81.00    Types: Cigarettes   Smokeless tobacco: Former Systems developer    Types: Chew   Tobacco comment:  and still smokes sometimes but not everyday  Substance Use Topics   Alcohol use: Yes    Alcohol/week: 2.0 standard drinks    Types: 2 Cans of beer per week    Comment: occasional   Drug use: No     Allergies   Patient has no known allergies.   Review of Systems Review of Systems    Constitutional: Positive for fever. Negative for chills.  HENT: Negative for ear pain and sore throat.   Eyes: Negative for pain and visual disturbance.  Respiratory: Positive for cough, shortness of breath and wheezing.   Cardiovascular: Negative for chest pain and palpitations.  Gastrointestinal: Negative for abdominal pain and vomiting.  Genitourinary: Negative for dysuria and hematuria.  Musculoskeletal: Negative for arthralgias and back pain.  Skin: Negative for color change and rash.  Neurological: Negative for seizures and syncope.  All other systems reviewed and are negative.    Physical Exam Updated Vital Signs  ED Triage Vitals  Enc Vitals Group     BP 04/25/2018 1907 131/89     Pulse Rate 04/24/2018 1907 (!) 126     Resp 05/05/2018 1907 (!) 72     Temp 04/21/2018 1907 (!) 102.6 F (39.2 C)     Temp Source 04/11/2018 1907 Rectal     SpO2 04/24/2018 1907 96 %     Weight 04/11/2018 1909 171 lb 15.3 oz (78 kg)     Height 04/23/2018 1909 6' (1.829 m)     Head Circumference --      Peak Flow --      Pain Score 04/14/2018 1908 0     Pain Loc --      Pain Edu? --      Excl. in [place]? --     Physical Exam Vitals signs and nursing note reviewed.  Constitutional:      General: He is in acute distress.     Appearance: He is well-developed. He is ill-appearing.  HENT:     Head: Normocephalic and atraumatic.     Nose: Nose normal.     Mouth/Throat:     Mouth: Mucous membranes are moist.  Eyes:     Extraocular Movements: Extraocular movements intact.     Conjunctiva/sclera: Conjunctivae normal.     Pupils: Pupils are equal, round, and reactive to light.  Neck:     Musculoskeletal: Normal range of motion and neck supple. No muscular tenderness.  Cardiovascular:     Rate and Rhythm: Tachycardia present. Rhythm irregular.     Pulses: Normal pulses.     Heart sounds: Normal heart sounds. No murmur.  Pulmonary:     Effort: Respiratory distress present.     Breath sounds: Wheezing  present.  Abdominal:     General: Abdomen is flat.     Palpations: Abdomen is soft.     Tenderness: There is no abdominal tenderness.  Musculoskeletal: Normal range of motion.     Right lower leg: No edema.  Left lower leg: No edema.  Skin:    General: Skin is warm and dry.     Capillary Refill: Capillary refill takes less than 2 seconds.  Neurological:     General: No focal deficit present.     Mental Status: He is alert.  Psychiatric:        Mood and Affect: Mood normal.      ED Treatments / Results  Labs (all labs ordered are listed, but only abnormal results are displayed) Labs Reviewed  COMPREHENSIVE METABOLIC PANEL - Abnormal; Notable for the following components:      Result Value   Glucose, Bld 121 (*)    Creatinine, Ser 1.27 (*)    Albumin 2.7 (*)    Total Bilirubin 1.5 (*)    GFR calc non Af Amer 53 (*)    All other components within normal limits  CBC WITH DIFFERENTIAL/PLATELET - Abnormal; Notable for the following components:   RBC 4.10 (*)    Platelets 420 (*)    Neutro Abs 8.1 (*)    Lymphs Abs 0.5 (*)    All other components within normal limits  BRAIN NATRIURETIC PEPTIDE - Abnormal; Notable for the following components:   B Natriuretic Peptide 135.3 (*)    All other components within normal limits  LACTIC ACID, PLASMA - Abnormal; Notable for the following components:   Lactic Acid, Venous 3.7 (*)    All other components within normal limits  D-DIMER, QUANTITATIVE (NOT AT [facility] - Abnormal; Notable for the following components:   D-Dimer, Quant 3.94 (*)    All other components within normal limits  C-REACTIVE PROTEIN - Abnormal; Notable for the following components:   CRP 28.1 (*)    All other components within normal limits  POCT I-STAT 7, (LYTES, BLD GAS, ICA,H+H) - Abnormal; Notable for the following components:   Bicarbonate 28.6 (*)    Acid-Base Excess 5.0 (*)    Calcium, Ion 1.43 (*)    HCT 38.0 (*)    Hemoglobin 12.9 (*)    All other  components within normal limits  RESPIRATORY PANEL BY PCR  URINE CULTURE  CULTURE, BLOOD (ROUTINE X 2)  CULTURE, BLOOD (ROUTINE X 2)  NOVEL CORONAVIRUS, NAA (HOSPITAL ORDER, SEND-OUT TO REF LAB)  INFLUENZA PANEL BY PCR (TYPE A & B)  LACTATE DEHYDROGENASE  TROPONIN I  PROCALCITONIN  URINALYSIS, ROUTINE W REFLEX MICROSCOPIC  LACTIC ACID, PLASMA  I-STAT TROPONIN, ED    EKG EKG Interpretation  Date/Time:  Sunday April 19 2018 19:08:00 EDT Ventricular Rate:  132 PR Interval:    QRS Duration: 74 QT Interval:  339 QTC Calculation: 481 R Axis:   45 Text Interpretation:  Atrial fibrillation RSR' in V1 or V2, right VCD or RVH Borderline prolonged QT interval Confirmed by Lennice Sites [phone]) on 04/27/2018 8:01:23 PM   Radiology Dg Chest Port 1 View  Result Date: 04/21/2018 CLINICAL DATA:  Shortness of breath, difficulty breathing EXAM: PORTABLE CHEST 1 VIEW COMPARISON:  03/03/2018 FINDINGS: Bilateral lower lobe airspace opacities are noted, worsening since prior study compatible with worsening pneumonia. Mild hyperinflation/COPD. Mild cardiomegaly. No visible significant effusions or acute bony abnormality. IMPRESSION: Hyperinflation/COPD. Worsening bilateral lower lobe airspace opacities concerning for pneumonia. Electronically Signed   By: Rolm Baptise M.D.   On: 04/16/2018 19:31    Procedures .Critical Care Performed by: Lennice Sites, DO Authorized by: Lennice Sites, DO   Critical care provider statement:    Critical care time (minutes):  80   Critical care  time was exclusive of:  Separately billable procedures and treating other patients and teaching time   Critical care was necessary to treat or prevent imminent or life-threatening deterioration of the following conditions:  Sepsis and respiratory failure   Critical care was time spent personally by me on the following activities:  Blood draw for specimens, development of treatment plan with patient or surrogate, discussions  with primary provider, discussions with consultants, evaluation of patient's response to treatment, examination of patient, obtaining history from patient or surrogate, ordering and performing treatments and interventions, ordering and review of laboratory studies, ordering and review of radiographic studies, pulse oximetry, re-evaluation of patient's condition and review of old charts   I assumed direction of critical care for this patient from another provider in my specialty: no     (including critical care time)  Medications Ordered in ED Medications  acetaminophen (TYLENOL) tablet 650 mg (has no administration in time range)  albuterol (PROVENTIL,VENTOLIN) solution continuous neb (10 mg/hr Nebulization Given 05/02/2018 1922)  ipratropium (ATROVENT) nebulizer solution 0.5 mg (0.5 mg Nebulization Given 05/01/2018 1922)  methylPREDNISolone sodium succinate (SOLU-MEDROL) 125 mg/2 mL injection 125 mg (125 mg Intravenous Given 04/09/2018 1955)  lactated ringers bolus 1,000 mL (0 mLs Intravenous Stopped 04/22/2018 2108)  cefTRIAXone (ROCEPHIN) 1 g in sodium chloride 0.9 % 100 mL IVPB (0 g Intravenous Stopped 04/16/2018 2108)  azithromycin (ZITHROMAX) 500 mg in sodium chloride 0.9 % 250 mL IVPB (0 mg Intravenous Stopped 04/29/2018 2108)  lactated ringers bolus 1,000 mL (0 mLs Intravenous Stopped 05/05/2018 2304)     Initial Impression / Assessment and Plan / ED Course  I have reviewed the triage vital signs and the nursing notes.  Pertinent labs & imaging results that were available during my care of the patient were reviewed by me and considered in my medical decision making (see chart for details).     Kimoni KAIKOA MAGRO is a 78 year old male with history of atrial fibrillation on Eliquis, DVT, COPD, current everyday smoker who presents to the ED with shortness of breath, respiratory distress.  Patient arrives tachypneic in the 70s, hypoxic on 5 L of oxygen, tachycardic with atrial fibrillation with RVR, febrile to  102, normotensive.  Patient has been feeling bad over the last several days.  He has had cough and wheezing.  Family was finally able to convince patient to come to the hospital.  He comes with EMS looking critically ill.  Likely sepsis from pneumonia with underlying COPD exacerbation.  Patient has not had any recent travel or known exposure to coronavirus.  Continues to smoke.  Patient had good capacity upon arrival and had discussion with him upfront about his CODE STATUS as I am concerned about his respiratory status and need for possible intubation.  After prolonged discussion while examining the patient he would like to be a DO NOT RESUSCITATE.  Patient has wheezing throughout on exam, tachypneic and respiratory distress.  Suspect that he likely has pneumonia.   Given that he is now DNR will place him on BiPAP and give him DuoNeb treatments, steroids, empiric antibiotics with IV Rocephin and IV Zithromax.  Will give lactated Ringer bolus and initiate septic work-up with blood cultures, urine studies, chest x-ray, influenza testing, RVP.  Possible coronavirus given recent outbreak.  Contacted infectious disease by the phone and had a prolonged discussion with them about the patient as well.  If patient tested negative for influenza they do recommend testing for coronavirus.  I wore PPE throughout taking care  of this patient.  I believe that BiPAP is reasonable in this patient although we are trying to avoid that in this current outbreak of coronavirus but as patient is DNR would like to give him the best chance possible and I believe BiPAP will allow that given his COPD history.  At this time it is very unlikely that patient does have coronavirus as we have not seen it yet in our community and he has very low exposure risk.  Family states that he basically lives by himself and not many people see him and he does not go out very often.  Patient improved greatly on BiPAP.  Blood gases overall very reassuring.   Patient had bilateral pneumonia on chest x-ray.  Elevated lactic acid.  Normal white count.  No other significant electrolyte abnormality, kidney injury, leukocytosis.  Influenza testing was negative.  Respiratory viral panel is negative.  Doubt PE as patient is on Eliquis.  Multiple other sources for his symptoms at this time.  Coronavirus testing is pending.  Patient now under airborne precautions given that he is on BiPAP. Patient admitted to hospital service for further care.  Hemodynamically improved while under my care.  Urinalysis is pending.  This chart was dictated using voice recognition software.  Despite best efforts to proofread,  errors can occur which can change the documentation meaning.    Final Clinical Impressions(s) / ED Diagnoses   Final diagnoses:  Acute respiratory failure with hypoxia (HCC)  COPD exacerbation (HCC)  Sepsis, due to unspecified organism, unspecified whether acute organ dysfunction present [facility])  Community acquired pneumonia, unspecified laterality    ED Discharge Orders    None       Lennice Sites, DO 04/20/18 0013

## 2018-04-19 NOTE — ED Notes (Signed)
ED TO INPATIENT HANDOFF REPORT  ED Nurse Name and Phone #: Ewart Carrera, RN  S Name/Age/Gender Kurt [name] 78 y.o. male Room/Bed: 033C/033C  Code Status   Code Status: DNR  Home/SNF/Other Home Patient oriented to: Is this baseline? Yes    Triage Complete: Triage complete  Chief Complaint Resp distress  Triage Note The pt arrived by gems from home full code  Pt is a smoker.  He has had sob for one week and difficulty breathing   He has been to his doctor and has had 2 chest xrays that are clear  More difficulty breathing today r 70  Iv er ems   Allergies No Known Allergies  Level of Care/Admitting Diagnosis ED Disposition    ED Disposition Condition [place]: [place] [[id number]]  Level of Care: Progressive [102]  Diagnosis: Acute respiratory failure with hypoxia [facility]) [[id number]]  Admitting Physician: Rise Patience [phone]  Attending Physician: Rise Patience [phone]  Estimated length of stay: past midnight tomorrow  Certification:: I certify this patient will need inpatient services for at least 2 midnights  PT Class (Do Not Modify): Inpatient [101]  PT Acc Code (Do Not Modify): Private [1]       B Medical/Surgery History Past Medical History:  Diagnosis Date  . Abnormal chest x-ray   . Alcohol abuse   . Anxiety   . Atrial fibrillation ([place])   . Chronic insomnia   . Cigarette smoker   . Colonic polyp   . COPD (chronic obstructive pulmonary disease) ([place])   . Diverticulosis of colon   . DVT (deep venous thrombosis) ([place])   . Fatigue   . GERD (gastroesophageal reflux disease)   . History of pneumonia   . Hypercholesterolemia   . Lumbosacral spondylosis without myelopathy   . Neck pain   . Urinary frequency    Past Surgical History:  Procedure Laterality Date  . 3 seperate lumbar laminectomies    . right CTS release     Dr. Fredna Dow 12/00     A IV Location/Drains/Wounds Patient Lines/Drains/Airways  Status   Active Line/Drains/Airways    Name:   Placement date:   Placement time:   Site:   Days:   Peripheral IV 04/20/2018 Right Forearm   04/22/2018    1943    Forearm   less than 1   Peripheral IV 04/18/2018 Right Antecubital   05/02/2018    1955    Antecubital   less than 1          Intake/Output Last 24 hours  Intake/Output Summary (Last 24 hours) at 04/11/2018 2351 Last data filed at 05/01/2018 2304 Gross per 24 hour  Intake 1000 ml  Output -  Net 1000 ml    Labs/Imaging Results for orders placed or performed during the hospital encounter of 04/21/2018 (from the past 48 hour(s))  Comprehensive metabolic panel     Status: Abnormal   Collection Time: 04/23/2018  7:32 PM  Result Value Ref Range   Sodium 140 135 - 145 mmol/L   Potassium 4.0 3.5 - 5.1 mmol/L   Chloride 103 98 - 111 mmol/L   CO2 26 22 - 32 mmol/L   Glucose, Bld 121 (H) 70 - 99 mg/dL   BUN 20 8 - 23 mg/dL   Creatinine, Ser 1.27 (H) 0.61 - 1.24 mg/dL   Calcium 10.3 8.9 - 10.3 mg/dL   Total Protein 7.6 6.5 - 8.1 g/dL   Albumin 2.7 (L)  3.5 - 5.0 g/dL   AST 21 15 - 41 U/L   ALT 14 0 - 44 U/L   Alkaline Phosphatase 52 38 - 126 U/L   Total Bilirubin 1.5 (H) 0.3 - 1.2 mg/dL   GFR calc non Af Amer 53 (L) >60 mL/min   GFR calc Af Amer >60 >60 mL/min   Anion gap 11 5 - 15    Comment: Performed at [facility] [address] [place], [place] [address]  CBC WITH DIFFERENTIAL     Status: Abnormal   Collection Time: 04/21/2018  7:32 PM  Result Value Ref Range   WBC 9.4 4.0 - 10.5 K/uL    Comment: WHITE COUNT CONFIRMED ON SMEAR   RBC 4.10 (L) 4.22 - 5.81 MIL/uL   Hemoglobin 13.1 13.0 - 17.0 g/dL   HCT 39.7 39.0 - 52.0 %   MCV 96.8 80.0 - 100.0 fL   MCH 32.0 26.0 - 34.0 pg   MCHC 33.0 30.0 - 36.0 g/dL   RDW 14.1 11.5 - 15.5 %   Platelets 420 (H) 150 - 400 K/uL   nRBC 0.0 0.0 - 0.2 %   Neutrophils Relative % 85 %   Neutro Abs 8.1 (H) 1.7 - 7.7 K/uL   Lymphocytes Relative 6 %   Lymphs Abs 0.5 (L) 0.7 - 4.0 K/uL    Monocytes Relative 8 %   Monocytes Absolute 0.7 0.1 - 1.0 K/uL   Eosinophils Relative 0 %   Eosinophils Absolute 0.0 0.0 - 0.5 K/uL   Basophils Relative 0 %   Basophils Absolute 0.0 0.0 - 0.1 K/uL   WBC Morphology INCREASED BANDS (>20% BANDS)     Comment: VACUOLATED NEUTROPHILS   Immature Granulocytes 1 %   Abs Immature Granulocytes 0.05 0.00 - 0.07 K/uL    Comment: Performed at [facility], [place] [address] [place], [place] [address]  Brain natriuretic peptide     Status: Abnormal   Collection Time: 04/13/2018  7:33 PM  Result Value Ref Range   B Natriuretic Peptide 135.3 (H) 0.0 - 100.0 pg/mL    Comment: Performed at [facility] [address] [place], [place] [address]  I-Stat Troponin, ED (not at [facility])     Status: None   Collection Time: 04/18/2018  7:40 PM  Result Value Ref Range   Troponin i, poc 0.02 0.00 - 0.08 ng/mL   Comment 3            Comment: Due to the release kinetics of cTnI, a negative result within the first hours of the onset of symptoms does not rule out myocardial infarction with certainty. If myocardial infarction is still suspected, repeat the test at appropriate intervals.   I-STAT 7, (LYTES, BLD GAS, ICA, H+H)     Status: Abnormal   Collection Time: 04/27/2018  7:49 PM  Result Value Ref Range   pH, Arterial 7.433 7.350 - 7.450   pCO2 arterial 43.6 32.0 - 48.0 mmHg   pO2, Arterial 88.0 83.0 - 108.0 mmHg   Bicarbonate 28.6 (H) 20.0 - 28.0 mmol/L   TCO2 30 22 - 32 mmol/L   O2 Saturation 96.0 %   Acid-Base Excess 5.0 (H) 0.0 - 2.0 mmol/L   Sodium 144 135 - 145 mmol/L   Potassium 4.4 3.5 - 5.1 mmol/L   Calcium, Ion 1.43 (H) 1.15 - 1.40 mmol/L   HCT 38.0 (L) 39.0 - 52.0 %   Hemoglobin 12.9 (L) 13.0 - 17.0 g/dL  Patient temperature 102.6 F    Collection site RADIAL, ALLEN'S TEST ACCEPTABLE    Drawn by Operator    Sample type ARTERIAL   Influenza panel by PCR (type A & B)     Status: None   Collection Time: 04/05/2018  7:58 PM  Result Value  Ref Range   Influenza A By PCR NEGATIVE NEGATIVE   Influenza B By PCR NEGATIVE NEGATIVE    Comment: (NOTE) The Xpert Xpress Flu assay is intended as an aid in the diagnosis of  influenza and should not be used as a sole basis for treatment.  This  assay is FDA approved for nasopharyngeal swab specimens only. Nasal  washings and aspirates are unacceptable for Xpert Xpress Flu testing. Performed at [facility], [place] [address], [place], [place] [address]   Respiratory Panel by PCR     Status: None   Collection Time: 04/24/2018  7:58 PM  Result Value Ref Range   Adenovirus NOT DETECTED NOT DETECTED   Coronavirus 229E NOT DETECTED NOT DETECTED    Comment: (NOTE) The Coronavirus on the Respiratory Panel, DOES NOT test for the novel  Coronavirus (2019 nCoV)    Coronavirus HKU1 NOT DETECTED NOT DETECTED   Coronavirus NL63 NOT DETECTED NOT DETECTED   Coronavirus OC43 NOT DETECTED NOT DETECTED   Metapneumovirus NOT DETECTED NOT DETECTED   Rhinovirus / Enterovirus NOT DETECTED NOT DETECTED   Influenza A NOT DETECTED NOT DETECTED   Influenza B NOT DETECTED NOT DETECTED   Parainfluenza Virus 1 NOT DETECTED NOT DETECTED   Parainfluenza Virus 2 NOT DETECTED NOT DETECTED   Parainfluenza Virus 3 NOT DETECTED NOT DETECTED   Parainfluenza Virus 4 NOT DETECTED NOT DETECTED   Respiratory Syncytial Virus NOT DETECTED NOT DETECTED   Bordetella pertussis NOT DETECTED NOT DETECTED   Chlamydophila pneumoniae NOT DETECTED NOT DETECTED   Mycoplasma pneumoniae NOT DETECTED NOT DETECTED    Comment: Performed at [facility], [place] [address] [place], [place] [address]  Lactic acid, plasma     Status: Abnormal   Collection Time: 04/21/2018 10:33 PM  Result Value Ref Range   Lactic Acid, Venous 3.7 (HH) 0.5 - 1.9 mmol/L    Comment: CRITICAL RESULT CALLED TO, READ BACK BY AND VERIFIED WITH: PHILLIPS T,RN [id number] WAYK Performed at [facility], [place] [address] [place], [place] [address]    Troponin I - Once     Status: None   Collection Time: 04/08/2018 10:50 PM  Result Value Ref Range   Troponin I <0.03 <0.03 ng/mL    Comment: Performed at [facility] [address], [place], [place] [address]  D-dimer, quantitative (not at [facility])     Status: Abnormal   Collection Time: 04/13/2018 10:50 PM  Result Value Ref Range   D-Dimer, Quant 3.94 (H) 0.00 - 0.50 ug/mL-FEU    Comment: (NOTE) At the manufacturer cut-off of 0.50 ug/mL FEU, this assay has been documented to exclude PE with a sensitivity and negative predictive value of 97 to 99%.  At this time, this assay has not been approved by the FDA to exclude DVT/VTE. Results should be correlated with clinical presentation. Performed at [facility], [place] [address], [place],  [address]   C-reactive protein     Status: Abnormal   Collection Time: 04/19/18 10:50 PM  Result Value Ref Range   CRP 28.1 (H) <1.0 mg/dL    Comment: Performed at [facility]  [address], [place], [place] [address]  Procalcitonin - Baseline     Status: None   Collection Time: 04/29/2018 10:50 PM  Result Value Ref Range   Procalcitonin 0.82 ng/mL    Comment:        Interpretation: PCT > 0.5 ng/mL and <= 2 ng/mL: Systemic infection (sepsis) is possible, but other conditions are known to elevate PCT as well. (NOTE)       Sepsis PCT Algorithm           Lower Respiratory Tract                                      Infection PCT Algorithm    ----------------------------     ----------------------------         PCT < 0.25 ng/mL                PCT < 0.10 ng/mL         Strongly encourage             Strongly discourage   discontinuation of antibiotics    initiation of antibiotics    ----------------------------     -----------------------------       PCT 0.25 - 0.50 ng/mL            PCT 0.10 - 0.25 ng/mL               OR       >80% decrease in PCT            Discourage initiation of                                             antibiotics      Encourage discontinuation           of antibiotics    ----------------------------     -----------------------------         PCT >= 0.50 ng/mL              PCT 0.26 - 0.50 ng/mL                AND       <80% decrease in PCT             Encourage initiation of                                             antibiotics       Encourage continuation           of antibiotics    ----------------------------     -----------------------------        PCT >= 0.50 ng/mL                  PCT > 0.50 ng/mL               AND         increase in PCT                  Strongly encourage  initiation of antibiotics    Strongly encourage escalation           of antibiotics                                     -----------------------------                                           PCT <= 0.25 ng/mL                                                 OR                                        > 80% decrease in PCT                                     Discontinue / Do not initiate                                             antibiotics Performed at [facility], [address], [place], [place] [address]    Dg Chest Port 1 View  Result Date: 04/17/2018 CLINICAL DATA:  Shortness of breath, difficulty breathing EXAM: PORTABLE CHEST 1 VIEW COMPARISON:  03/03/2018 FINDINGS: Bilateral lower lobe airspace opacities are noted, worsening since prior study compatible with worsening pneumonia. Mild hyperinflation/COPD. Mild cardiomegaly. No visible significant effusions or acute bony abnormality. IMPRESSION: Hyperinflation/COPD. Worsening bilateral lower lobe airspace opacities concerning for pneumonia. Electronically Signed   By: Rolm Baptise M.D.   On: 04/10/2018 19:31    Pending Labs Unresulted Labs (From admission, onward)    Start     Ordered   04/18/2018 2237  Lactate dehydrogenase  Once,   R     04/11/2018 2237   04/07/2018 2152  Lactic acid, plasma  Now then  every 2 hours,   STAT     04/28/2018 2151   04/18/2018 2138  Novel Coronavirus, NAA (hospital order; send-out to ref lab)  (Novel Coronavirus, NAA [facility] Order; send-out to ref lab) with precautions panel)  Once,   R    Question:  Patient immune status  Answer:  Normal   04/29/2018 2137   04/13/2018 1906  Blood culture (routine x 2)  BLOOD CULTURE X 2,   STAT     04/20/2018 1906   04/15/2018 1858  Urine culture  ONCE - STAT,   STAT     04/06/2018 1857   04/15/2018 1858  Urinalysis, Routine w reflex microscopic  Once,   R     04/18/2018 1857          Vitals/Pain Today's Vitals   04/27/2018 2230 04/26/2018 2300 04/24/2018 2303 04/10/2018 2315  BP: 121/90 122/80  110/87  Pulse: (!) 121 (!) 115  (!) 122  Resp: (!) 43 (!) 31  (!) 33  Temp:  TempSrc:      SpO2: 96% 98%  99%  Weight:      Height:      PainSc:   0-No pain     Isolation Precautions Airborne and Contact precautions  Medications Medications  acetaminophen (TYLENOL) tablet 650 mg (has no administration in time range)  albuterol (PROVENTIL,VENTOLIN) solution continuous neb (10 mg/hr Nebulization Given 04/24/2018 1922)  ipratropium (ATROVENT) nebulizer solution 0.5 mg (0.5 mg Nebulization Given 04/23/2018 1922)  methylPREDNISolone sodium succinate (SOLU-MEDROL) 125 mg/2 mL injection 125 mg (125 mg Intravenous Given 05/02/2018 1955)  lactated ringers bolus 1,000 mL (0 mLs Intravenous Stopped 04/16/2018 2108)  cefTRIAXone (ROCEPHIN) 1 g in sodium chloride 0.9 % 100 mL IVPB (0 g Intravenous Stopped 04/12/2018 2108)  azithromycin (ZITHROMAX) 500 mg in sodium chloride 0.9 % 250 mL IVPB (0 mg Intravenous Stopped 04/15/2018 2108)  lactated ringers bolus 1,000 mL (0 mLs Intravenous Stopped 04/13/2018 2304)    Mobility walks High fall risk   Focused Assessments Cardiac Assessment Handoff:    Lab Results  Component Value Date   CKTOTAL 84 06/30/2009   CKMB 0.8 06/30/2009   TROPONINI <0.03 04/18/2018   Lab Results  Component Value Date   DDIMER 3.94 (H)  05/04/2018   Does the Patient currently have chest pain? No     R Recommendations: See Admitting Provider Note  Report given to:   Additional Notes: Pt is a COVID-19 rule out. Currently on BIpap.  Airborne and Contact precaution.

## 2018-04-20 ENCOUNTER — Encounter (HOSPITAL_COMMUNITY): Payer: Self-pay | Admitting: Internal Medicine

## 2018-04-20 DIAGNOSIS — I4891 Unspecified atrial fibrillation: Secondary | ICD-10-CM

## 2018-04-20 DIAGNOSIS — J441 Chronic obstructive pulmonary disease with (acute) exacerbation: Secondary | ICD-10-CM

## 2018-04-20 DIAGNOSIS — J189 Pneumonia, unspecified organism: Secondary | ICD-10-CM

## 2018-04-20 LAB — CBC WITH DIFFERENTIAL/PLATELET
Abs Immature Granulocytes: 0.03 10*3/uL (ref 0.00–0.07)
Basophils Absolute: 0 10*3/uL (ref 0.0–0.1)
Basophils Relative: 0 %
EOS ABS: 0 10*3/uL (ref 0.0–0.5)
Eosinophils Relative: 0 %
HEMATOCRIT: 34.4 % — AB (ref 39.0–52.0)
Hemoglobin: 11.3 g/dL — ABNORMAL LOW (ref 13.0–17.0)
Immature Granulocytes: 0 %
Lymphocytes Relative: 6 %
Lymphs Abs: 0.5 10*3/uL — ABNORMAL LOW (ref 0.7–4.0)
MCH: 31.9 pg (ref 26.0–34.0)
MCHC: 32.8 g/dL (ref 30.0–36.0)
MCV: 97.2 fL (ref 80.0–100.0)
Monocytes Absolute: 0.4 10*3/uL (ref 0.1–1.0)
Monocytes Relative: 4 %
Neutro Abs: 7.9 10*3/uL — ABNORMAL HIGH (ref 1.7–7.7)
Neutrophils Relative %: 90 %
Platelets: 347 10*3/uL (ref 150–400)
RBC: 3.54 MIL/uL — ABNORMAL LOW (ref 4.22–5.81)
RDW: 14.2 % (ref 11.5–15.5)
WBC: 8.8 10*3/uL (ref 4.0–10.5)
nRBC: 0 % (ref 0.0–0.2)

## 2018-04-20 LAB — EXPECTORATED SPUTUM ASSESSMENT W GRAM STAIN, RFLX TO RESP C

## 2018-04-20 LAB — GLUCOSE, CAPILLARY: Glucose-Capillary: 149 mg/dL — ABNORMAL HIGH (ref 70–99)

## 2018-04-20 LAB — BASIC METABOLIC PANEL
Anion gap: 7 (ref 5–15)
BUN: 19 mg/dL (ref 8–23)
CALCIUM: 10.1 mg/dL (ref 8.9–10.3)
CO2: 28 mmol/L (ref 22–32)
CREATININE: 0.96 mg/dL (ref 0.61–1.24)
Chloride: 108 mmol/L (ref 98–111)
GFR calc Af Amer: >60 mL/min (ref >60)
GFR calc non Af Amer: >60 mL/min (ref >60)
Glucose, Bld: 153 mg/dL — ABNORMAL HIGH (ref 70–99)
Potassium: 3.8 mmol/L (ref 3.5–5.1)
Sodium: 143 mmol/L (ref 135–145)

## 2018-04-20 LAB — HEPATIC FUNCTION PANEL
ALT: 14 U/L (ref 0–44)
AST: 21 U/L (ref 15–41)
Albumin: 2.2 g/dL — ABNORMAL LOW (ref 3.5–5.0)
Alkaline Phosphatase: 47 U/L (ref 38–126)
Bilirubin, Direct: 0.3 mg/dL — ABNORMAL HIGH (ref 0.0–0.2)
Indirect Bilirubin: 0.6 mg/dL (ref 0.3–0.9)
Total Bilirubin: 0.9 mg/dL (ref 0.3–1.2)
Total Protein: 6.4 g/dL — ABNORMAL LOW (ref 6.5–8.1)

## 2018-04-20 LAB — LACTIC ACID, PLASMA: LACTIC ACID, VENOUS: 1.7 mmol/L (ref 0.5–1.9)

## 2018-04-20 LAB — EXPECTORATED SPUTUM ASSESSMENT W REFEX TO RESP CULTURE

## 2018-04-20 LAB — HIV ANTIBODY (ROUTINE TESTING W REFLEX): HIV Screen 4th Generation wRfx: NONREACTIVE

## 2018-04-20 LAB — MRSA PCR SCREENING: MRSA by PCR: NEGATIVE

## 2018-04-20 LAB — STREP PNEUMONIAE URINARY ANTIGEN: Strep Pneumo Urinary Antigen: NEGATIVE

## 2018-04-20 MED ORDER — SODIUM CHLORIDE 0.9 % IV SOLN
1.0000 g | INTRAVENOUS | Status: DC
  Filled 2018-04-20: qty 10

## 2018-04-20 MED ORDER — ACETAMINOPHEN 325 MG PO TABS: 650.0000 mg | ORAL_TABLET | Freq: Four times a day (QID) | ORAL | Status: DC | PRN

## 2018-04-20 MED ORDER — LEVALBUTEROL HCL 0.63 MG/3ML IN NEBU: 0.6300 mg | INHALATION_SOLUTION | Freq: Four times a day (QID) | RESPIRATORY_TRACT | Status: DC | PRN

## 2018-04-20 MED ORDER — GABAPENTIN 300 MG PO CAPS
300.0000 mg | ORAL_CAPSULE | Freq: Three times a day (TID) | ORAL | Status: DC
  Administered 2018-04-20 – 2018-04-22 (×9): 300 mg via ORAL
  Filled 2018-04-20 (×10): qty 1

## 2018-04-20 MED ORDER — MOMETASONE FURO-FORMOTEROL FUM 100-5 MCG/ACT IN AERO
2.0000 | INHALATION_SPRAY | Freq: Two times a day (BID) | RESPIRATORY_TRACT | Status: DC
  Administered 2018-04-20 – 2018-05-02 (×22): 2 via RESPIRATORY_TRACT
  Filled 2018-04-20 (×2): qty 8.8

## 2018-04-20 MED ORDER — LEVALBUTEROL TARTRATE 45 MCG/ACT IN AERO
2.0000 | INHALATION_SPRAY | Freq: Four times a day (QID) | RESPIRATORY_TRACT | Status: DC
  Administered 2018-04-20 – 2018-04-21 (×7): 2 via RESPIRATORY_TRACT
  Filled 2018-04-20: qty 15

## 2018-04-20 MED ORDER — ONDANSETRON HCL 4 MG/2ML IJ SOLN
4.0000 mg | Freq: Four times a day (QID) | INTRAMUSCULAR | Status: DC | PRN
  Administered 2018-04-27: 4 mg via INTRAVENOUS
  Filled 2018-04-20: qty 2

## 2018-04-20 MED ORDER — DILTIAZEM HCL ER COATED BEADS 180 MG PO CP24
180.0000 mg | ORAL_CAPSULE | Freq: Every day | ORAL | Status: DC
  Administered 2018-04-20 – 2018-04-22 (×3): 180 mg via ORAL
  Filled 2018-04-20 (×3): qty 1

## 2018-04-20 MED ORDER — ONDANSETRON HCL 4 MG PO TABS: 4.0000 mg | ORAL_TABLET | Freq: Four times a day (QID) | ORAL | Status: DC | PRN

## 2018-04-20 MED ORDER — MIRABEGRON ER 25 MG PO TB24
50.0000 mg | ORAL_TABLET | Freq: Every day | ORAL | Status: DC
  Administered 2018-04-20 – 2018-05-01 (×7): 50 mg via ORAL
  Filled 2018-04-20 (×3): qty 1
  Filled 2018-04-20 (×3): qty 2
  Filled 2018-04-20 (×3): qty 1
  Filled 2018-04-20: qty 2

## 2018-04-20 MED ORDER — ACETAMINOPHEN 650 MG RE SUPP: 650.0000 mg | Freq: Four times a day (QID) | RECTAL | Status: DC | PRN

## 2018-04-20 MED ORDER — APIXABAN 5 MG PO TABS
5.0000 mg | ORAL_TABLET | Freq: Two times a day (BID) | ORAL | Status: DC
  Administered 2018-04-20 – 2018-04-22 (×6): 5 mg via ORAL
  Filled 2018-04-20 (×9): qty 1

## 2018-04-20 MED ORDER — SODIUM CHLORIDE 0.9 % IV SOLN
INTRAVENOUS | Status: DC | PRN
  Administered 2018-04-20: 450 mL via INTRAVENOUS
  Administered 2018-04-23 – 2018-04-25 (×3): 250 mL via INTRAVENOUS

## 2018-04-20 MED ORDER — TAMSULOSIN HCL 0.4 MG PO CAPS
0.4000 mg | ORAL_CAPSULE | Freq: Every day | ORAL | Status: DC
  Administered 2018-04-20 – 2018-05-01 (×5): 0.4 mg via ORAL
  Filled 2018-04-20 (×10): qty 1

## 2018-04-20 MED ORDER — SODIUM CHLORIDE 0.9 % IV SOLN
1.0000 g | INTRAVENOUS | Status: DC
  Administered 2018-04-20 – 2018-04-22 (×3): 1 g via INTRAVENOUS
  Filled 2018-04-20 (×2): qty 10
  Filled 2018-04-20: qty 1
  Filled 2018-04-20: qty 10

## 2018-04-20 MED ORDER — LEVALBUTEROL TARTRATE 45 MCG/ACT IN AERO: 2.0000 | INHALATION_SPRAY | RESPIRATORY_TRACT | Status: DC | PRN

## 2018-04-20 MED ORDER — BUDESONIDE 0.25 MG/2ML IN SUSP
0.2500 mg | Freq: Two times a day (BID) | RESPIRATORY_TRACT | Status: DC
  Filled 2018-04-20: qty 2

## 2018-04-20 MED ORDER — SODIUM CHLORIDE 0.9 % IV BOLUS
500.0000 mL | Freq: Once | INTRAVENOUS | Status: AC
  Administered 2018-04-20: 500 mL via INTRAVENOUS

## 2018-04-20 MED ORDER — LEVALBUTEROL TARTRATE 45 MCG/ACT IN AERO: 2.0000 | INHALATION_SPRAY | Freq: Four times a day (QID) | RESPIRATORY_TRACT | Status: DC | PRN

## 2018-04-20 MED ORDER — SODIUM CHLORIDE 0.9 % IV SOLN
500.0000 mg | INTRAVENOUS | Status: AC
  Administered 2018-04-20 – 2018-04-24 (×5): 500 mg via INTRAVENOUS
  Filled 2018-04-20 (×5): qty 500

## 2018-04-20 MED ORDER — LEVALBUTEROL HCL 0.63 MG/3ML IN NEBU: 0.6300 mg | INHALATION_SOLUTION | Freq: Four times a day (QID) | RESPIRATORY_TRACT | Status: DC

## 2018-04-20 MED ORDER — PANTOPRAZOLE SODIUM 40 MG PO TBEC
40.0000 mg | DELAYED_RELEASE_TABLET | Freq: Every day | ORAL | Status: DC
  Administered 2018-04-20 – 2018-04-22 (×3): 40 mg via ORAL
  Filled 2018-04-20 (×3): qty 1

## 2018-04-20 NOTE — Progress Notes (Addendum)
[place] TEAM 1 - Stepdown/ICU TEAM  Kurt [name]  PIR:[id number] DOB: 02-Dec-1940 DOA: 04/10/2018 PCP: [name], NP    Brief Narrative:  78yo w/ a hx of COPD, atrial fibrillation, tobacco abuse, and BPH who presented w/ 2 days of shortness of breath.  Patient states he has been having difficulty breathing with wheezing and productive cough for last 2 days.    In the ER he was found to be febrile to 102 F, and in A. fib with RVR. He was hypoxic requiring BiPAP. CXR showed bilateral infiltrates. Influenza panel and respiratory viral panel were negative. Pt confirmed in the ED he wished to be DNR/NCB.  Significant Events: 3/16 admit   Subjective: Pt chart reviewed in f/u. Admitted earlier today by Dr. Hal Hope.   Assessment & Plan:  Acute hypoxic respiratory failure - Pneumonia cont empiric antibiotics for pneumonia - SARS-CoV-2 test pending w/ pt on isolation - risk of Covid-19 appears quite low, but agree that testing is prudent   COPD  Chronic A. fib with acute RVR continue Cardizem and apixaban  BPH on tamsulosin  Ongoing tobacco abuse  DVT prophylaxis: Eliquis Code Status: DNR - NO CODE Family Communication: no family present at time of exam  Disposition Plan: postpone PT/OT evals until SARS-CoV-2 test resulted - if negative can move out of ICU/resp isolation   Consultants:  none  Antimicrobials:  Ceftriaxone 3/15 > Azithro 3/15 >  Objective: Blood pressure 102/74, pulse (!) 107, temperature (!) 96.9 F (36.1 C), temperature source Axillary, resp. rate (!) 22, height 6' (1.829 m), weight 58.9 kg, SpO2 99 %.  Intake/Output Summary (Last 24 hours) at 04/20/2018 0942 Last data filed at 04/20/2018 0900 Gross per 24 hour  Intake 1875.42 ml  Output 125 ml  Net 1750.42 ml   Filed Weights   05/02/2018 1909 04/20/18 0215  Weight: 78 kg 58.9 kg    Examination: No exam as pt admitted earlier today - chart/labs/meds fully reviewed - discussed care w/ RN  CBC: Recent Labs  Lab 04/30/2018 1932 04/18/2018 1949 04/20/18 0406  WBC 9.4  --  8.8  NEUTROABS 8.1*  --  7.9*  HGB 13.1 12.9* 11.3*  HCT 39.7 38.0* 34.4*  MCV 96.8  --  97.2  PLT 420*  --  630   Basic Metabolic Panel: Recent Labs  Lab 04/21/2018 1932 04/11/2018 1949 04/20/18 0406  NA 140 144 143  K 4.0 4.4 3.8  CL 103  --  108  CO2 26  --  28  GLUCOSE 121*  --  153*  BUN 20  --  19  CREATININE 1.27*  --  0.96  CALCIUM 10.3  --  10.1   GFR: Estimated Creatinine Clearance: 52 mL/min (by C-G formula based on SCr of 0.96 mg/dL).  Liver Function Tests: Recent Labs  Lab 04/20/2018 1932 04/20/18 0406  AST 21 21  ALT 14 14  ALKPHOS 52 47  BILITOT 1.5* 0.9  PROT 7.6 6.4*  ALBUMIN 2.7* 2.2*    Cardiac Enzymes: Recent Labs  Lab 04/14/2018 2250  TROPONINI <0.03    Recent Results (from the past 240 hour(s))  Blood culture (routine x 2)     Status: None (Preliminary result)   Collection Time: 04/23/2018  7:15 PM  Result Value Ref Range Status   Specimen Description BLOOD RIGHT ANTECUBITAL  Final   Special Requests   Final    BOTTLES DRAWN AEROBIC AND ANAEROBIC Blood Culture results may not be optimal due to an  inadequate volume of blood received in culture bottles   Culture   Final    NO GROWTH < 24 HOURS Performed at [facility] [address] [place], [place] [address]    Report Status PENDING  Incomplete  Blood culture (routine x 2)     Status: None (Preliminary result)   Collection Time: 04/07/2018  7:35 PM  Result Value Ref Range Status   Specimen Description BLOOD BLOOD RIGHT FOREARM  Final   Special Requests   Final    BOTTLES DRAWN AEROBIC AND ANAEROBIC Blood Culture results may not be optimal due to an inadequate volume of blood received in culture bottles   Culture   Final    NO GROWTH < 24 HOURS Performed at [facility], [place] [address] [place], [place] [address]    Report Status PENDING  Incomplete  Respiratory Panel by PCR     Status: None    Collection Time: 04/26/2018  7:58 PM  Result Value Ref Range Status   Adenovirus NOT DETECTED NOT DETECTED Final   Coronavirus 229E NOT DETECTED NOT DETECTED Final    Comment: (NOTE) The Coronavirus on the Respiratory Panel, DOES NOT test for the novel  Coronavirus (2019 nCoV)    Coronavirus HKU1 NOT DETECTED NOT DETECTED Final   Coronavirus NL63 NOT DETECTED NOT DETECTED Final   Coronavirus OC43 NOT DETECTED NOT DETECTED Final   Metapneumovirus NOT DETECTED NOT DETECTED Final   Rhinovirus / Enterovirus NOT DETECTED NOT DETECTED Final   Influenza A NOT DETECTED NOT DETECTED Final   Influenza B NOT DETECTED NOT DETECTED Final   Parainfluenza Virus 1 NOT DETECTED NOT DETECTED Final   Parainfluenza Virus 2 NOT DETECTED NOT DETECTED Final   Parainfluenza Virus 3 NOT DETECTED NOT DETECTED Final   Parainfluenza Virus 4 NOT DETECTED NOT DETECTED Final   Respiratory Syncytial Virus NOT DETECTED NOT DETECTED Final   Bordetella pertussis NOT DETECTED NOT DETECTED Final   Chlamydophila pneumoniae NOT DETECTED NOT DETECTED Final   Mycoplasma pneumoniae NOT DETECTED NOT DETECTED Final    Comment: Performed at [facility], [address], [place], [place] [address]  Expectorated sputum assessment w rflx to resp cult     Status: None   Collection Time: 04/20/18  4:00 AM  Result Value Ref Range Status   Specimen Description SPUTUM  Final   Special Requests NONE  Final   Sputum evaluation   Final    Sputum specimen not acceptable for testing.  Please recollect.   Gram Stain Report Called to,Read Back By and Verified With: RN K WHITE @ [phone] 04/20/18 BY Tobey Bride Performed at [facility], [place] [address] [place], [place] [address]    Report Status 04/20/2018 FINAL  Final  MRSA PCR Screening     Status: None   Collection Time: 04/20/18  4:05 AM  Result Value Ref Range Status   MRSA by PCR NEGATIVE NEGATIVE Final    Comment:        The GeneXpert MRSA Assay (FDA approved for NASAL  specimens only), is one component of a comprehensive MRSA colonization surveillance program. It is not intended to diagnose MRSA infection nor to guide or monitor treatment for MRSA infections. Performed at [facility], [place] [address] [place], [place] [address]      Scheduled Meds: . apixaban  5 mg Oral BID  . diltiazem  180 mg Oral Daily  . gabapentin  300 mg Oral TID  .  levalbuterol  2 puff Inhalation Q6H  . mirabegron ER  50 mg Oral Daily  . mometasone-formoterol  2 puff Inhalation BID  . pantoprazole  40 mg Oral Daily  . tamsulosin  0.4 mg Oral QPC supper     LOS: 1 day   Cherene Altes, MD [facility] Office  [phone] Pager - Text Page per Amion  If 7PM-7AM, please contact night-coverage per Amion 04/20/2018, 9:42 AM

## 2018-04-20 NOTE — H&P (Signed)
History and Physical    Kurt [name] DJM:[id number] DOB: [birth year] DOA: 04/27/2018  PCP: [name], NP  Patient coming from: Skilled nursing facility.  Chief Complaint: Shortness of breath.  HPI: Kurt [name] is a 78 y.o. male with history of COPD, atrial fibrillation, tobacco abuse, BPH presents to the ER because of increasing shortness of breath which worsened over the last 2 days.  Patient states he has been having difficulty breathing with wheezing and productive cough for last 2 days.  Reports that he had come in contact with some the staff from [place].  Patient denies any chest pain.  Denies any nausea vomiting abdominal pain or diarrhea.  ED Course: In the ER patient is found to be febrile with temperature 102 F initially was in A. fib with RVR.  Hypoxic requiring BiPAP.  Patient was tachypneic tachycardic elevated lactate.  Chest x-ray shows bilateral infiltrates.  Influenza panel and respiratory viral panel were negative.  Given the symptoms and presentation and present coronavirus epidemic ER physician discussed with on-call infectious disease consultant who advised to get coronavirus testing done.  Which is pending at this time.  Patient started on empiric antibiotics for pneumonia was also given methylprednisone and nebulizer treatment.  Patient has requested and confirmed that he is a DNR.  Review of Systems: As per HPI, rest all negative.   Past Medical History:  Diagnosis Date  . Abnormal chest x-ray   . Alcohol abuse   . Anxiety   . Atrial fibrillation ([place])   . Chronic insomnia   . Cigarette smoker   . Colonic polyp   . COPD (chronic obstructive pulmonary disease) ([place])   . Diverticulosis of colon   . DVT (deep venous thrombosis) ([place])   . Fatigue   . GERD (gastroesophageal reflux disease)   . History of pneumonia   . Hypercholesterolemia   . Lumbosacral spondylosis without myelopathy   . Neck pain   . Urinary frequency     Past Surgical  History:  Procedure Laterality Date  . 3 seperate lumbar laminectomies    . right CTS release     Dr. Fredna Dow 12/00     reports that he has been smoking cigarettes. He has a 81.00 pack-year smoking history. He has quit using smokeless tobacco.  His smokeless tobacco use included chew. He reports current alcohol use of about 2.0 standard drinks of alcohol per week. He reports that he does not use drugs.  No Known Allergies  Family History  Problem Relation Age of Onset  . Prostate cancer Father   . Cancer Father        prostate  . Hypertension Sister   . Hypertension Mother     Prior to Admission medications   Medication Sig Start Date End Date Taking? Authorizing Provider  budesonide-formoterol (SYMBICORT) 80-4.5 MCG/ACT inhaler Inhale 2 puffs into the lungs 2 (two) times daily. 03/03/18  Yes Baity, Coralie Keens, NP  diltiazem (CARDIZEM CD) 180 MG 24 hr capsule TAKE 1 CAPSULE EVERY DAY Patient taking differently: Take 180 mg by mouth daily.  01/26/18  Yes Baity, Coralie Keens, NP  ELIQUIS 5 MG TABS tablet TAKE 1 TABLET BY MOUTH TWICE A DAY Patient taking differently: Take 5 mg by mouth 2 (two) times daily.  03/20/18  Yes [name], NP  omeprazole (PRILOSEC) 20 MG capsule Take 1 capsule (20 mg total) by mouth daily. 03/03/18  Yes Baity, Coralie Keens, NP  gabapentin (NEURONTIN) 300 MG capsule TAKE 1  CAPSULE BY MOUTH THREE TIMES A DAY Patient taking differently: Take 300 mg by mouth 3 (three) times daily.  01/20/18   [name], NP  ibuprofen (ADVIL,MOTRIN) 200 MG tablet Per bottle as needed    [provider]  MYRBETRIQ 50 MG TB24 tablet Take 50 mg by mouth daily.  01/17/17   [provider]  tamsulosin (FLOMAX) 0.4 MG CAPS capsule TAKE 1 CAPSULE (0.4 MG TOTAL) BY MOUTH DAILY. 10/21/16   [name], NP  traZODone (DESYREL) 50 MG tablet TAKE 1/2 TO 1 TABLET BY MOUTH AT BEDTIME AS NEEDED FOR SLEEP 01/22/17   [name], NP    Physical Exam: Vitals:   04/18/2018  2315 04/20/18 0015 04/20/18 0100 04/20/18 0215  BP: 110/87 116/82 112/82   Pulse: (!) 122 (!) 109 (!) 110   Resp: (!) 33 (!) 21 20   Temp:      TempSrc:      SpO2: 99% 97% 96%   Weight:    58.9 kg  Height:          Constitutional: Moderately built and nourished. Vitals:   04/29/2018 2315 04/20/18 0015 04/20/18 0100 04/20/18 0215  BP: 110/87 116/82 112/82   Pulse: (!) 122 (!) 109 (!) 110   Resp: (!) 33 (!) 21 20   Temp:      TempSrc:      SpO2: 99% 97% 96%   Weight:    58.9 kg  Height:       Eyes: Anicteric no pallor. ENMT: No discharge from the ears eyes nose or mouth. Neck: No mass felt.  No neck rigidity.  No JVD appreciated. Respiratory: Bilateral air entry present.  No wheezing or crepitations. Cardiovascular: S1-S2 heard. Abdomen: Soft nontender bowel sounds present. Musculoskeletal: No edema.  No joint effusion. Skin: No rash. Neurologic: Alert awake oriented to time place and person.  Moves all extremities. Psychiatric: Appears normal.  Normal affect.   Labs on Admission: I have personally reviewed following labs and imaging studies  CBC: Recent Labs  Lab 04/12/2018 1932 04/27/2018 1949  WBC 9.4  --   NEUTROABS 8.1*  --   HGB 13.1 12.9*  HCT 39.7 38.0*  MCV 96.8  --   PLT 420*  --    Basic Metabolic Panel: Recent Labs  Lab 04/08/2018 1932 04/08/2018 1949  NA 140 144  K 4.0 4.4  CL 103  --   CO2 26  --   GLUCOSE 121*  --   BUN 20  --   CREATININE 1.27*  --   CALCIUM 10.3  --    GFR: Estimated Creatinine Clearance: 39.3 mL/min (A) (by C-G formula based on SCr of 1.27 mg/dL (H)). Liver Function Tests: Recent Labs  Lab 05/05/2018 1932  AST 21  ALT 14  ALKPHOS 52  BILITOT 1.5*  PROT 7.6  ALBUMIN 2.7*   No results for input(s): LIPASE, AMYLASE in the last 168 hours. No results for input(s): AMMONIA in the last 168 hours. Coagulation Profile: No results for input(s): INR, PROTIME in the last 168 hours. Cardiac Enzymes: Recent Labs  Lab 04/18/2018  2250  TROPONINI <0.03   BNP (last 3 results) No results for input(s): PROBNP in the last 8760 hours. HbA1C: No results for input(s): HGBA1C in the last 72 hours. CBG: No results for input(s): GLUCAP in the last 168 hours. Lipid Profile: No results for input(s): CHOL, HDL, LDLCALC, TRIG, CHOLHDL, LDLDIRECT in the last 72 hours. Thyroid Function Tests: No results  for input(s): TSH, T4TOTAL, FREET4, T3FREE, THYROIDAB in the last 72 hours. Anemia Panel: No results for input(s): VITAMINB12, FOLATE, FERRITIN, TIBC, IRON, RETICCTPCT in the last 72 hours. Urine analysis:    Component Value Date/Time   COLORURINE STRAW (A) 01/15/2016 1316   APPEARANCEUR CLEAR 01/15/2016 1316   LABSPEC 1.002 (L) 01/15/2016 1316   PHURINE 6.0 01/15/2016 1316   GLUCOSEU NEGATIVE 01/15/2016 1316   GLUCOSEU NEGATIVE 03/29/2010 1611   HGBUR NEGATIVE 01/15/2016 1316   BILIRUBINUR NEGATIVE 01/15/2016 1316   KETONESUR NEGATIVE 01/15/2016 1316   PROTEINUR NEGATIVE 01/15/2016 1316   UROBILINOGEN 0.2 03/29/2010 1611   NITRITE NEGATIVE 01/15/2016 1316   LEUKOCYTESUR NEGATIVE 01/15/2016 1316   Sepsis Labs: @LABRCNTIP (procalcitonin:4,lacticidven:4) ) Recent Results (from the past 240 hour(s))  Respiratory Panel by PCR     Status: None   Collection Time: 04/10/2018  7:58 PM  Result Value Ref Range Status   Adenovirus NOT DETECTED NOT DETECTED Final   Coronavirus 229E NOT DETECTED NOT DETECTED Final    Comment: (NOTE) The Coronavirus on the Respiratory Panel, DOES NOT test for the novel  Coronavirus (2019 nCoV)    Coronavirus HKU1 NOT DETECTED NOT DETECTED Final   Coronavirus NL63 NOT DETECTED NOT DETECTED Final   Coronavirus OC43 NOT DETECTED NOT DETECTED Final   Metapneumovirus NOT DETECTED NOT DETECTED Final   Rhinovirus / Enterovirus NOT DETECTED NOT DETECTED Final   Influenza A NOT DETECTED NOT DETECTED Final   Influenza B NOT DETECTED NOT DETECTED Final   Parainfluenza Virus 1 NOT DETECTED NOT DETECTED  Final   Parainfluenza Virus 2 NOT DETECTED NOT DETECTED Final   Parainfluenza Virus 3 NOT DETECTED NOT DETECTED Final   Parainfluenza Virus 4 NOT DETECTED NOT DETECTED Final   Respiratory Syncytial Virus NOT DETECTED NOT DETECTED Final   Bordetella pertussis NOT DETECTED NOT DETECTED Final   Chlamydophila pneumoniae NOT DETECTED NOT DETECTED Final   Mycoplasma pneumoniae NOT DETECTED NOT DETECTED Final    Comment: Performed at [facility], [place] [address], [place], [place] [address]     Radiological Exams on Admission: Dg Chest Port 1 View  Result Date: 04/27/2018 CLINICAL DATA:  Shortness of breath, difficulty breathing EXAM: PORTABLE CHEST 1 VIEW COMPARISON:  03/03/2018 FINDINGS: Bilateral lower lobe airspace opacities are noted, worsening since prior study compatible with worsening pneumonia. Mild hyperinflation/COPD. Mild cardiomegaly. No visible significant effusions or acute bony abnormality. IMPRESSION: Hyperinflation/COPD. Worsening bilateral lower lobe airspace opacities concerning for pneumonia. Electronically Signed   By: Rolm Baptise M.D.   On: 04/14/2018 19:31    EKG: Independently reviewed.  A. fib with RVR.  Assessment/Plan Principal Problem:   Acute respiratory failure with hypoxia (HCC) Active Problems:   Atrial fibrillation with RVR (HCC)   COPD exacerbation (HCC)   Community acquired pneumonia    1. Acute respiratory failure with hypoxia with possible developing sepsis secondary pneumonia for which patient has been placed on empiric antibiotics for pneumonia.  Check sputum culture blood cultures and also follow coronavirus COVID 19 test which is pending.  Airborne precautions and contact precautions until then.  Patient's respiratory failure also could be worsened because of his COPD for which patient is on Xopenex inhaler and Dulera.  IV steroids given in the ER.  Further dose may be considered based on the patient's symptoms.  As elevated CRP and d-dimer which  are indicators of poor prognosis if coronavirus COVID 19 turns out to be positive. 2. A. fib with RVR is relatively improved with  this time.  Continue Cardizem and apixaban. 3. BPH is on tamsulosin. 4. Ongoing tobacco abuse -advised about quitting.   DVT prophylaxis: Apixaban. Code Status: DNR. Family Communication: No family at the bedside. Disposition Plan: Back to facility when stable. Consults called: None. Admission status: Inpatient.   Rise Patience MD [facility] Pager [phone].  If 7PM-7AM, please contact night-coverage [email or web address] Password [email or web address]  04/20/2018, 2:30 AM

## 2018-04-21 DIAGNOSIS — L8990 Pressure ulcer of unspecified site, unspecified stage: Secondary | ICD-10-CM

## 2018-04-21 LAB — COMPREHENSIVE METABOLIC PANEL
ALT: 18 U/L (ref 0–44)
AST: 25 U/L (ref 15–41)
Albumin: 2 g/dL — ABNORMAL LOW (ref 3.5–5.0)
Alkaline Phosphatase: 40 U/L (ref 38–126)
Anion gap: 8 (ref 5–15)
BUN: 17 mg/dL (ref 8–23)
CHLORIDE: 109 mmol/L (ref 98–111)
CO2: 27 mmol/L (ref 22–32)
Calcium: 9.9 mg/dL (ref 8.9–10.3)
Creatinine, Ser: 0.78 mg/dL (ref 0.61–1.24)
GFR calc Af Amer: >60 mL/min (ref >60)
Glucose, Bld: 130 mg/dL — ABNORMAL HIGH (ref 70–99)
Potassium: 3.9 mmol/L (ref 3.5–5.1)
Sodium: 144 mmol/L (ref 135–145)
Total Bilirubin: 0.6 mg/dL (ref 0.3–1.2)
Total Protein: 5.8 g/dL — ABNORMAL LOW (ref 6.5–8.1)

## 2018-04-21 LAB — RETICULOCYTES
Immature Retic Fract: 10.3 % (ref 2.3–15.9)
RBC.: 3.25 MIL/uL — ABNORMAL LOW (ref 4.22–5.81)
Retic Count, Absolute: 34.8 10*3/uL (ref 19.0–186.0)
Retic Ct Pct: 1.1 % (ref 0.4–3.1)

## 2018-04-21 LAB — URINE CULTURE: CULTURE: NO GROWTH

## 2018-04-21 LAB — CBC
HCT: 31.4 % — ABNORMAL LOW (ref 39.0–52.0)
Hemoglobin: 10.3 g/dL — ABNORMAL LOW (ref 13.0–17.0)
MCH: 31.7 pg (ref 26.0–34.0)
MCHC: 32.8 g/dL (ref 30.0–36.0)
MCV: 96.6 fL (ref 80.0–100.0)
Platelets: 287 10*3/uL (ref 150–400)
RBC: 3.25 MIL/uL — ABNORMAL LOW (ref 4.22–5.81)
RDW: 14.3 % (ref 11.5–15.5)
WBC: 11.9 10*3/uL — ABNORMAL HIGH (ref 4.0–10.5)
nRBC: 0 % (ref 0.0–0.2)

## 2018-04-21 LAB — IRON AND TIBC
IRON: 19 ug/dL — AB (ref 45–182)
Saturation Ratios: 12 % — ABNORMAL LOW (ref 17.9–39.5)
TIBC: 165 ug/dL — ABNORMAL LOW (ref 250–450)
UIBC: 146 ug/dL

## 2018-04-21 LAB — LEGIONELLA PNEUMOPHILA SEROGP 1 UR AG: L. PNEUMOPHILA SEROGP 1 UR AG: NEGATIVE

## 2018-04-21 LAB — MAGNESIUM: Magnesium: 2.1 mg/dL (ref 1.7–2.4)

## 2018-04-21 LAB — VITAMIN B12: Vitamin B-12: 674 pg/mL (ref 180–914)

## 2018-04-21 LAB — PHOSPHORUS: Phosphorus: 2.9 mg/dL (ref 2.5–4.6)

## 2018-04-21 LAB — FOLATE: Folate: 9.5 ng/mL (ref >5.9)

## 2018-04-21 LAB — FERRITIN: Ferritin: 232 ng/mL (ref 24–336)

## 2018-04-21 MED ORDER — KCL IN DEXTROSE-NACL 20-5-0.9 MEQ/L-%-% IV SOLN: INTRAVENOUS | Status: DC

## 2018-04-21 MED ORDER — ORAL CARE MOUTH RINSE
15.0000 mL | Freq: Two times a day (BID) | OROMUCOSAL | Status: DC
  Administered 2018-04-21 – 2018-05-02 (×17): 15 mL via OROMUCOSAL

## 2018-04-21 MED ORDER — CHLORHEXIDINE GLUCONATE CLOTH 2 % EX PADS
6.0000 | MEDICATED_PAD | Freq: Every day | CUTANEOUS | Status: DC
  Administered 2018-04-21 – 2018-05-02 (×10): 6 via TOPICAL

## 2018-04-21 MED ORDER — METRONIDAZOLE IN NACL 5-0.79 MG/ML-% IV SOLN
500.0000 mg | Freq: Three times a day (TID) | INTRAVENOUS | Status: DC
  Administered 2018-04-21 – 2018-04-23 (×6): 500 mg via INTRAVENOUS
  Filled 2018-04-21 (×9): qty 100

## 2018-04-21 MED ORDER — DEXTROSE-NACL 5-0.45 % IV SOLN
INTRAVENOUS | Status: DC
  Administered 2018-04-21 (×2): via INTRAVENOUS
  Administered 2018-04-22: 1000 mL via INTRAVENOUS
  Administered 2018-04-23 – 2018-04-24 (×2): via INTRAVENOUS

## 2018-04-21 MED ORDER — DEXTROSE-NACL 5-0.45 % IV SOLN: INTRAVENOUS | Status: DC

## 2018-04-21 MED ORDER — ORAL CARE MOUTH RINSE
15.0000 mL | Freq: Two times a day (BID) | OROMUCOSAL | Status: DC
  Administered 2018-04-21: 15 mL via OROMUCOSAL

## 2018-04-21 MED ORDER — CHLORHEXIDINE GLUCONATE 0.12 % MT SOLN
15.0000 mL | Freq: Two times a day (BID) | OROMUCOSAL | Status: DC
  Administered 2018-04-21 – 2018-05-01 (×19): 15 mL via OROMUCOSAL
  Filled 2018-04-21 (×15): qty 15

## 2018-04-21 MED ORDER — LEVALBUTEROL TARTRATE 45 MCG/ACT IN AERO
2.0000 | INHALATION_SPRAY | Freq: Three times a day (TID) | RESPIRATORY_TRACT | Status: DC
  Administered 2018-04-21 – 2018-04-24 (×8): 2 via RESPIRATORY_TRACT
  Filled 2018-04-21: qty 15

## 2018-04-21 NOTE — Progress Notes (Signed)
[facility] p[facility]. Pt increasingly drowsy. RR 35-40. On HFNC at 10. History of COPD

## 2018-04-21 NOTE — Progress Notes (Signed)
[place] TEAM 1 - Stepdown/ICU TEAM  Kurt [name]  RFF:[id number] DOB: 20-Mar-1940 DOA: 04/20/2018 PCP: [name], NP    Brief Narrative:  78yo w/ a hx of COPD, atrial fibrillation, tobacco abuse, and BPH who presented w/ 2 days of shortness of breath.  Patient states he has been having difficulty breathing with wheezing and productive cough for last 2 days.    In the ER he was found to be febrile to 102 F, and in A. fib with RVR. He was hypoxic requiring BiPAP. CXR showed bilateral infiltrates. Influenza panel and respiratory viral panel were negative. Pt confirmed in the ED he wished to be DNR/NCB.  Significant Events: 3/16 admit   Subjective: Pt has been more sedate over night, and into this morning. I suspect he is retaining CO2. I have considered an ABG to evaluate for this, but he currently has a Coronavirus test pending. There is evidence that BIPAP may accelerate ARDS in a pt w/ COVID-19, and there is also grave concern of increase transmission of Coronavirus w/ BIPAP use, and therefore it is essentially contraindicated at this time. In that BIPAP is not an option (nor is intubation as he is DNR), I have chosen not to check an ABG for now. If he Coronavirus test returns negative we can then give him a trial on BIPAP.   Pt is actually alert and conversant at the time of my exam. He denies cp, n/v, or abdom pain. He is able to tell me he is feeling a little better, but remains SOB.   Assessment & Plan:  Acute hypoxic respiratory failure - Pneumonia cont empiric antibiotics for pneumonia - SARS-CoV-2 test pending w/ pt on isolation - risk of Covid-19 appears quite low, but agree that testing is prudent  - given exceedingly poor dental health, will add coverage for possible aspiration   COPD No wheezing appreciable on exam at time of exam  Chronic A. fib with acute RVR continue Cardizem and apixaban - rate controlled   BPH on tamsulosin  Ongoing tobacco abuse  DVT  prophylaxis: Eliquis Code Status: DNR - NO CODE Family Communication: no family present at time of exam  Disposition Plan: postpone PT/OT evals until SARS-CoV-2 test resulted - if negative can move out of ICU/resp isolation   Consultants:  none  Antimicrobials:  Ceftriaxone 3/15 > Azithro 3/15 > Flagyl 3/17 >  Objective: Blood pressure (!) 88/57, pulse 84, temperature 97.6 F (36.4 C), temperature source Oral, resp. rate (!) 32, height 6' (1.829 m), weight 61.3 kg, SpO2 97 %.  Intake/Output Summary (Last 24 hours) at 04/21/2018 0940 Last data filed at 04/21/2018 0800 Gross per 24 hour  Intake 1360.01 ml  Output 700 ml  Net 660.01 ml   Filed Weights   04/09/2018 1909 04/20/18 0215 04/21/18 0454  Weight: 78 kg 58.9 kg 61.3 kg    Examination: General: No acute respiratory distress Lungs: poor air movement th/o all fields - no wheezing - mild basilar crackles  Cardiovascular: Regular rate w/o M or rub  Abdomen: Nontender, nondistended, soft, bowel sounds positive, no rebound, no ascites, no appreciable mass Extremities: No significant cyanosis, clubbing, or edema bilateral lower extremities   CBC: Recent Labs  Lab 05/04/2018 1932 05/02/2018 1949 04/20/18 0406 04/21/18 0230  WBC 9.4  --  8.8 11.9*  NEUTROABS 8.1*  --  7.9*  --   HGB 13.1 12.9* 11.3* 10.3*  HCT 39.7 38.0* 34.4* 31.4*  MCV 96.8  --  97.2 96.6  PLT 420*  --  347 191   Basic Metabolic Panel: Recent Labs  Lab 04/27/2018 1932 05/02/2018 1949 04/20/18 0406 04/21/18 0230  NA 140 144 143 144  K 4.0 4.4 3.8 3.9  CL 103  --  108 109  CO2 26  --  28 27  GLUCOSE 121*  --  153* 130*  BUN 20  --  19 17  CREATININE 1.27*  --  0.96 0.78  CALCIUM 10.3  --  10.1 9.9  MG  --   --   --  2.1  PHOS  --   --   --  2.9   GFR: Estimated Creatinine Clearance: 64.9 mL/min (by C-G formula based on SCr of 0.78 mg/dL).  Liver Function Tests: Recent Labs  Lab 04/29/2018 1932 04/20/18 0406 04/21/18 0230  AST 21 21 25   ALT  14 14 18   ALKPHOS 52 47 40  BILITOT 1.5* 0.9 0.6  PROT 7.6 6.4* 5.8*  ALBUMIN 2.7* 2.2* 2.0*    Cardiac Enzymes: Recent Labs  Lab 04/28/2018 2250  TROPONINI <0.03    Recent Results (from the past 240 hour(s))  Blood culture (routine x 2)     Status: None (Preliminary result)   Collection Time: 04/05/2018  7:15 PM  Result Value Ref Range Status   Specimen Description BLOOD RIGHT ANTECUBITAL  Final   Special Requests   Final    BOTTLES DRAWN AEROBIC AND ANAEROBIC Blood Culture results may not be optimal due to an inadequate volume of blood received in culture bottles   Culture   Final    NO GROWTH < 24 HOURS Performed at [facility], [place] [address], [place], [place] [address]    Report Status PENDING  Incomplete  Blood culture (routine x 2)     Status: None (Preliminary result)   Collection Time: 04/30/2018  7:35 PM  Result Value Ref Range Status   Specimen Description BLOOD BLOOD RIGHT FOREARM  Final   Special Requests   Final    BOTTLES DRAWN AEROBIC AND ANAEROBIC Blood Culture results may not be optimal due to an inadequate volume of blood received in culture bottles   Culture   Final    NO GROWTH < 24 HOURS Performed at [facility], [place] [address], [place], [place] [address]    Report Status PENDING  Incomplete  Respiratory Panel by PCR     Status: None   Collection Time: 05/02/2018  7:58 PM  Result Value Ref Range Status   Adenovirus NOT DETECTED NOT DETECTED Final   Coronavirus 229E NOT DETECTED NOT DETECTED Final    Comment: (NOTE) The Coronavirus on the Respiratory Panel, DOES NOT test for the novel  Coronavirus (2019 nCoV)    Coronavirus HKU1 NOT DETECTED NOT DETECTED Final   Coronavirus NL63 NOT DETECTED NOT DETECTED Final   Coronavirus OC43 NOT DETECTED NOT DETECTED Final   Metapneumovirus NOT DETECTED NOT DETECTED Final   Rhinovirus / Enterovirus NOT DETECTED NOT DETECTED Final   Influenza A NOT DETECTED NOT DETECTED Final   Influenza B NOT DETECTED  NOT DETECTED Final   Parainfluenza Virus 1 NOT DETECTED NOT DETECTED Final   Parainfluenza Virus 2 NOT DETECTED NOT DETECTED Final   Parainfluenza Virus 3 NOT DETECTED NOT DETECTED Final   Parainfluenza Virus 4 NOT DETECTED NOT DETECTED Final   Respiratory Syncytial Virus NOT DETECTED NOT DETECTED Final   Bordetella pertussis NOT DETECTED NOT DETECTED Final   Chlamydophila pneumoniae NOT DETECTED NOT DETECTED  Final   Mycoplasma pneumoniae NOT DETECTED NOT DETECTED Final    Comment: Performed at [facility], [place] [address] [place], [place] [address]  Urine culture     Status: None   Collection Time: 04/20/18  3:55 AM  Result Value Ref Range Status   Specimen Description URINE, RANDOM  Final   Special Requests NONE  Final   Culture   Final    NO GROWTH Performed at [facility], [place] [address], [place], [place] [address]    Report Status 04/21/2018 FINAL  Final  Expectorated sputum assessment w rflx to resp cult     Status: None   Collection Time: 04/20/18  4:00 AM  Result Value Ref Range Status   Specimen Description SPUTUM  Final   Special Requests NONE  Final   Sputum evaluation   Final    Sputum specimen not acceptable for testing.  Please recollect.   Gram Stain Report Called to,Read Back By and Verified With: RN K WHITE @ [phone] 04/20/18 BY Tobey Bride Performed at [facility], [place] [address], [place], [place] [address]    Report Status 04/20/2018 FINAL  Final  MRSA PCR Screening     Status: None   Collection Time: 04/20/18  4:05 AM  Result Value Ref Range Status   MRSA by PCR NEGATIVE NEGATIVE Final    Comment:        The GeneXpert MRSA Assay (FDA approved for NASAL specimens only), is one component of a comprehensive MRSA colonization surveillance program. It is not intended to diagnose MRSA infection nor to guide or monitor treatment for MRSA infections. Performed at [facility], [place] [address], [place], [place] [address]   Expectorated  sputum assessment w rflx to resp cult     Status: None   Collection Time: 04/20/18 11:04 AM  Result Value Ref Range Status   Specimen Description SPUTUM  Final   Special Requests NONE  Final   Sputum evaluation   Final    THIS SPECIMEN IS ACCEPTABLE FOR SPUTUM CULTURE Performed at [facility], [place] [address], [place], [place] [address]    Report Status 04/20/2018 FINAL  Final  Culture, respiratory     Status: None (Preliminary result)   Collection Time: 04/20/18 11:04 AM  Result Value Ref Range Status   Specimen Description SPUTUM  Final   Special Requests NONE Reflexed from [id number]  Final   Gram Stain   Final    FEW WBC PRESENT, PREDOMINANTLY PMN FEW GRAM POSITIVE COCCI RARE YEAST Performed at [facility], [place] [address] [place], [place] [address]    Culture PENDING  Incomplete   Report Status PENDING  Incomplete     Scheduled Meds: . apixaban  5 mg Oral BID  . diltiazem  180 mg Oral Daily  . gabapentin  300 mg Oral TID  . levalbuterol  2 puff Inhalation Q6H  . mouth rinse  15 mL Mouth Rinse BID  . mirabegron ER  50 mg Oral Daily  . mometasone-formoterol  2 puff Inhalation BID  . pantoprazole  40 mg Oral Daily  . tamsulosin  0.4 mg Oral QPC supper     LOS: 2 days   Cherene Altes, MD [facility] Office  [phone] Pager - Text Page per Amion  If 7PM-7AM, please contact night-coverage per Amion 04/21/2018, 9:40 AM

## 2018-04-21 NOTE — Progress Notes (Signed)
SLP Cancellation Note  Patient Details Name: ALISHA BURGO MRN: [id number] DOB: December 21, 1940   Cancelled treatment:       Reason Eval/Treat Not Completed: Medical issues which prohibited therapy. Discussed pt with RN - he is awaiting ABG results to see if he will need to go on BiPAP. His mentation is altered (oriented x1) and he has been made NPO for now. Will f/u depending on whether or not he needs BiPAP and if his alertness improves.   Venita Sheffield Laretta Pyatt 04/21/2018, 9:40 AM  Pollyann Glen, M.A. [place] Acute Environmental education officer [phone] Office [phone]

## 2018-04-21 NOTE — Evaluation (Signed)
Clinical/Bedside Swallow Evaluation Patient Details  Name: Kurt [name] MRN: [id number] Date of Birth: April 09, 1940  Today's Date: 04/21/2018 Time: SLP Start Time (ACUTE ONLY): 6644 SLP Stop Time (ACUTE ONLY): 1408 SLP Time Calculation (min) (ACUTE ONLY): 14 min  Past Medical History:  Past Medical History:  Diagnosis Date  . Abnormal chest x-ray   . Alcohol abuse   . Anxiety   . Atrial fibrillation ([place])   . Chronic insomnia   . Cigarette smoker   . Colonic polyp   . COPD (chronic obstructive pulmonary disease) ([place])   . Diverticulosis of colon   . DVT (deep venous thrombosis) ([place])   . Fatigue   . GERD (gastroesophageal reflux disease)   . History of pneumonia   . Hypercholesterolemia   . Lumbosacral spondylosis without myelopathy   . Neck pain   . Urinary frequency    Past Surgical History:  Past Surgical History:  Procedure Laterality Date  . 3 seperate lumbar laminectomies    . right CTS release     Dr. Fredna Dow 12/00   HPI:  78yo w/ a hx of GERD, COPD, PNA, atrial fibrillation, tobacco abuse, and BPH who presented w/ 2 days of shortness of breath.  CXR concerning for PNA in the bilateral bases. COVID-19 results pending. Pt had a prior MBS in 2011 but results are not available.   Assessment / Plan / Recommendation Clinical Impression  Pt has immediate coughing with thin liquids that is concerning for aspiration. Although this is not observed with purees and ice chips, pt also reports other symptoms that are concerning for a bigger dysphagia. He shares that he has had changes in his swallowing and voice over the last several months, and he is almost completely aphonic today. He also reports pain in his throat and ear that started around the same time. MD notified of these symptoms, as they could be suggestive of an underlying disease process. Pt would benefit from swallow study prior to initiating more than meds in puree and a few ice chips after oral care. Will f/u for a  little more respriatory stability prior to proceeding. Pt may also benefit from ENT consult to address aforementioned symptoms. SLP Visit Diagnosis: Dysphagia, unspecified (R13.10)    Aspiration Risk  Moderate aspiration risk    Diet Recommendation NPO except meds;Ice chips PRN after oral care(few ice chips at a time, given by RN after oral care)   Medication Administration: Whole meds with puree(crush if needed)    Other  Recommendations Oral Care Recommendations: Oral care QID   Follow up Recommendations (tba)      Frequency and Duration min 2x/week  2 weeks       Prognosis Prognosis for Safe Diet Advancement: Fair Barriers to Reach Goals: Other (Comment)(question underlying etiology)      Swallow Study   General HPI: 78yo w/ a hx of GERD, COPD, PNA, atrial fibrillation, tobacco abuse, and BPH who presented w/ 2 days of shortness of breath.  CXR concerning for PNA in the bilateral bases. COVID-19 results pending. Pt had a prior MBS in 2011 but results are not available. Type of Study: Bedside Swallow Evaluation Previous Swallow Assessment: see HPI Diet Prior to this Study: NPO Temperature Spikes Noted: No Respiratory Status: Nasal cannula History of Recent Intubation: No Behavior/Cognition: Alert;Cooperative Oral Cavity Assessment: Dry Oral Care Completed by SLP: No Oral Cavity - Dentition: Poor condition Vision: Functional for self-feeding Self-Feeding Abilities: Able to feed self Patient Positioning: Upright in bed Baseline Vocal  Quality: Aphonic;Hoarse Volitional Cough: Weak Volitional Swallow: Able to elicit    Oral/Motor/Sensory Function     Ice Chips Ice chips: Within functional limits Presentation: Spoon   Thin Liquid Thin Liquid: Impaired Presentation: Cup;Self Fed Pharyngeal  Phase Impairments: Cough - Immediate    Nectar Thick Nectar Thick Liquid: Not tested   Honey Thick Honey Thick Liquid: Not tested   Puree Puree: Within functional  limits Presentation: Self Fed;Spoon   Solid     Solid: Not tested      Venita Sheffield Jaelynn Pozo 04/21/2018,2:51 PM   Pollyann Glen, M.A. [place] Acute Environmental education officer [phone] Office [phone]

## 2018-04-21 NOTE — Progress Notes (Signed)
PT Cancellation Note  Patient Details Name: Kurt [name] MRN: [id number] DOB: 02-May-1940   Cancelled Treatment:    Reason Eval/Treat Not Completed: Other (comment).  Pt is not medically ready and Dr. Garen Lah note states "postpone PT/OT assessments until COVID-19 tests return negative".    Thanks,  Kurt [name]. Kurt [name], PT, DPT  Acute Rehabilitation [phone] pager [phone]) [phone] office     Wells Guiles B Tashawn Laswell 04/21/2018, 12:11 PM

## 2018-04-22 LAB — BASIC METABOLIC PANEL
Anion gap: 5 (ref 5–15)
BUN: 8 mg/dL (ref 8–23)
CO2: 33 mmol/L — ABNORMAL HIGH (ref 22–32)
Calcium: 9.3 mg/dL (ref 8.9–10.3)
Chloride: 106 mmol/L (ref 98–111)
Creatinine, Ser: 0.73 mg/dL (ref 0.61–1.24)
GFR calc Af Amer: >60 mL/min (ref >60)
GFR calc non Af Amer: >60 mL/min (ref >60)
Glucose, Bld: 113 mg/dL — ABNORMAL HIGH (ref 70–99)
Potassium: 3.4 mmol/L — ABNORMAL LOW (ref 3.5–5.1)
SODIUM: 144 mmol/L (ref 135–145)

## 2018-04-22 LAB — CBC
HCT: 31 % — ABNORMAL LOW (ref 39.0–52.0)
Hemoglobin: 9.8 g/dL — ABNORMAL LOW (ref 13.0–17.0)
MCH: 30.7 pg (ref 26.0–34.0)
MCHC: 31.6 g/dL (ref 30.0–36.0)
MCV: 97.2 fL (ref 80.0–100.0)
Platelets: 279 10*3/uL (ref 150–400)
RBC: 3.19 MIL/uL — ABNORMAL LOW (ref 4.22–5.81)
RDW: 14.1 % (ref 11.5–15.5)
WBC: 7.8 10*3/uL (ref 4.0–10.5)
nRBC: 0 % (ref 0.0–0.2)

## 2018-04-22 LAB — CULTURE, RESPIRATORY W GRAM STAIN: Culture: NORMAL

## 2018-04-22 MED ORDER — WHITE PETROLATUM EX OINT
TOPICAL_OINTMENT | CUTANEOUS | Status: AC
  Administered 2018-04-22: 0.2
  Filled 2018-04-22: qty 28.35

## 2018-04-22 MED ORDER — DILTIAZEM HCL ER COATED BEADS 120 MG PO CP24
120.0000 mg | ORAL_CAPSULE | Freq: Every day | ORAL | Status: DC
  Filled 2018-04-22 (×2): qty 1

## 2018-04-22 NOTE — Progress Notes (Signed)
Patient was in bed with bed alarm on highest setting, yellow socks on, floor mats in place. Patient found in the chair upon entering patients room. Patient had been incontinent of stool. Patient had removed both peripheral IV's. Patient states he is "home and was going to the bathroom." Patient cleaned and placed back in bad with assistance from [employer]. Reinforced need to stay in bed with patient. Will continue to closely monitor.

## 2018-04-22 NOTE — Progress Notes (Signed)
PT Cancellation Note  Patient Details Name: Kurt [name] MRN: [id number] DOB: 1940/02/15   Cancelled Treatment:    Reason Eval/Treat Not Completed: Patient not medically ready(pre MD postpone PT/OT evals until SARS-CoV-2 test resulted )   Duncan Dull 04/22/2018, 12:44 PM Alben Deeds, PT DPT  Board Certified Neurologic Specialist Acute Rehabilitation Services Pager [phone] Office [phone]

## 2018-04-22 NOTE — Progress Notes (Signed)
[facility] TEAM 1 - Stepdown/ICU TEAM  CRISTOFER YAFFE  JKD:[id number] DOB: May 24, 1940 DOA: 04/13/2018 PCP: [name], NP    Brief Narrative:  78yo w/ a hx of COPD, atrial fibrillation, tobacco abuse, and BPH who presented w/ 2 days of shortness of breath.  Patient states he has been having difficulty breathing with wheezing and productive cough for last 2 days.    In the ER he was found to be febrile to 102 F, and in A. fib with RVR. He was hypoxic requiring BiPAP. CXR showed bilateral infiltrates. Influenza panel and respiratory viral panel were negative. Pt confirmed in the ED he wished to be DNR/NCB.  Significant Events: 3/16 admit   Subjective:  Awake alert no distress No fever no chilsl no n/v   Assessment & Plan:  Acute hypoxic respiratory failure - Pneumonia cont empiric antibiotics for pneumonia - SARS-CoV-2 test pending w/ pt on isolation - risk of Covid-19 appears quite low, but agree that testing is prudent  - given exceedingly poor dental health, will add coverage for possible aspiration  Will folow up with CXR in am  COPD No wheezing appreciable on exam at time of exam  Chronic A. fib with acute RVR continue Cardizem, cut back dsoe to 120 CD and apixaban - rate controlled   BPH on tamsulosin  Ongoing tobacco abuse  DVT prophylaxis: Eliquis Code Status: DNR - NO CODE Family Communication: no family present at time of exam  Disposition Plan: postpone PT/OT evals until SARS-CoV-2 test resulted - if negative can move out of ICU/resp isolation   Consultants:  none  Antimicrobials:  Ceftriaxone 3/15 > Azithro 3/15 > Flagyl 3/17 >  Objective: Blood pressure 96/62, pulse 86, temperature 97.9 F (36.6 C), temperature source Oral, resp. rate 17, height 6' (1.829 m), weight 61.5 kg, SpO2 99 %.  Intake/Output Summary (Last 24 hours) at 04/22/2018 1011 Last data filed at 04/22/2018 1000 Gross per 24 hour  Intake 2445.16 ml  Output 500 ml  Net 1945.16 ml    Filed Weights   04/20/18 0215 04/21/18 0454 04/22/18 0343  Weight: 58.9 kg 61.3 kg 61.5 kg    Examination:  Awake pleasant hoarse voice  No ict no pallor cta b no added sound abd soft No le edema Stage 2 decubitus Neuro intact   CBC: Recent Labs  Lab 05/04/2018 1932  04/20/18 0406 04/21/18 0230 04/22/18 0516  WBC 9.4  --  8.8 11.9* 7.8  NEUTROABS 8.1*  --  7.9*  --   --   HGB 13.1   < > 11.3* 10.3* 9.8*  HCT 39.7   < > 34.4* 31.4* 31.0*  MCV 96.8  --  97.2 96.6 97.2  PLT 420*  --  347 287 279   < > = values in this interval not displayed.   Basic Metabolic Panel: Recent Labs  Lab 04/20/18 0406 04/21/18 0230 04/22/18 0516  NA 143 144 144  K 3.8 3.9 3.4*  CL 108 109 106  CO2 28 27 33*  GLUCOSE 153* 130* 113*  BUN 19 17 8   CREATININE 0.96 0.78 0.73  CALCIUM 10.1 9.9 9.3  MG  --  2.1  --   PHOS  --  2.9  --    GFR: Estimated Creatinine Clearance: 65.1 mL/min (by C-G formula based on SCr of 0.73 mg/dL).  Liver Function Tests: Recent Labs  Lab 04/17/2018 1932 04/20/18 0406 04/21/18 0230  AST 21 21 25   ALT 14 14 18   ALKPHOS  52 47 40  BILITOT 1.5* 0.9 0.6  PROT 7.6 6.4* 5.8*  ALBUMIN 2.7* 2.2* 2.0*    Cardiac Enzymes: Recent Labs  Lab 04/25/2018 2250  TROPONINI <0.03    Recent Results (from the past 240 hour(s))  Blood culture (routine x 2)     Status: None (Preliminary result)   Collection Time: 04/14/2018  7:15 PM  Result Value Ref Range Status   Specimen Description BLOOD RIGHT ANTECUBITAL  Final   Special Requests   Final    BOTTLES DRAWN AEROBIC AND ANAEROBIC Blood Culture results may not be optimal due to an inadequate volume of blood received in culture bottles   Culture   Final    NO GROWTH 2 DAYS Performed at [facility] [address] [place], [place] [address]    Report Status PENDING  Incomplete  Blood culture (routine x 2)     Status: None (Preliminary result)   Collection Time: 04/18/2018  7:35 PM  Result Value Ref Range  Status   Specimen Description BLOOD BLOOD RIGHT FOREARM  Final   Special Requests   Final    BOTTLES DRAWN AEROBIC AND ANAEROBIC Blood Culture results may not be optimal due to an inadequate volume of blood received in culture bottles   Culture   Final    NO GROWTH 2 DAYS Performed at [facility], [place] [address], [place], [place] [address]    Report Status PENDING  Incomplete  Respiratory Panel by PCR     Status: None   Collection Time: 04/18/2018  7:58 PM  Result Value Ref Range Status   Adenovirus NOT DETECTED NOT DETECTED Final   Coronavirus 229E NOT DETECTED NOT DETECTED Final    Comment: (NOTE) The Coronavirus on the Respiratory Panel, DOES NOT test for the novel  Coronavirus (2019 nCoV)    Coronavirus HKU1 NOT DETECTED NOT DETECTED Final   Coronavirus NL63 NOT DETECTED NOT DETECTED Final   Coronavirus OC43 NOT DETECTED NOT DETECTED Final   Metapneumovirus NOT DETECTED NOT DETECTED Final   Rhinovirus / Enterovirus NOT DETECTED NOT DETECTED Final   Influenza A NOT DETECTED NOT DETECTED Final   Influenza B NOT DETECTED NOT DETECTED Final   Parainfluenza Virus 1 NOT DETECTED NOT DETECTED Final   Parainfluenza Virus 2 NOT DETECTED NOT DETECTED Final   Parainfluenza Virus 3 NOT DETECTED NOT DETECTED Final   Parainfluenza Virus 4 NOT DETECTED NOT DETECTED Final   Respiratory Syncytial Virus NOT DETECTED NOT DETECTED Final   Bordetella pertussis NOT DETECTED NOT DETECTED Final   Chlamydophila pneumoniae NOT DETECTED NOT DETECTED Final   Mycoplasma pneumoniae NOT DETECTED NOT DETECTED Final    Comment: Performed at [facility], [address] [place], [place] [address]  Urine culture     Status: None   Collection Time: 04/20/18  3:55 AM  Result Value Ref Range Status   Specimen Description URINE, RANDOM  Final   Special Requests NONE  Final   Culture   Final    NO GROWTH Performed at [facility], [place] [address] [place],  [address]    Report Status  04/21/2018 FINAL  Final  Expectorated sputum assessment w rflx to resp cult     Status: None   Collection Time: 04/20/18  4:00 AM  Result Value Ref Range Status   Specimen Description SPUTUM  Final   Special Requests NONE  Final   Sputum evaluation   Final    Sputum specimen  not acceptable for testing.  Please recollect.   Gram Stain Report Called to,Read Back By and Verified With: RN K WHITE @ [phone] 04/20/18 BY Tobey Bride Performed at [facility], [place] [address], [place], [place] [address]    Report Status 04/20/2018 FINAL  Final  MRSA PCR Screening     Status: None   Collection Time: 04/20/18  4:05 AM  Result Value Ref Range Status   MRSA by PCR NEGATIVE NEGATIVE Final    Comment:        The GeneXpert MRSA Assay (FDA approved for NASAL specimens only), is one component of a comprehensive MRSA colonization surveillance program. It is not intended to diagnose MRSA infection nor to guide or monitor treatment for MRSA infections. Performed at [facility], [place] [address], [place], [place] [address]   Expectorated sputum assessment w rflx to resp cult     Status: None   Collection Time: 04/20/18 11:04 AM  Result Value Ref Range Status   Specimen Description SPUTUM  Final   Special Requests NONE  Final   Sputum evaluation   Final    THIS SPECIMEN IS ACCEPTABLE FOR SPUTUM CULTURE Performed at [facility], [place] [address] [place], [place] [address]    Report Status 04/20/2018 FINAL  Final  Culture, respiratory     Status: None   Collection Time: 04/20/18 11:04 AM  Result Value Ref Range Status   Specimen Description SPUTUM  Final   Special Requests NONE Reflexed from [id number]  Final   Gram Stain   Final    FEW WBC PRESENT, PREDOMINANTLY PMN FEW GRAM POSITIVE COCCI RARE YEAST    Culture   Final    FEW Consistent with normal respiratory flora. Performed at [facility], [place] [address], [place], [place] [address]    Report Status 04/22/2018 FINAL  Final      Scheduled Meds: . apixaban  5 mg Oral BID  . chlorhexidine  15 mL Mouth Rinse BID  . Chlorhexidine Gluconate Cloth  6 each Topical Q0600  . diltiazem  180 mg Oral Daily  . gabapentin  300 mg Oral TID  . levalbuterol  2 puff Inhalation TID  . mouth rinse  15 mL Mouth Rinse q12n4p  . mirabegron ER  50 mg Oral Daily  . mometasone-formoterol  2 puff Inhalation BID  . pantoprazole  40 mg Oral Daily  . tamsulosin  0.4 mg Oral QPC supper     LOS: 3 days   Verneita Griffes, MD [facility] 10:11 AM   If 7PM-7AM, please contact night-coverage per Amion 04/22/2018, 10:11 AM

## 2018-04-22 NOTE — Progress Notes (Signed)
Spoke with Angie RN with IP about high vs low risk for COVID rule out, since patient no longer receiving nebulizer treatments or needing BiPap patient was acceptable to move to low risk rule out. [facility] agree with plan to move patient off airborne to a non airborne room and continue with low risk precautions for COVID rule out.

## 2018-04-23 ENCOUNTER — Inpatient Hospital Stay (HOSPITAL_COMMUNITY): Payer: Medicare Other

## 2018-04-23 DIAGNOSIS — L8992 Pressure ulcer of unspecified site, stage 2: Secondary | ICD-10-CM

## 2018-04-23 LAB — RENAL FUNCTION PANEL
Albumin: 2 g/dL — ABNORMAL LOW (ref 3.5–5.0)
Anion gap: 8 (ref 5–15)
BUN: 5 mg/dL — ABNORMAL LOW (ref 8–23)
CO2: 23 mmol/L (ref 22–32)
Calcium: 8.6 mg/dL — ABNORMAL LOW (ref 8.9–10.3)
Chloride: 108 mmol/L (ref 98–111)
Creatinine, Ser: 0.69 mg/dL (ref 0.61–1.24)
GFR calc Af Amer: >60 mL/min (ref >60)
GFR calc non Af Amer: >60 mL/min (ref >60)
Glucose, Bld: 72 mg/dL (ref 70–99)
POTASSIUM: 3.8 mmol/L (ref 3.5–5.1)
Phosphorus: 2 mg/dL — ABNORMAL LOW (ref 2.5–4.6)
Sodium: 139 mmol/L (ref 135–145)

## 2018-04-23 LAB — MAGNESIUM: Magnesium: 1.8 mg/dL (ref 1.7–2.4)

## 2018-04-23 LAB — NOVEL CORONAVIRUS, NAA (HOSP ORDER, SEND-OUT TO REF LAB; TAT 18-24 HRS): SARS-CoV-2, NAA: NOT DETECTED

## 2018-04-23 MED ORDER — SODIUM CHLORIDE 0.9 % IV SOLN
3.0000 g | Freq: Four times a day (QID) | INTRAVENOUS | Status: AC
  Administered 2018-04-23 – 2018-04-29 (×27): 3 g via INTRAVENOUS
  Filled 2018-04-23 (×29): qty 3

## 2018-04-23 MED ORDER — METOPROLOL TARTRATE 5 MG/5ML IV SOLN
5.0000 mg | Freq: Four times a day (QID) | INTRAVENOUS | Status: DC | PRN
  Administered 2018-04-25 – 2018-04-30 (×4): 5 mg via INTRAVENOUS
  Filled 2018-04-23 (×4): qty 5

## 2018-04-23 MED ORDER — POTASSIUM PHOSPHATES 15 MMOLE/5ML IV SOLN
20.0000 mmol | Freq: Once | INTRAVENOUS | Status: AC
  Administered 2018-04-23: 20 mmol via INTRAVENOUS
  Filled 2018-04-23: qty 6.67

## 2018-04-23 NOTE — Discharge Instructions (Signed)

## 2018-04-23 NOTE — Progress Notes (Signed)
1000 meds held as pt coughed up thick applesauce-looking sputum. Cough was very weak, and pt required much coaching to expectorate it. Fear that pt previously aspirated his applesauce. Will continue to closely monitor pt.

## 2018-04-23 NOTE — Progress Notes (Signed)
PT Cancellation Note  Patient Details Name: Kurt [name] MRN: [id number] DOB: [birth year]   Cancelled Treatment:    Reason Eval/Treat Not Completed: Patient not medically ready per MD hold while covid testing pending. Will cont to follow.   Reinaldo Berber, PT, DPT Acute Rehabilitation Services Pager: [phone] Office: [phone]     Reinaldo Berber 04/23/2018, 9:45 AM

## 2018-04-23 NOTE — Progress Notes (Signed)
SLP Cancellation Note  Patient Details Name: Kurt [name] MRN: [id number] DOB: [birth year]   Cancelled treatment:       Reason Eval/Treat Not Completed: Other (comment) Per health system leadership, therapy services are being held at this time until pt tests negative for Covid-19.   Discussed with RN: Pt with signs of dysphagia. Discussed with RN about POC for this patient, including meds by alternative means and maintaining NPO status. Will f/u as able.   Venita Sheffield Makynlie Rossini 04/23/2018, 12:11 PM  Pollyann Glen, M.A. [place] Acute Environmental education officer [phone] Office [phone]

## 2018-04-23 NOTE — Consult Note (Addendum)
[facility] Nurse wound consult note Reason for Consult: Consult requested for sacrum/buttocks.  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  Bedside nurses state patient has been combative and resistant to turning; so assessment was not performed at this time. Foam dressing is in place and pt is on a low airloss bed to reduce pressure.  No further topical treatment is indicated at this time.  DTPI are high risk to evolve into full thickness tissue loss.  Pressure Injury POA: Yes  [place] team will re-assess affected areas next week to determine if different topical treatment is needed. No family members present to discuss plan of care. Julien Girt MSN, RN, [place], [place], [place]

## 2018-04-23 NOTE — Progress Notes (Addendum)
PROGRESS NOTE    Kurt [name]  CWC:[id number]  DOB: 06/20/1940  DOA: 04/17/2018 PCP: [name], NP  Brief Narrative:  78 year old male with history of COPD, atrial fibrillation, tobacco use, BPH presented with complaints of cough, shortness of breath and fevers with T-max of 102F x2 days and was noted to be in A. fib with RVR.  Patient admitted for COPD exacerbation with pneumonia/hypoxic respiratory failure requiring BiPAP therapy.  He also has poor dentition and is on aspiration precautions/coverage.  His flu test is negative and CoVid19 testing has been sent with results pending.  Subjective:  Patient seen and examined with FULL PPE protection per guidelines.  He appears to be somnolent/disoriented with upper airway secretions.  Patient noted to be on lap restraint as well as mittens.  Saturating well on 6 L nasal cannula.  Objective: Vitals:   04/23/18 0600 04/23/18 0700 04/23/18 0800 04/23/18 0900  BP: 105/65 109/88 116/74 105/69  Pulse: 78 72 (!) 52 62  Resp: 19  (!) 25 (!) 33  Temp:      TempSrc:      SpO2: 91% 94% 100% 100%  Weight:      Height:        Intake/Output Summary (Last 24 hours) at 04/23/2018 0943 Last data filed at 04/23/2018 0900 Gross per 24 hour  Intake 2270.38 ml  Output 855 ml  Net 1415.38 ml   Filed Weights   04/21/18 0454 04/22/18 0343 04/23/18 0500  Weight: 61.3 kg 61.5 kg 63.6 kg    Physical Examination:  General exam: Appears somnolent and disoriented, easily arousable with verbal stimuli Respiratory system: Clear to auscultation at bases but upper airway secretions heard. Respiratory effort normal. Cardiovascular system: S1 & S2 heard, irregular, . No JVD, murmurs, rubs, gallops or clicks. No pedal edema. Gastrointestinal system: Abdomen is nondistended, soft and ?tender in epigastrium (grimaces). No organomegaly or masses felt. Normal bowel sounds heard. Central nervous system: Alert and oriented. No focal neurological deficits.  Extremities: Symmetric 5 x 5 power. Skin: stage 2 sacral decubitus Psychiatry: Judgement and insight appear normal. Mood & affect appropriate.     Data Reviewed: I have personally reviewed following labs and imaging studies  CBC: Recent Labs  Lab 05/01/2018 1932 04/26/2018 1949 04/20/18 0406 04/21/18 0230 04/22/18 0516  WBC 9.4  --  8.8 11.9* 7.8  NEUTROABS 8.1*  --  7.9*  --   --   HGB 13.1 12.9* 11.3* 10.3* 9.8*  HCT 39.7 38.0* 34.4* 31.4* 31.0*  MCV 96.8  --  97.2 96.6 97.2  PLT 420*  --  347 287 761   Basic Metabolic Panel: Recent Labs  Lab 04/18/2018 1932 04/27/2018 1949 04/20/18 0406 04/21/18 0230 04/22/18 0516 04/23/18 0258  NA 140 144 143 144 144 139  K 4.0 4.4 3.8 3.9 3.4* 3.8  CL 103  --  108 109 106 108  CO2 26  --  28 27 33* 23  GLUCOSE 121*  --  153* 130* 113* 72  BUN 20  --  19 17 8  5*  CREATININE 1.27*  --  0.96 0.78 0.73 0.69  CALCIUM 10.3  --  10.1 9.9 9.3 8.6*  MG  --   --   --  2.1  --  1.8  PHOS  --   --   --  2.9  --  2.0*   GFR: Estimated Creatinine Clearance: 67.4 mL/min (by C-G formula based on SCr of 0.69 mg/dL). Liver Function Tests: Recent Labs  Lab 04/06/2018 1932 04/20/18 0406 04/21/18 0230 04/23/18 0258  AST 21 21 25   --   ALT 14 14 18   --   ALKPHOS 52 47 40  --   BILITOT 1.5* 0.9 0.6  --   PROT 7.6 6.4* 5.8*  --   ALBUMIN 2.7* 2.2* 2.0* 2.0*   No results for input(s): LIPASE, AMYLASE in the last 168 hours. No results for input(s): AMMONIA in the last 168 hours. Coagulation Profile: No results for input(s): INR, PROTIME in the last 168 hours. Cardiac Enzymes: Recent Labs  Lab 04/06/2018 2250  TROPONINI <0.03   BNP (last 3 results) No results for input(s): PROBNP in the last 8760 hours. HbA1C: No results for input(s): HGBA1C in the last 72 hours. CBG: Recent Labs  Lab 04/20/18 0215  GLUCAP 149*   Lipid Profile: No results for input(s): CHOL, HDL, LDLCALC, TRIG, CHOLHDL, LDLDIRECT in the last 72 hours. Thyroid Function  Tests: No results for input(s): TSH, T4TOTAL, FREET4, T3FREE, THYROIDAB in the last 72 hours. Anemia Panel: Recent Labs    04/21/18 0230  VITAMINB12 674  FOLATE 9.5  FERRITIN 232  TIBC 165*  IRON 19*  RETICCTPCT 1.1   Sepsis Labs: Recent Labs  Lab 04/29/2018 2233 04/23/2018 2250 04/20/18 0650  PROCALCITON  --  0.82  --   LATICACIDVEN 3.7*  --  1.7    Recent Results (from the past 240 hour(s))  Blood culture (routine x 2)     Status: None (Preliminary result)   Collection Time: 04/23/2018  7:15 PM  Result Value Ref Range Status   Specimen Description BLOOD RIGHT ANTECUBITAL  Final   Special Requests   Final    BOTTLES DRAWN AEROBIC AND ANAEROBIC Blood Culture results may not be optimal due to an inadequate volume of blood received in culture bottles   Culture   Final    NO GROWTH 3 DAYS Performed at [facility], [place] [address], [place], [place] [address]    Report Status PENDING  Incomplete  Blood culture (routine x 2)     Status: None (Preliminary result)   Collection Time: 05/01/2018  7:35 PM  Result Value Ref Range Status   Specimen Description BLOOD BLOOD RIGHT FOREARM  Final   Special Requests   Final    BOTTLES DRAWN AEROBIC AND ANAEROBIC Blood Culture results may not be optimal due to an inadequate volume of blood received in culture bottles   Culture   Final    NO GROWTH 3 DAYS Performed at [facility], [place] [address], [place], [place] [address]    Report Status PENDING  Incomplete  Respiratory Panel by PCR     Status: None   Collection Time: 04/12/2018  7:58 PM  Result Value Ref Range Status   Adenovirus NOT DETECTED NOT DETECTED Final   Coronavirus 229E NOT DETECTED NOT DETECTED Final    Comment: (NOTE) The Coronavirus on the Respiratory Panel, DOES NOT test for the novel  Coronavirus (2019 nCoV)    Coronavirus HKU1 NOT DETECTED NOT DETECTED Final   Coronavirus NL63 NOT DETECTED NOT DETECTED Final   Coronavirus OC43 NOT DETECTED NOT DETECTED Final    Metapneumovirus NOT DETECTED NOT DETECTED Final   Rhinovirus / Enterovirus NOT DETECTED NOT DETECTED Final   Influenza A NOT DETECTED NOT DETECTED Final   Influenza B NOT DETECTED NOT DETECTED Final   Parainfluenza Virus 1 NOT DETECTED NOT DETECTED Final   Parainfluenza Virus 2 NOT DETECTED NOT DETECTED Final  Parainfluenza Virus 3 NOT DETECTED NOT DETECTED Final   Parainfluenza Virus 4 NOT DETECTED NOT DETECTED Final   Respiratory Syncytial Virus NOT DETECTED NOT DETECTED Final   Bordetella pertussis NOT DETECTED NOT DETECTED Final   Chlamydophila pneumoniae NOT DETECTED NOT DETECTED Final   Mycoplasma pneumoniae NOT DETECTED NOT DETECTED Final    Comment: Performed at [facility], [place] [address], [place], [place] [address]  Urine culture     Status: None   Collection Time: 04/20/18  3:55 AM  Result Value Ref Range Status   Specimen Description URINE, RANDOM  Final   Special Requests NONE  Final   Culture   Final    NO GROWTH Performed at [facility], [place] [address], [place], [place] [address]    Report Status 04/21/2018 FINAL  Final  Expectorated sputum assessment w rflx to resp cult     Status: None   Collection Time: 04/20/18  4:00 AM  Result Value Ref Range Status   Specimen Description SPUTUM  Final   Special Requests NONE  Final   Sputum evaluation   Final    Sputum specimen not acceptable for testing.  Please recollect.   Gram Stain Report Called to,Read Back By and Verified With: RN K WHITE @ [phone] 04/20/18 BY Tobey Bride Performed at [facility], [place] [address], [place], [place] [address]    Report Status 04/20/2018 FINAL  Final  MRSA PCR Screening     Status: None   Collection Time: 04/20/18  4:05 AM  Result Value Ref Range Status   MRSA by PCR NEGATIVE NEGATIVE Final    Comment:        The GeneXpert MRSA Assay (FDA approved for NASAL specimens only), is one component of a comprehensive MRSA colonization surveillance program. It is not  intended to diagnose MRSA infection nor to guide or monitor treatment for MRSA infections. Performed at [facility], [place] [address], [place], [place] [address]   Expectorated sputum assessment w rflx to resp cult     Status: None   Collection Time: 04/20/18 11:04 AM  Result Value Ref Range Status   Specimen Description SPUTUM  Final   Special Requests NONE  Final   Sputum evaluation   Final    THIS SPECIMEN IS ACCEPTABLE FOR SPUTUM CULTURE Performed at [facility], [place] [address], [place], [place] [address]    Report Status 04/20/2018 FINAL  Final  Culture, respiratory     Status: None   Collection Time: 04/20/18 11:04 AM  Result Value Ref Range Status   Specimen Description SPUTUM  Final   Special Requests NONE Reflexed from [id number]  Final   Gram Stain   Final    FEW WBC PRESENT, PREDOMINANTLY PMN FEW GRAM POSITIVE COCCI RARE YEAST    Culture   Final    FEW Consistent with normal respiratory flora. Performed at [facility], [place] [address], [place],  [address]    Report Status 04/22/2018 FINAL  Final      Radiology Studies: No results found.      Scheduled Meds: . apixaban  5 mg Oral BID  . chlorhexidine  15 mL Mouth Rinse BID  . Chlorhexidine Gluconate Cloth  6 each Topical Q0600  . diltiazem  120 mg Oral Daily  . gabapentin  300 mg Oral TID  . levalbuterol  2 puff Inhalation TID  . mouth rinse  15 mL Mouth Rinse q12n4p  .  mirabegron ER  50 mg Oral Daily  . mometasone-formoterol  2 puff Inhalation BID  . pantoprazole  40 mg Oral Daily  . tamsulosin  0.4 mg Oral QPC supper   Continuous Infusions: . sodium chloride Stopped (04/20/18 1939)  . azithromycin Stopped (04/22/18 1858)  . cefTRIAXone (ROCEPHIN)  IV Stopped (04/22/18 1629)  . dextrose 5 % and 0.45% NaCl Stopped (04/23/18 0825)  . metronidazole Stopped (04/23/18 4193)    Assessment & Plan:    1.  Acute hypoxic respiratory failure: Secondary to bacterial pneumonia  (community-acquired/aspiration) versus viral.  To simplify antibiotic regimen will transition to Unasyn plus azithromycin (day 4, start date March 16).  Will request periodic deep oral suctioning given upper airway secretions.  Speech therapy to follow-up regarding safe swallowing.  Continue MDI (if tolerates) as we await CoVid 19 test results.  He is on fluids, keep NPO. Taper 02 as tolerated  2.  Altered mental status/delirium: Likely secondary to problem #1.  Patient noted to be delirious last night and thought he was at home.  He is on maintenance and lap restraint right now.  Did not receive any sedatives overnight other than scheduled gabapentin. NPO for now.  Holding Neurontin for now  3.  COPD: Can consider transition MDI to nebs once CoVid PCR available.  Otherwise management as described in problem #1  4.  Atrial fibrillation with RVR: On oral Cardizem, will have IV metoprolol PRN available.  On apixaban  5.  BPH: Tamsulosin  6.  Tobacco use: Will place on nicotine patch in case of withdrawal leading to problem #2  7. Stage 2 decubitus: Local wound care  DVT prophylaxis: On apixaban Code Status: DNR per record Family / Patient Communication: No family present bedside Disposition Plan: To be decided.     LOS: 4 days    Time spent: 35 minutes    Guilford Shi, MD [facility] Pager [phone]  If 7PM-7AM, please contact night-coverage [email or web address] Password [facility] 04/23/2018, 9:43 AM

## 2018-04-24 ENCOUNTER — Inpatient Hospital Stay (HOSPITAL_COMMUNITY): Payer: Medicare Other

## 2018-04-24 DIAGNOSIS — E43 Unspecified severe protein-calorie malnutrition: Secondary | ICD-10-CM

## 2018-04-24 LAB — CBC
HCT: 35.6 % — ABNORMAL LOW (ref 39.0–52.0)
HEMOGLOBIN: 12 g/dL — AB (ref 13.0–17.0)
MCH: 31.7 pg (ref 26.0–34.0)
MCHC: 33.7 g/dL (ref 30.0–36.0)
MCV: 94.2 fL (ref 80.0–100.0)
Platelets: 337 10*3/uL (ref 150–400)
RBC: 3.78 MIL/uL — ABNORMAL LOW (ref 4.22–5.81)
RDW: 13.4 % (ref 11.5–15.5)
WBC: 6.2 10*3/uL (ref 4.0–10.5)
nRBC: 0 % (ref 0.0–0.2)

## 2018-04-24 LAB — GLUCOSE, CAPILLARY
GLUCOSE-CAPILLARY: 74 mg/dL (ref 70–99)
Glucose-Capillary: 105 mg/dL — ABNORMAL HIGH (ref 70–99)
Glucose-Capillary: 91 mg/dL (ref 70–99)

## 2018-04-24 LAB — CULTURE, BLOOD (ROUTINE X 2)
Culture: NO GROWTH
Culture: NO GROWTH

## 2018-04-24 LAB — BASIC METABOLIC PANEL
Anion gap: 10 (ref 5–15)
BUN: <5 mg/dL — ABNORMAL LOW (ref 8–23)
CO2: 26 mmol/L (ref 22–32)
Calcium: 8.8 mg/dL — ABNORMAL LOW (ref 8.9–10.3)
Chloride: 104 mmol/L (ref 98–111)
Creatinine, Ser: 0.68 mg/dL (ref 0.61–1.24)
GFR calc Af Amer: >60 mL/min (ref >60)
GFR calc non Af Amer: >60 mL/min (ref >60)
Glucose, Bld: 102 mg/dL — ABNORMAL HIGH (ref 70–99)
Potassium: 3.4 mmol/L — ABNORMAL LOW (ref 3.5–5.1)
Sodium: 140 mmol/L (ref 135–145)

## 2018-04-24 LAB — PHOSPHORUS
Phosphorus: 2.5 mg/dL (ref 2.5–4.6)
Phosphorus: 2.2 mg/dL — ABNORMAL LOW (ref 2.5–4.6)

## 2018-04-24 LAB — MAGNESIUM
MAGNESIUM: 1.8 mg/dL (ref 1.7–2.4)
MAGNESIUM: 1.9 mg/dL (ref 1.7–2.4)

## 2018-04-24 LAB — BRAIN NATRIURETIC PEPTIDE: B Natriuretic Peptide: 315.8 pg/mL — ABNORMAL HIGH (ref 0.0–100.0)

## 2018-04-24 MED ORDER — DILTIAZEM 12 MG/ML ORAL SUSPENSION
40.0000 mg | Freq: Three times a day (TID) | ORAL | Status: DC
  Administered 2018-04-24 – 2018-04-27 (×8): 40 mg
  Filled 2018-04-24 (×11): qty 6

## 2018-04-24 MED ORDER — APIXABAN 5 MG PO TABS
5.0000 mg | ORAL_TABLET | Freq: Two times a day (BID) | ORAL | Status: DC
  Administered 2018-04-24 – 2018-05-01 (×15): 5 mg
  Filled 2018-04-24 (×16): qty 1

## 2018-04-24 MED ORDER — NICOTINE 14 MG/24HR TD PT24
14.0000 mg | MEDICATED_PATCH | Freq: Every day | TRANSDERMAL | Status: DC
  Administered 2018-04-24 – 2018-05-01 (×8): 14 mg via TRANSDERMAL
  Filled 2018-04-24 (×8): qty 1

## 2018-04-24 MED ORDER — POTASSIUM CHLORIDE 10 MEQ/100ML IV SOLN
10.0000 meq | INTRAVENOUS | Status: AC
  Filled 2018-04-24: qty 100

## 2018-04-24 MED ORDER — ACETAMINOPHEN 325 MG PO TABS: 650.0000 mg | ORAL_TABLET | Freq: Four times a day (QID) | ORAL | Status: DC | PRN

## 2018-04-24 MED ORDER — POTASSIUM CHLORIDE 20 MEQ/15ML (10%) PO SOLN
20.0000 meq | Freq: Once | ORAL | Status: AC
  Administered 2018-04-24: 20 meq
  Filled 2018-04-24: qty 15

## 2018-04-24 MED ORDER — IOHEXOL 300 MG/ML  SOLN
10.0000 mL | Freq: Once | INTRAMUSCULAR | Status: AC | PRN
  Administered 2018-04-24: 10 mL via ORAL

## 2018-04-24 MED ORDER — JEVITY 1.2 CAL PO LIQD
1000.0000 mL | ORAL | Status: DC
  Administered 2018-04-24 – 2018-04-26 (×4): 1000 mL
  Filled 2018-04-24 (×8): qty 1000

## 2018-04-24 MED ORDER — IPRATROPIUM-ALBUTEROL 0.5-2.5 (3) MG/3ML IN SOLN
3.0000 mL | Freq: Four times a day (QID) | RESPIRATORY_TRACT | Status: DC
  Administered 2018-04-24: 3 mL via RESPIRATORY_TRACT
  Filled 2018-04-24 (×2): qty 3

## 2018-04-24 MED ORDER — LIDOCAINE VISCOUS HCL 2 % MT SOLN
OROMUCOSAL | Status: AC
  Filled 2018-04-24: qty 15

## 2018-04-24 MED ORDER — LIDOCAINE VISCOUS HCL 2 % MT SOLN
15.0000 mL | Freq: Once | OROMUCOSAL | Status: AC
  Administered 2018-04-24: 6 mL via OROMUCOSAL
  Filled 2018-04-24: qty 15

## 2018-04-24 MED ORDER — PANTOPRAZOLE SODIUM 40 MG PO PACK
40.0000 mg | PACK | Freq: Every day | ORAL | Status: DC
  Administered 2018-04-24 – 2018-05-01 (×8): 40 mg
  Filled 2018-04-24 (×8): qty 20

## 2018-04-24 MED ORDER — FUROSEMIDE 10 MG/ML IJ SOLN
20.0000 mg | Freq: Once | INTRAMUSCULAR | Status: AC
  Administered 2018-04-24: 20 mg via INTRAVENOUS
  Filled 2018-04-24: qty 2

## 2018-04-24 MED ORDER — IPRATROPIUM-ALBUTEROL 0.5-2.5 (3) MG/3ML IN SOLN
3.0000 mL | RESPIRATORY_TRACT | Status: DC | PRN
  Administered 2018-04-28: 3 mL via RESPIRATORY_TRACT
  Filled 2018-04-24 (×2): qty 3

## 2018-04-24 NOTE — Progress Notes (Addendum)
Initial Nutrition Assessment  DOCUMENTATION CODES:   Severe malnutrition in context of chronic illness  INTERVENTION:   When feeding tube placed, begin:  Jevity 1.2 at 20 ml/h, increase 10 ml every 4 hours to goal rate of 70 ml/h (1680 ml per day)  Provides 2016 kcal, 93 gm protein, 1378 ml free water daily  Monitor magnesium, potassium, and phosphorus daily for at least 3 days, MD to replete as needed, as pt is at risk for refeeding syndrome given severe PCM.  NUTRITION DIAGNOSIS:   Severe Malnutrition related to chronic illness(COPD) as evidenced by severe muscle depletion, severe fat depletion, percent weight loss(14% weight loss within 6 months).  GOAL:   Patient will meet greater than or equal to 90% of their needs  MONITOR:   TF tolerance, Labs, Diet advancement, I & O's, Skin  REASON FOR ASSESSMENT:   Consult Enteral/tube feeding initiation and management  ASSESSMENT:   78 yo male with PMH of COPD, cigarette smoker, A fib, HLD, diverticulosis, and alcohol abuse who was admitted with COPD exacerbation, PNA requiring BiPAP. Flu & Covid-19 resulted negative.   Patient unable to provide any nutrition history.  S/P SLP evaluation this morning. Remains NPO due to significant oropharyngeal dysphagia. Suspect patient had difficulty swallowing PTA.   Received MD Consult for TF initiation and management. Cortrak was attempted, but unable to pass into stomach. Hopefully can send to IR/Fluoroscopy for feeding tube placement today.  Labs reviewed. BUN 5 (L), phosphorus 2 (L) Medications reviewed and include Flomax and KCl.   Per review of usual weights, patient has lost 14% of usual weight within the past 6 months. He weighed 78.9 kg 11/04/17, down to 63.2 kg today. Suspect intake PTA was very poor, given severe muscle and fat depletions and weight loss.  NUTRITION - FOCUSED PHYSICAL EXAM:    Most Recent Value  Orbital Region  Severe depletion  Upper Arm Region  Moderate  depletion  Thoracic and Lumbar Region  Moderate depletion  Buccal Region  Severe depletion  Temple Region  Severe depletion  Clavicle Bone Region  No depletion  Clavicle and Acromion Bone Region  Severe depletion  Scapular Bone Region  Unable to assess  Dorsal Hand  Mild depletion  Patellar Region  Severe depletion  Anterior Thigh Region  Severe depletion  Posterior Calf Region  Severe depletion  Edema (RD Assessment)  None  Hair  Reviewed  Eyes  Reviewed  Mouth  Reviewed  Skin  Reviewed  Nails  Reviewed       Diet Order:   Diet Order            Diet NPO time specified  Diet effective now              EDUCATION NEEDS:   No education needs have been identified at this time  Skin:  Skin Assessment: Skin Integrity Issues: Skin Integrity Issues:: DTI DTI: sacrum  Last BM:  3/18  Height:   Ht Readings from Last 1 Encounters:  04/06/2018 6' (1.829 m)    Weight:   Wt Readings from Last 1 Encounters:  04/24/18 63.2 kg    Ideal Body Weight:  80.9 kg  BMI:  Body mass index is 18.9 kg/m.  Estimated Nutritional Needs:   Kcal:  1900-2100  Protein:  90-110 gm  Fluid:  1.9 L    Molli Barrows, RD, LDN, [place] Pager [phone] After Hours Pager [phone]

## 2018-04-24 NOTE — Progress Notes (Addendum)
Cortrak Tube Team Note:  Consult received to place a Cortrak feeding tube.   RD attempted multiple Cortrak placements in both nares without success. Tube was unable to advance past 45 cm marking. Contacted the Attending. Plan for IR placement. RD to bridle once tube is placed.   Addendum: Cortrak secured with bridle in the left nare at 86 cm marking.   Mariana Single RD, LDN Clinical Nutrition Pager # [phone]

## 2018-04-24 NOTE — Progress Notes (Signed)
  Speech Language Pathology Treatment: Dysphagia  Patient Details Name: Kurt [name] MRN: [id number] DOB: 03/04/1940 Today's Date: 04/24/2018 Time: 3570-1779 SLP Time Calculation (min) (ACUTE ONLY): 24 min  Assessment / Plan / Recommendation Clinical Impression  Pt reassessed for PO readiness given now that COVID-19 testing has returned  Negative. Pt continues to exhibit signs of a significant oropharyngeal dysphagia, with session primarily focused on secretion management. Oral care was completed with white coating noted throughout his mouth. Dental hygiene is poor. His voice is still almost aphonic but with audible wetness concerning for pooling secretions. Any time that I put the yankauer in his mouth I can remove a moderate amount of frothy secretions, but he has difficulty coughing to clear what sounds like it is lower in his pharynx/larynx. Minimal amounts of ice chips and water were administered to try to facilitate clearance of secretions, with immediate coughing observed but still not clearing audible wetness. Pt was left in an upright position and RN notified. In addition to the above, pt's RR hovers around 30 but will increase intermittently throughout session. Although I believe pt needs an instrumental study, I still do not think that he is ready at this time. Now that he has been taken off of precautions for possible COVID19, recommend considering temporary alternative means of nutrition. Pt can now also try to mobilize more. Will given him a little time to see if he has further improvement from a respiratory standpoint (and overall endurance) before proceeding with testing. Voiced my concerns again to MD about pt's descriptions PTA, which could be concerning for underlying disease process. May wish to consider FEES when ready in order to better visualize the state of his larynx/pharynx.   HPI HPI: 78yo w/ a hx of GERD, COPD, PNA, atrial fibrillation, tobacco abuse, and BPH who presented  w/ 2 days of shortness of breath.  CXR concerning for PNA in the bilateral bases. COVID-19 negative. Pt had a prior MBS in 2011 but results are not available. Pt describes several months of trouble swallowing, dysphonia, throat pain, and ear pain PTA.      SLP Plan  Continue with current plan of care       Recommendations  Diet recommendations: NPO Medication Administration: Via alternative means                Oral Care Recommendations: Oral care QID Follow up Recommendations: (tba) SLP Visit Diagnosis: Dysphagia, unspecified (R13.10) Plan: Continue with current plan of care       GO                Venita Sheffield [facility] 04/24/2018, 9:47 AM  Pollyann Glen, M.A. [place] Acute Environmental education officer [phone] Office [phone]

## 2018-04-24 NOTE — Progress Notes (Addendum)
PROGRESS NOTE    CHAWN SPRAGGINS  GBT:[id number]  DOB: 12-04-1940  DOA: 05/02/2018 PCP: [name], NP  Brief Narrative:  78 year old male with history of COPD, atrial fibrillation, tobacco use, BPH presented with complaints of cough, shortness of breath and fevers with T-max of 102F x2 days and was noted to be in A. fib with RVR.  Patient admitted for COPD exacerbation with pneumonia/hypoxic respiratory failure requiring BiPAP therapy.  He also has poor dentition and is on aspiration precautions/coverage.  His flu and CoVid19 testing resulted negative.   Subjective:  Patient seen and examined.  He appears to be more awake, alert oriented with improved  upper airway secretions. Sitting up in bed. Still requiring lap restraints at times.   Saturating well on 5 L nasal cannula.  Objective: Vitals:   04/24/18 0800 04/24/18 0900 04/24/18 1000 04/24/18 1020  BP: 95/64 106/69  113/83  Pulse: 77 84 98 91  Resp: (!) 37 (!) 36 20 (!) 25  Temp:      TempSrc:      SpO2: 97% 99% 93% 99%  Weight:      Height:        Intake/Output Summary (Last 24 hours) at 04/24/2018 1048 Last data filed at 04/24/2018 0800 Gross per 24 hour  Intake 2381.13 ml  Output 2300 ml  Net 81.13 ml   Filed Weights   04/22/18 0343 04/23/18 0500 04/24/18 0436  Weight: 61.5 kg 63.6 kg 63.2 kg    Physical Examination:  General exam: Appears more awake, alert Respiratory system: Clear to auscultation at bases but upper airway secretions heard. Respiratory effort normal. Cardiovascular system: S1 & S2 heard, irregular, . No JVD, murmurs, rubs, gallops or clicks. No pedal edema. Gastrointestinal system: Abdomen is nondistended, soft and ?tender in epigastrium (grimaces). No organomegaly or masses felt. Normal bowel sounds heard. Central nervous system: Alert and oriented x3. No focal neurological deficits. Extremities: Symmetric 5 x 5 power. Skin: stage 2 sacral decubitus Psychiatry: . Mood & affect  Impulsive     Data Reviewed: I have personally reviewed following labs and imaging studies  CBC: Recent Labs  Lab 05/02/2018 1932 04/28/2018 1949 04/20/18 0406 04/21/18 0230 04/22/18 0516  WBC 9.4  --  8.8 11.9* 7.8  NEUTROABS 8.1*  --  7.9*  --   --   HGB 13.1 12.9* 11.3* 10.3* 9.8*  HCT 39.7 38.0* 34.4* 31.4* 31.0*  MCV 96.8  --  97.2 96.6 97.2  PLT 420*  --  347 287 710   Basic Metabolic Panel: Recent Labs  Lab 04/07/2018 1932 04/23/2018 1949 04/20/18 0406 04/21/18 0230 04/22/18 0516 04/23/18 0258  NA 140 144 143 144 144 139  K 4.0 4.4 3.8 3.9 3.4* 3.8  CL 103  --  108 109 106 108  CO2 26  --  28 27 33* 23  GLUCOSE 121*  --  153* 130* 113* 72  BUN 20  --  19 17 8  5*  CREATININE 1.27*  --  0.96 0.78 0.73 0.69  CALCIUM 10.3  --  10.1 9.9 9.3 8.6*  MG  --   --   --  2.1  --  1.8  PHOS  --   --   --  2.9  --  2.0*   GFR: Estimated Creatinine Clearance: 66.9 mL/min (by C-G formula based on SCr of 0.69 mg/dL). Liver Function Tests: Recent Labs  Lab 04/23/2018 1932 04/20/18 0406 04/21/18 0230 04/23/18 0258  AST 21 21 25   --  ALT 14 14 18   --   ALKPHOS 52 47 40  --   BILITOT 1.5* 0.9 0.6  --   PROT 7.6 6.4* 5.8*  --   ALBUMIN 2.7* 2.2* 2.0* 2.0*   No results for input(s): LIPASE, AMYLASE in the last 168 hours. No results for input(s): AMMONIA in the last 168 hours. Coagulation Profile: No results for input(s): INR, PROTIME in the last 168 hours. Cardiac Enzymes: Recent Labs  Lab 05/02/2018 2250  TROPONINI <0.03   BNP (last 3 results) No results for input(s): PROBNP in the last 8760 hours. HbA1C: No results for input(s): HGBA1C in the last 72 hours. CBG: Recent Labs  Lab 04/20/18 0215  GLUCAP 149*   Lipid Profile: No results for input(s): CHOL, HDL, LDLCALC, TRIG, CHOLHDL, LDLDIRECT in the last 72 hours. Thyroid Function Tests: No results for input(s): TSH, T4TOTAL, FREET4, T3FREE, THYROIDAB in the last 72 hours. Anemia Panel: No results for input(s):  VITAMINB12, FOLATE, FERRITIN, TIBC, IRON, RETICCTPCT in the last 72 hours. Sepsis Labs: Recent Labs  Lab 04/23/2018 2233 04/16/2018 2250 04/20/18 0650  PROCALCITON  --  0.82  --   LATICACIDVEN 3.7*  --  1.7    Recent Results (from the past 240 hour(s))  Blood culture (routine x 2)     Status: None (Preliminary result)   Collection Time: 04/12/2018  7:15 PM  Result Value Ref Range Status   Specimen Description BLOOD RIGHT ANTECUBITAL  Final   Special Requests   Final    BOTTLES DRAWN AEROBIC AND ANAEROBIC Blood Culture results may not be optimal due to an inadequate volume of blood received in culture bottles   Culture   Final    NO GROWTH 4 DAYS Performed at [facility] [address] [place], [place] [address]    Report Status PENDING  Incomplete  Blood culture (routine x 2)     Status: None (Preliminary result)   Collection Time: 04/08/2018  7:35 PM  Result Value Ref Range Status   Specimen Description BLOOD BLOOD RIGHT FOREARM  Final   Special Requests   Final    BOTTLES DRAWN AEROBIC AND ANAEROBIC Blood Culture results may not be optimal due to an inadequate volume of blood received in culture bottles   Culture   Final    NO GROWTH 4 DAYS Performed at [facility], [place] [address] [place], [place] [address]    Report Status PENDING  Incomplete  Respiratory Panel by PCR     Status: None   Collection Time: 04/18/2018  7:58 PM  Result Value Ref Range Status   Adenovirus NOT DETECTED NOT DETECTED Final   Coronavirus 229E NOT DETECTED NOT DETECTED Final    Comment: (NOTE) The Coronavirus on the Respiratory Panel, DOES NOT test for the novel  Coronavirus (2019 nCoV)    Coronavirus HKU1 NOT DETECTED NOT DETECTED Final   Coronavirus NL63 NOT DETECTED NOT DETECTED Final   Coronavirus OC43 NOT DETECTED NOT DETECTED Final   Metapneumovirus NOT DETECTED NOT DETECTED Final   Rhinovirus / Enterovirus NOT DETECTED NOT DETECTED Final   Influenza A NOT DETECTED NOT DETECTED  Final   Influenza B NOT DETECTED NOT DETECTED Final   Parainfluenza Virus 1 NOT DETECTED NOT DETECTED Final   Parainfluenza Virus 2 NOT DETECTED NOT DETECTED Final   Parainfluenza Virus 3 NOT DETECTED NOT DETECTED Final   Parainfluenza Virus 4 NOT DETECTED NOT DETECTED Final   Respiratory Syncytial Virus NOT DETECTED NOT DETECTED Final  Bordetella pertussis NOT DETECTED NOT DETECTED Final   Chlamydophila pneumoniae NOT DETECTED NOT DETECTED Final   Mycoplasma pneumoniae NOT DETECTED NOT DETECTED Final    Comment: Performed at [facility], [place] [address] [place], [place] [address]  Novel Coronavirus, NAA (hospital order; send-out to ref lab)     Status: None   Collection Time: 04/28/2018  7:58 PM  Result Value Ref Range Status   SARS-CoV-2, NAA Not Detected Not Detected Final    Comment: (NOTE) Testing was performed using the cobas(R) SARS-CoV-2 test. This test was developed and its performance characteristics determined by [employer]. This test has not been FDA cleared or approved. This test has been authorized by FDA under an Emergency Use Authorization (EUA). This test is only authorized for the duration of time the declaration that circumstances exist justifying the authorization of the emergency use of in vitro diagnostic tests for detection of SARS-CoV-2 virus and/or diagnosis of COVID-19 infection under section 564(b)(1) of the Act, 21 U.S.C. 403KVQ-2(V)(9), unless the authorization is terminated or revoked sooner. Performed At: [facility] [address] [place], [place] [id number] Rush Farmer MD PI:[id number]    Coronavirus Source NASAL SWAB  Final    Comment: Performed at [facility], [place] [address] [place], [place] [address]  Urine culture     Status: None   Collection Time: 04/20/18  3:55 AM  Result Value Ref Range Status   Specimen Description URINE, RANDOM  Final   Special Requests NONE  Final   Culture   Final    NO GROWTH  Performed at [facility], [place] [address] [place], [place] [address]    Report Status 04/21/2018 FINAL  Final  Expectorated sputum assessment w rflx to resp cult     Status: None   Collection Time: 04/20/18  4:00 AM  Result Value Ref Range Status   Specimen Description SPUTUM  Final   Special Requests NONE  Final   Sputum evaluation   Final    Sputum specimen not acceptable for testing.  Please recollect.   Gram Stain Report Called to,Read Back By and Verified With: RN K WHITE @ [phone] 04/20/18 BY Tobey Bride Performed at [facility], [place] [address] [place], [place] [address]    Report Status 04/20/2018 FINAL  Final  MRSA PCR Screening     Status: None   Collection Time: 04/20/18  4:05 AM  Result Value Ref Range Status   MRSA by PCR NEGATIVE NEGATIVE Final    Comment:        The GeneXpert MRSA Assay (FDA approved for NASAL specimens only), is one component of a comprehensive MRSA colonization surveillance program. It is not intended to diagnose MRSA infection nor to guide or monitor treatment for MRSA infections. Performed at [facility], [place] [address], [place], [place] [address]   Expectorated sputum assessment w rflx to resp cult     Status: None   Collection Time: 04/20/18 11:04 AM  Result Value Ref Range Status   Specimen Description SPUTUM  Final   Special Requests NONE  Final   Sputum evaluation   Final    THIS SPECIMEN IS ACCEPTABLE FOR SPUTUM CULTURE Performed at [facility], [place] [address], [place],  [address]    Report Status 04/20/2018 FINAL  Final  Culture, respiratory     Status: None   Collection Time: 04/20/18 11:04 AM  Result Value Ref Range Status   Specimen  Description SPUTUM  Final   Special Requests NONE Reflexed from [id number]  Final   Gram Stain   Final    FEW WBC PRESENT, PREDOMINANTLY PMN FEW GRAM POSITIVE COCCI RARE YEAST    Culture   Final    FEW Consistent with normal respiratory flora. Performed at [facility], [place] [address], [place], [place] [address]    Report Status 04/22/2018 FINAL  Final      Radiology Studies: Dg Chest Port 1 View  Result Date: 04/23/2018 CLINICAL DATA:  History of COPD. Patient admitted 04/20/2018 after cough, fever and shortness of breath for 2 days. EXAM: PORTABLE CHEST 1 VIEW COMPARISON:  Single-view of the chest 04/16/2018. PA and lateral chest 03/03/2018. FINDINGS: The lungs are emphysematous. Bilateral mid and lower lung zone airspace disease has worsened since the most recent examination. No pneumothorax. There are likely small bilateral pleural effusions. Heart size is enlarged. Aortic atherosclerosis noted. No acute or focal bony abnormality. IMPRESSION: Worsened bilateral airspace disease and small bilateral pleural effusions could be due to increased pulmonary edema and/or pneumonia. Emphysema. Electronically Signed   By: Inge Rise M.D.   On: 04/23/2018 11:53        Scheduled Meds: . apixaban  5 mg Per Tube BID  . chlorhexidine  15 mL Mouth Rinse BID  . Chlorhexidine Gluconate Cloth  6 each Topical Q0600  . diltiazem  120 mg Oral Daily  . levalbuterol  2 puff Inhalation TID  . mouth rinse  15 mL Mouth Rinse q12n4p  . mirabegron ER  50 mg Oral Daily  . mometasone-formoterol  2 puff Inhalation BID  . pantoprazole sodium  40 mg Per Tube Daily  . tamsulosin  0.4 mg Oral QPC supper   Continuous Infusions: . sodium chloride 10 mL/hr at 04/24/18 0800  . ampicillin-sulbactam (UNASYN) IV 3 g (04/24/18 1014)  . azithromycin Stopped (04/23/18 1908)    Assessment & Plan:    1.  Acute hypoxic respiratory failure/sepsis: POA. Secondary to bacterial pneumonia vs aspiration. Viral etiology ruled out.  Continue Unasyn, to finish azithromycin (day 5).  Continue periodic deep oral suctioning for secretions which seems to be helping much.  D/W Speech therapy who advised of persistent aspiration risk and recommended NPO. Will try Cortrak and initiate  tube feeds and ST to continue following. Change MDI to nebs as -ve CoVid 19 test results. Repeat CXR suggestive of worsening infiltrates vs pulmonary edema. D/C fluids, keep NPO.Free water via NG if possible. Taper 02 as tolerated  2.  Altered mental status/delirium: Likely metabolic encephalopathy secondary to problem #1. He is requiring mittens and lap restraints PRN but much more awake.alert today.Holding Neurontin for now in concern for sedation.  Hopefully can come off restraints in next 24 hours.   3.  COPD: Can transition MDI to nebs. Otherwise management as described in problem #1  4.  Atrial fibrillation with RVR: On oral Cardizem, will have IV metoprolol PRN available.  On apixaban to be given via NG tube  5.  BPH: Tamsulosin  6.  Tobacco use: Will place on nicotine patch in case of withdrawal leading to problem #2  7. Stage 2 decubitus: Foam dressing is in place and pt is on a low air-loss bed to reduce pressure. Wound care following  8. Mild hypokalemia; replace  DVT prophylaxis: On apixaban Code Status: DNR per record Family / Patient Communication: No family present bedside Disposition Plan: To be decided.     LOS: 5 days  Time spent: 35 minutes    Guilford Shi, MD [facility] Pager [phone]  If 7PM-7AM, please contact night-coverage [email or web address] Password [facility] 04/24/2018, 10:48 AM

## 2018-04-24 NOTE — Progress Notes (Signed)
Rehab Admissions Coordinator Note:  Patient was screened by Cleatrice Burke for appropriateness for an Inpatient Acute Rehab Consult per PT recommendations.  At this time, we are recommending Inpatient Rehab consult. Please place order for consult.  Cleatrice Burke RN MSN 04/24/2018, 8:26 PM  I can be reached at [phone].

## 2018-04-24 NOTE — Evaluation (Signed)
Physical Therapy Evaluation Patient Details Name: Kurt [name] MRN: [id number] DOB: [birth year] Today's Date: 04/24/2018   History of Present Illness  78 year old male with history of COPD, atrial fibrillation, tobacco use, BPH presented with complaints of cough, shortness of breath and fevers with T-max of 102F x2 days and was noted to be in A. fib with RVR.  Patient admitted for COPD exacerbation with pneumonia/hypoxic respiratory failure requiring BiPAP therapy.  He also has poor dentition and is on aspiration precautions/coverage.  His flu and CoVid19 testing resulted negative.     Clinical Impression  Pt admitted with above diagnosis. Pt currently with functional limitations due to the deficits listed below (see PT Problem List). PTA pt reports independence living with daughter. Today weak and standing EOB x5, mod A, side stepping.  Pt will benefit from skilled PT to increase their independence and safety with mobility to allow discharge to the venue listed below.    HR 100-170 SpO2 WNL on 5L      Follow Up Recommendations CIR    Equipment Recommendations  (TBD)    Recommendations for Other Services OT consult     Precautions / Restrictions Precautions Precautions: Fall Restrictions Weight Bearing Restrictions: No      Mobility  Bed Mobility Overal bed mobility: Needs Assistance Bed Mobility: Supine to Sit     Supine to sit: Min guard        Transfers Overall transfer level: Needs assistance Equipment used: 1 person hand held assist;2 person hand held assist Transfers: Sit to/from Stand Sit to Stand: Mod assist         General transfer comment: mod A to stand and provide stabilty x5, side stepping. HR 110-170  Ambulation/Gait             General Gait Details: deferred due to high HR  Stairs            Wheelchair Mobility    Modified Rankin (Stroke Patients Only)       Balance Overall balance assessment: Needs assistance   Sitting  balance-Leahy Scale: Poor       Standing balance-Leahy Scale: Poor                               Pertinent Vitals/Pain Pain Assessment: Faces Pain Score: 5  Faces Pain Scale: No hurt    Home Living Family/patient expects to be discharged to:: Private residence Living Arrangements: Children Available Help at Discharge: Family Type of Home: House Home Access: Stairs to enter              Prior Function Level of Independence: Independent         Comments: daughter lives with him.      Hand Dominance        Extremity/Trunk Assessment   Upper Extremity Assessment Upper Extremity Assessment: Generalized weakness    Lower Extremity Assessment Lower Extremity Assessment: Generalized weakness       Communication   Communication: No difficulties  Cognition Arousal/Alertness: Awake/alert Behavior During Therapy: WFL for tasks assessed/performed Overall Cognitive Status: Within Functional Limits for tasks assessed                                        General Comments      Exercises     Assessment/Plan    PT Assessment Patient needs  continued PT services  PT Problem List Decreased strength       PT Treatment Interventions DME instruction;Gait training;Stair training;Functional mobility training;Therapeutic activities;Therapeutic exercise;Balance training    PT Goals (Current goals can be found in the Care Plan section)  Acute Rehab PT Goals Patient Stated Goal: get stronger PT Goal Formulation: With patient Time For Goal Achievement: 05/08/18 Potential to Achieve Goals: Fair    Frequency Min 3X/week   Barriers to discharge        Co-evaluation               AM-PAC PT "6 Clicks" Mobility  Outcome Measure Help needed turning from your back to your side while in a flat bed without using bedrails?: A Little Help needed moving from lying on your back to sitting on the side of a flat bed without using  bedrails?: A Little Help needed moving to and from a bed to a chair (including a wheelchair)?: A Lot Help needed standing up from a chair using your arms (e.g., wheelchair or bedside chair)?: A Lot Help needed to walk in hospital room?: A Lot Help needed climbing 3-5 steps with a railing? : Total 6 Click Score: 13    End of Session Equipment Utilized During Treatment: Gait belt;Oxygen(5L) Activity Tolerance: Patient tolerated treatment well Patient left: in bed;with call bell/phone within reach;with restraints reapplied Nurse Communication: Mobility status PT Visit Diagnosis: Unsteadiness on feet (R26.81)    Time: 3893-7342 PT Time Calculation (min) (ACUTE ONLY): 23 min   Charges:   PT Evaluation $PT Eval Moderate Complexity: 1 Mod PT Treatments $Therapeutic Activity: 8-22 mins        Reinaldo Berber, PT, DPT Acute Rehabilitation Services Pager: [phone] Office: [phone]     Reinaldo Berber 04/24/2018, 5:05 PM

## 2018-04-25 ENCOUNTER — Other Ambulatory Visit: Payer: Self-pay

## 2018-04-25 LAB — GLUCOSE, CAPILLARY
GLUCOSE-CAPILLARY: 144 mg/dL — AB (ref 70–99)
Glucose-Capillary: 120 mg/dL — ABNORMAL HIGH (ref 70–99)
Glucose-Capillary: 149 mg/dL — ABNORMAL HIGH (ref 70–99)
Glucose-Capillary: 114 mg/dL — ABNORMAL HIGH (ref 70–99)
Glucose-Capillary: 108 mg/dL — ABNORMAL HIGH (ref 70–99)

## 2018-04-25 LAB — BASIC METABOLIC PANEL
Anion gap: 8 (ref 5–15)
BUN: 6 mg/dL — ABNORMAL LOW (ref 8–23)
CO2: 27 mmol/L (ref 22–32)
Calcium: 8.9 mg/dL (ref 8.9–10.3)
Chloride: 106 mmol/L (ref 98–111)
Creatinine, Ser: 0.76 mg/dL (ref 0.61–1.24)
GFR calc Af Amer: >60 mL/min (ref >60)
GFR calc non Af Amer: >60 mL/min (ref >60)
Glucose, Bld: 126 mg/dL — ABNORMAL HIGH (ref 70–99)
POTASSIUM: 3.5 mmol/L (ref 3.5–5.1)
SODIUM: 141 mmol/L (ref 135–145)

## 2018-04-25 LAB — PHOSPHORUS
PHOSPHORUS: 2.2 mg/dL — AB (ref 2.5–4.6)
Phosphorus: 1.8 mg/dL — ABNORMAL LOW (ref 2.5–4.6)

## 2018-04-25 LAB — MAGNESIUM
MAGNESIUM: 1.9 mg/dL (ref 1.7–2.4)
Magnesium: 2 mg/dL (ref 1.7–2.4)

## 2018-04-25 MED ORDER — HALOPERIDOL LACTATE 5 MG/ML IJ SOLN
2.0000 mg | Freq: Once | INTRAMUSCULAR | Status: AC
  Administered 2018-04-26: 2 mg via INTRAVENOUS
  Filled 2018-04-25: qty 1

## 2018-04-25 MED ORDER — POTASSIUM & SODIUM PHOSPHATES 280-160-250 MG PO PACK
1.0000 | PACK | Freq: Three times a day (TID) | ORAL | Status: AC
  Administered 2018-04-25 – 2018-04-26 (×5): 1
  Filled 2018-04-25 (×6): qty 1

## 2018-04-25 MED ORDER — IPRATROPIUM-ALBUTEROL 0.5-2.5 (3) MG/3ML IN SOLN
3.0000 mL | Freq: Two times a day (BID) | RESPIRATORY_TRACT | Status: DC
  Administered 2018-04-25 – 2018-05-02 (×15): 3 mL via RESPIRATORY_TRACT
  Filled 2018-04-25 (×15): qty 3

## 2018-04-25 MED ORDER — POTASSIUM CHLORIDE 20 MEQ PO PACK
60.0000 meq | PACK | Freq: Once | ORAL | Status: AC
  Administered 2018-04-25: 60 meq via ORAL
  Filled 2018-04-25: qty 3

## 2018-04-25 MED ORDER — K PHOS MONO-SOD PHOS DI & MONO 155-852-130 MG PO TABS
250.0000 mg | ORAL_TABLET | Freq: Three times a day (TID) | ORAL | Status: DC
  Filled 2018-04-25: qty 1

## 2018-04-25 NOTE — Progress Notes (Signed)
Physical Therapy Treatment Patient Details Name: Kurt [name] MRN: [id number] DOB: 08-05-1940 Today's Date: 04/25/2018    History of Present Illness 78 year old male with history of COPD, atrial fibrillation, tobacco use, BPH presented with complaints of cough, shortness of breath and fevers with T-max of 102F x2 days and was noted to be in A. fib with RVR.  Patient admitted for COPD exacerbation with pneumonia/hypoxic respiratory failure requiring BiPAP therapy.  He also has poor dentition and is on aspiration precautions/coverage.  His flu and CoVid19 testing resulted negative.     PT Comments    Assisted patient back to bed from bedside chair, moving chair and allowing 5' of distance of walking with hand held guarding, min to mod A for stability. Patient cont to fatigue quickly but overall tolerating more activity today on less O2. HR better controled 90-115 today.     Follow Up Recommendations  CIR     Equipment Recommendations       Recommendations for Other Services OT consult     Precautions / Restrictions Precautions Precautions: Fall Restrictions Weight Bearing Restrictions: No    Mobility  Bed Mobility Overal bed mobility: Needs Assistance Bed Mobility: Supine to Sit     Supine to sit: Min guard        Transfers Overall transfer level: Needs assistance Equipment used: 1 person hand held assist;Rolling walker (2 wheeled) Transfers: Sit to/from Stand Sit to Stand: Min assist         General transfer comment: assited back to bed from chair with min A  Ambulation/Gait Ambulation/Gait assistance: Min assist Gait Distance (Feet): 5 Feet Assistive device: 1 person hand held assist Gait Pattern/deviations: Step-to pattern Gait velocity: decreased   General Gait Details: patient took several steps back to bed from chair with BUE suport on therapist and furinture, looking to preamturely sit onto bed. able to scoot himself to better position on  bed   Stairs             Wheelchair Mobility    Modified Rankin (Stroke Patients Only)       Balance Overall balance assessment: Needs assistance   Sitting balance-Leahy Scale: Poor       Standing balance-Leahy Scale: Poor                              Cognition Arousal/Alertness: Awake/alert Behavior During Therapy: WFL for tasks assessed/performed Overall Cognitive Status: Within Functional Limits for tasks assessed                                 General Comments: improving cogntion, impulsive      Exercises      General Comments        Pertinent Vitals/Pain Pain Assessment: No/denies pain    Home Living   Living Arrangements: Spouse/significant other;Children                  Prior Function            PT Goals (current goals can now be found in the care plan section) Acute Rehab PT Goals Patient Stated Goal: get stronger PT Goal Formulation: With patient Time For Goal Achievement: 05/08/18 Potential to Achieve Goals: Fair Progress towards PT goals: Progressing toward goals    Frequency    Min 3X/week      PT Plan Current plan remains appropriate  Co-evaluation              AM-PAC PT "6 Clicks" Mobility   Outcome Measure  Help needed turning from your back to your side while in a flat bed without using bedrails?: A Little Help needed moving from lying on your back to sitting on the side of a flat bed without using bedrails?: A Little Help needed moving to and from a bed to a chair (including a wheelchair)?: A Lot Help needed standing up from a chair using your arms (e.g., wheelchair or bedside chair)?: A Lot Help needed to walk in hospital room?: A Lot Help needed climbing 3-5 steps with a railing? : Total 6 Click Score: 13    End of Session Equipment Utilized During Treatment: Gait belt;Oxygen Activity Tolerance: Patient tolerated treatment well Patient left: in bed;with call  bell/phone within reach;with restraints reapplied Nurse Communication: Mobility status PT Visit Diagnosis: Unsteadiness on feet (R26.81)     Time: 0354-6568 PT Time Calculation (min) (ACUTE ONLY): 8 min  Charges:  $Therapeutic Activity: 8-22 mins                     Reinaldo Berber, PT, DPT Acute Rehabilitation Services Pager: [phone] Office: [phone]    Reinaldo Berber 04/25/2018, 4:14 PM

## 2018-04-25 NOTE — Progress Notes (Signed)
Attempted to call pt daughters per pt request. Phone ringed, no one responded.

## 2018-04-25 NOTE — Progress Notes (Signed)
PROGRESS NOTE    Kurt [name]  QIW:[id number]  DOB: 1940-05-03  DOA: 04/28/2018 PCP: [name], NP  Brief Narrative:  78 year old male with history of COPD, atrial fibrillation, tobacco use, BPH presented with complaints of cough, shortness of breath and fevers with T-max of 102F x2 days and was noted to be in A. fib with RVR.  Patient admitted for COPD exacerbation with pneumonia/hypoxic respiratory failure requiring BiPAP therapy.  He also has poor dentition and is on aspiration precautions/coverage.  His flu and CoVid19 testing resulted negative.   Subjective: Patient seen alongside patient's nurse.  Patient continues to report shortness of breath and cough that is productive of whitish phlegm.  Patient is a poor historian.  Patient is cachectic and looks weak.  Objective: Vitals:   04/25/18 0712 04/25/18 0800 04/25/18 0851 04/25/18 0900  BP:  102/72 102/72 114/82  Pulse:  96 96 (!) 106  Resp:  (!) 30 20 18   Temp: 97.8 F (36.6 C)     TempSrc: Axillary     SpO2:  97% 97% 99%  Weight:      Height:        Intake/Output Summary (Last 24 hours) at 04/25/2018 1034 Last data filed at 04/25/2018 0900 Gross per 24 hour  Intake 1394.03 ml  Output 915 ml  Net 479.03 ml   Filed Weights   04/23/18 0500 04/24/18 0436 04/25/18 0500  Weight: 63.6 kg 63.2 kg 62.9 kg    Physical Examination:  General exam: Cachectic.  Weak.  Cortrak tube in situ.  Awake and alert.   Respiratory system: Clear to auscultation.   Cardiovascular system: S1 & S2 heard, mildly tachycardic. Gastrointestinal system: Abdomen is nondistended, soft and nontender.  Organs were not palpable. Central nervous system: Awake and alert.  Patient moves all limbs.   neurological deficits. Extremities: No leg edema.  Data Reviewed: I have personally reviewed following labs and imaging studies  CBC: Recent Labs  Lab 04/28/2018 1932 04/28/2018 1949 04/20/18 0406 04/21/18 0230 04/22/18 0516 04/24/18 1141    WBC 9.4  --  8.8 11.9* 7.8 6.2  NEUTROABS 8.1*  --  7.9*  --   --   --   HGB 13.1 12.9* 11.3* 10.3* 9.8* 12.0*  HCT 39.7 38.0* 34.4* 31.4* 31.0* 35.6*  MCV 96.8  --  97.2 96.6 97.2 94.2  PLT 420*  --  347 287 279 417   Basic Metabolic Panel: Recent Labs  Lab 04/21/18 0230 04/22/18 0516 04/23/18 0258 04/24/18 1141 04/24/18 1142 04/24/18 1833 04/25/18 0444  NA 144 144 139 140  --   --  141  K 3.9 3.4* 3.8 3.4*  --   --  3.5  CL 109 106 108 104  --   --  106  CO2 27 33* 23 26  --   --  27  GLUCOSE 130* 113* 72 102*  --   --  126*  BUN 17 8 5* <5*  --   --  6*  CREATININE 0.78 0.73 0.69 0.68  --   --  0.76  CALCIUM 9.9 9.3 8.6* 8.8*  --   --  8.9  MG 2.1  --  1.8  --  1.8 1.9 1.9  PHOS 2.9  --  2.0*  --  2.5 2.2* 2.2*   GFR: Estimated Creatinine Clearance: 66.6 mL/min (by C-G formula based on SCr of 0.76 mg/dL). Liver Function Tests: Recent Labs  Lab 04/14/2018 1932 04/20/18 0406 04/21/18 0230 04/23/18 0258  AST  21 21 25   --   ALT 14 14 18   --   ALKPHOS 52 47 40  --   BILITOT 1.5* 0.9 0.6  --   PROT 7.6 6.4* 5.8*  --   ALBUMIN 2.7* 2.2* 2.0* 2.0*   No results for input(s): LIPASE, AMYLASE in the last 168 hours. No results for input(s): AMMONIA in the last 168 hours. Coagulation Profile: No results for input(s): INR, PROTIME in the last 168 hours. Cardiac Enzymes: Recent Labs  Lab 05/04/2018 2250  TROPONINI <0.03   BNP (last 3 results) No results for input(s): PROBNP in the last 8760 hours. HbA1C: No results for input(s): HGBA1C in the last 72 hours. CBG: Recent Labs  Lab 04/24/18 1523 04/24/18 2147 04/24/18 2345 04/25/18 0402 04/25/18 0711  GLUCAP 74 91 105* 120* 144*   Lipid Profile: No results for input(s): CHOL, HDL, LDLCALC, TRIG, CHOLHDL, LDLDIRECT in the last 72 hours. Thyroid Function Tests: No results for input(s): TSH, T4TOTAL, FREET4, T3FREE, THYROIDAB in the last 72 hours. Anemia Panel: No results for input(s): VITAMINB12, FOLATE,  FERRITIN, TIBC, IRON, RETICCTPCT in the last 72 hours. Sepsis Labs: Recent Labs  Lab 04/15/2018 2233 04/20/2018 2250 04/20/18 0650  PROCALCITON  --  0.82  --   LATICACIDVEN 3.7*  --  1.7    Recent Results (from the past 240 hour(s))  Blood culture (routine x 2)     Status: None   Collection Time: 05/02/2018  7:15 PM  Result Value Ref Range Status   Specimen Description BLOOD RIGHT ANTECUBITAL  Final   Special Requests   Final    BOTTLES DRAWN AEROBIC AND ANAEROBIC Blood Culture results may not be optimal due to an inadequate volume of blood received in culture bottles   Culture   Final    NO GROWTH 5 DAYS Performed at [facility], [place] [address], [place], [place] [address]    Report Status 04/24/2018 FINAL  Final  Blood culture (routine x 2)     Status: None   Collection Time: 05/04/2018  7:35 PM  Result Value Ref Range Status   Specimen Description BLOOD BLOOD RIGHT FOREARM  Final   Special Requests   Final    BOTTLES DRAWN AEROBIC AND ANAEROBIC Blood Culture results may not be optimal due to an inadequate volume of blood received in culture bottles   Culture   Final    NO GROWTH 5 DAYS Performed at [facility], [place] [address], [place],  [address]    Report Status 04/24/2018 FINAL  Final  Respiratory Panel by PCR     Status: None   Collection Time: 04/23/2018  7:58 PM  Result Value Ref Range Status   Adenovirus NOT DETECTED NOT DETECTED Final   Coronavirus 229E NOT DETECTED NOT DETECTED Final    Comment: (NOTE) The Coronavirus on the Respiratory Panel, DOES NOT test for the novel  Coronavirus (2019 nCoV)    Coronavirus HKU1 NOT DETECTED NOT DETECTED Final   Coronavirus NL63 NOT DETECTED NOT DETECTED Final   Coronavirus OC43 NOT DETECTED NOT DETECTED Final   Metapneumovirus NOT DETECTED NOT DETECTED Final   Rhinovirus / Enterovirus NOT DETECTED NOT DETECTED Final   Influenza A NOT DETECTED NOT DETECTED Final   Influenza B NOT DETECTED NOT DETECTED Final    Parainfluenza Virus 1 NOT DETECTED NOT DETECTED Final   Parainfluenza Virus 2 NOT DETECTED NOT DETECTED Final   Parainfluenza Virus 3 NOT DETECTED NOT DETECTED Final   Parainfluenza  Virus 4 NOT DETECTED NOT DETECTED Final   Respiratory Syncytial Virus NOT DETECTED NOT DETECTED Final   Bordetella pertussis NOT DETECTED NOT DETECTED Final   Chlamydophila pneumoniae NOT DETECTED NOT DETECTED Final   Mycoplasma pneumoniae NOT DETECTED NOT DETECTED Final    Comment: Performed at [facility], [place] [address], [place], [place] [address]  Novel Coronavirus, NAA (hospital order; send-out to ref lab)     Status: None   Collection Time: 04/28/2018  7:58 PM  Result Value Ref Range Status   SARS-CoV-2, NAA Not Detected Not Detected Final    Comment: (NOTE) Testing was performed using the cobas(R) SARS-CoV-2 test. This test was developed and its performance characteristics determined by [employer]. This test has not been FDA cleared or approved. This test has been authorized by FDA under an Emergency Use Authorization (EUA). This test is only authorized for the duration of time the declaration that circumstances exist justifying the authorization of the emergency use of in vitro diagnostic tests for detection of SARS-CoV-2 virus and/or diagnosis of COVID-19 infection under section 564(b)(1) of the Act, 21 U.S.C. 101BPZ-0(C)(5), unless the authorization is terminated or revoked sooner. Performed At: [facility] [address] [place], [place] [id number] Rush Farmer MD [id number]    Coronavirus Source NASAL SWAB  Final    Comment: Performed at [facility], [place] [address] [place], [place] [address]  Urine culture     Status: None   Collection Time: 04/20/18  3:55 AM  Result Value Ref Range Status   Specimen Description URINE, RANDOM  Final   Special Requests NONE  Final   Culture   Final    NO GROWTH Performed at [facility], [place] [address],  [place], [place] [address]    Report Status 04/21/2018 FINAL  Final  Expectorated sputum assessment w rflx to resp cult     Status: None   Collection Time: 04/20/18  4:00 AM  Result Value Ref Range Status   Specimen Description SPUTUM  Final   Special Requests NONE  Final   Sputum evaluation   Final    Sputum specimen not acceptable for testing.  Please recollect.   Gram Stain Report Called to,Read Back By and Verified With: RN K WHITE @ [phone] 04/20/18 BY Tobey Bride Performed at [facility], [place] [address], [place], [place] [address]    Report Status 04/20/2018 FINAL  Final  MRSA PCR Screening     Status: None   Collection Time: 04/20/18  4:05 AM  Result Value Ref Range Status   MRSA by PCR NEGATIVE NEGATIVE Final    Comment:        The GeneXpert MRSA Assay (FDA approved for NASAL specimens only), is one component of a comprehensive MRSA colonization surveillance program. It is not intended to diagnose MRSA infection nor to guide or monitor treatment for MRSA infections. Performed at [facility], [place] [address] [place], [place] [address]   Expectorated sputum assessment w rflx to resp cult     Status: None   Collection Time: 04/20/18 11:04 AM  Result Value Ref Range Status   Specimen Description SPUTUM  Final   Special Requests NONE  Final   Sputum evaluation   Final    THIS SPECIMEN IS ACCEPTABLE FOR SPUTUM CULTURE Performed at [facility], [place] [address] [place], [place] [address]    Report Status 04/20/2018 FINAL  Final  Culture, respiratory  Status: None   Collection Time: 04/20/18 11:04 AM  Result Value Ref Range Status   Specimen Description SPUTUM  Final   Special Requests NONE Reflexed from [id number]  Final   Gram Stain   Final    FEW WBC PRESENT, PREDOMINANTLY PMN FEW GRAM POSITIVE COCCI RARE YEAST    Culture   Final    FEW Consistent with normal respiratory flora. Performed at [facility], [place] [address], [place], [place] [address]      Report Status 04/22/2018 FINAL  Final      Radiology Studies: Dg Abd 1 View  Result Date: 04/24/2018 CLINICAL DATA:  Feeding tube placement EXAM: ABDOMEN - 1 VIEW COMPARISON:  None. FINDINGS: Feeding tube has been placed to the proximal duodenum, confirmed with contrast injection. IMPRESSION: Feeding tube placement to proximal duodenum. Electronically Signed   By: Lucrezia Europe M.D.   On: 04/24/2018 13:43   Dg Chest Port 1 View  Result Date: 04/23/2018 CLINICAL DATA:  History of COPD. Patient admitted 04/20/2018 after cough, fever and shortness of breath for 2 days. EXAM: PORTABLE CHEST 1 VIEW COMPARISON:  Single-view of the chest 04/09/2018. PA and lateral chest 03/03/2018. FINDINGS: The lungs are emphysematous. Bilateral mid and lower lung zone airspace disease has worsened since the most recent examination. No pneumothorax. There are likely small bilateral pleural effusions. Heart size is enlarged. Aortic atherosclerosis noted. No acute or focal bony abnormality. IMPRESSION: Worsened bilateral airspace disease and small bilateral pleural effusions could be due to increased pulmonary edema and/or pneumonia. Emphysema. Electronically Signed   By: Inge Rise M.D.   On: 04/23/2018 11:53        Scheduled Meds:  apixaban  5 mg Per Tube BID   chlorhexidine  15 mL Mouth Rinse BID   Chlorhexidine Gluconate Cloth  6 each Topical Q0600   diltiazem  40 mg Per Tube Q8H   ipratropium-albuterol  3 mL Nebulization BID   mouth rinse  15 mL Mouth Rinse q12n4p   mirabegron ER  50 mg Oral Daily   mometasone-formoterol  2 puff Inhalation BID   nicotine  14 mg Transdermal Daily   pantoprazole sodium  40 mg Per Tube Daily   tamsulosin  0.4 mg Oral QPC supper   Continuous Infusions:  sodium chloride Stopped (04/25/18 0453)   ampicillin-sulbactam (UNASYN) IV Stopped (04/25/18 0527)   feeding supplement (JEVITY 1.2 CAL) 50 mL/hr at 04/25/18 0400    Assessment & Plan:    1.  Acute  hypoxic respiratory failure/sepsis: POA. Secondary to bacterial pneumonia vs aspiration. Viral etiology ruled out.  Continue Unasyn, to finish azithromycin (day 5).  Continue periodic deep oral suctioning for secretions which seems to be helping much.  D/W Speech therapy who advised of persistent aspiration risk and recommended NPO. Will try Cortrak and initiate tube feeds and ST to continue following. Change MDI to nebs as -ve CoVid 19 test results. Repeat CXR suggestive of worsening infiltrates vs pulmonary edema. D/C fluids, keep NPO.Free water via NG if possible. Taper 02 as tolerated 04/25/2018: Continue current management.  Patient seems to be improving.  2.  Altered mental status/delirium: Likely metabolic encephalopathy secondary to problem #1. He is requiring mittens and lap restraints PRN but much more awake.alert today.Holding Neurontin for now in concern for sedation.  Hopefully can come off restraints in next 24 hours.  04/25/2018: Patient seems to be improving.  3.  COPD: Can transition MDI to nebs. Otherwise management as described in problem #1  4.  Atrial fibrillation with RVR: On oral Cardizem, will have IV metoprolol PRN available.  On apixaban to be given via NG tube 04/25/2018: Heart rate is reasonably controlled  5.  BPH: Tamsulosin  6.  Tobacco use: Will place on nicotine patch in case of withdrawal leading to problem #2  7. Stage 2 decubitus: Foam dressing is in place and pt is on a low air-loss bed to reduce pressure. Wound care following  8. Mild hypokalemia; replace  DVT prophylaxis: On apixaban Code Status: DNR per record Family / Patient Communication: No family present bedside Disposition Plan: To be decided.     LOS: 6 days    Time spent: 35 minutes    Bonnell Public, MD [facility] Pager [phone]   If 7PM-7AM, please contact night-coverage [email or web address] Password [facility] 04/25/2018, 10:34 AM

## 2018-04-25 NOTE — Progress Notes (Signed)
Physical Therapy Treatment Patient Details Name: Kurt [name] MRN: [id number] DOB: 28-Mar-1940 Today's Date: 04/25/2018    History of Present Illness 78 year old male with history of COPD, atrial fibrillation, tobacco use, BPH presented with complaints of cough, shortness of breath and fevers with T-max of 102F x2 days and was noted to be in A. fib with RVR.  Patient admitted for COPD exacerbation with pneumonia/hypoxic respiratory failure requiring BiPAP therapy.  He also has poor dentition and is on aspiration precautions/coverage.  His flu and CoVid19 testing resulted negative.     PT Comments    Patient progressing with therapy today, requiring min A to stand and able to tolerate transfer to bedside chair. HR 90-100 with activity this session. Pt remains weak and unsteady on his feet, cont to rec CIR   Follow Up Recommendations  CIR     Equipment Recommendations       Recommendations for Other Services OT consult     Precautions / Restrictions Precautions Precautions: Fall Restrictions Weight Bearing Restrictions: No    Mobility  Bed Mobility Overal bed mobility: Needs Assistance Bed Mobility: Supine to Sit     Supine to sit: Min guard        Transfers Overall transfer level: Needs assistance Equipment used: 1 person hand held assist;Rolling walker (2 wheeled) Transfers: Sit to/from Stand Sit to Stand: Min assist         General transfer comment: min A to power up and transfer to chair guarding. patient weak  Ambulation/Gait                 Stairs             Wheelchair Mobility    Modified Rankin (Stroke Patients Only)       Balance Overall balance assessment: Needs assistance   Sitting balance-Leahy Scale: Poor       Standing balance-Leahy Scale: Poor                              Cognition Arousal/Alertness: Awake/alert Behavior During Therapy: WFL for tasks assessed/performed Overall Cognitive Status: Within  Functional Limits for tasks assessed                                 General Comments: improving cogntion, impulsive      Exercises      General Comments        Pertinent Vitals/Pain Pain Assessment: No/denies pain    Home Living   Living Arrangements: Spouse/significant other;Children                  Prior Function            PT Goals (current goals can now be found in the care plan section) Acute Rehab PT Goals Patient Stated Goal: get stronger PT Goal Formulation: With patient Time For Goal Achievement: 05/08/18 Potential to Achieve Goals: Fair Progress towards PT goals: Progressing toward goals    Frequency    Min 3X/week      PT Plan Current plan remains appropriate    Co-evaluation              AM-PAC PT "6 Clicks" Mobility   Outcome Measure  Help needed turning from your back to your side while in a flat bed without using bedrails?: A Little Help needed moving from lying on your back to sitting  on the side of a flat bed without using bedrails?: A Little Help needed moving to and from a bed to a chair (including a wheelchair)?: A Lot Help needed standing up from a chair using your arms (e.g., wheelchair or bedside chair)?: A Lot Help needed to walk in hospital room?: A Lot Help needed climbing 3-5 steps with a railing? : Total 6 Click Score: 13    End of Session Equipment Utilized During Treatment: Gait belt;Oxygen Activity Tolerance: Patient tolerated treatment well Patient left: in bed;with call bell/phone within reach;with restraints reapplied Nurse Communication: Mobility status PT Visit Diagnosis: Unsteadiness on feet (R26.81)     Time: 1500-1520 PT Time Calculation (min) (ACUTE ONLY): 20 min  Charges:  $Therapeutic Activity: 8-22 mins                     Reinaldo Berber, PT, DPT Acute Rehabilitation Services Pager: [phone] Office: [phone]     Reinaldo Berber 04/25/2018, 3:43 PM

## 2018-04-26 LAB — CBC WITH DIFFERENTIAL/PLATELET
Abs Immature Granulocytes: 0.04 10*3/uL (ref 0.00–0.07)
Basophils Absolute: 0 10*3/uL (ref 0.0–0.1)
Basophils Relative: 0 %
Eosinophils Absolute: 0 10*3/uL (ref 0.0–0.5)
Eosinophils Relative: 1 %
HCT: 35.6 % — ABNORMAL LOW (ref 39.0–52.0)
Hemoglobin: 11.6 g/dL — ABNORMAL LOW (ref 13.0–17.0)
Immature Granulocytes: 1 %
Lymphocytes Relative: 17 %
Lymphs Abs: 1.1 10*3/uL (ref 0.7–4.0)
MCH: 30.9 pg (ref 26.0–34.0)
MCHC: 32.6 g/dL (ref 30.0–36.0)
MCV: 94.9 fL (ref 80.0–100.0)
Monocytes Absolute: 1 10*3/uL (ref 0.1–1.0)
Monocytes Relative: 16 %
Neutro Abs: 4.3 10*3/uL (ref 1.7–7.7)
Neutrophils Relative %: 65 %
Platelets: 322 10*3/uL (ref 150–400)
RBC: 3.75 MIL/uL — ABNORMAL LOW (ref 4.22–5.81)
RDW: 13.9 % (ref 11.5–15.5)
WBC: 6.5 10*3/uL (ref 4.0–10.5)
nRBC: 0 % (ref 0.0–0.2)

## 2018-04-26 LAB — RENAL FUNCTION PANEL
Albumin: 2 g/dL — ABNORMAL LOW (ref 3.5–5.0)
Anion gap: 9 (ref 5–15)
BUN: 8 mg/dL (ref 8–23)
CO2: 23 mmol/L (ref 22–32)
Calcium: 8.8 mg/dL — ABNORMAL LOW (ref 8.9–10.3)
Chloride: 108 mmol/L (ref 98–111)
Creatinine, Ser: 0.71 mg/dL (ref 0.61–1.24)
GFR calc Af Amer: >60 mL/min (ref >60)
GFR calc non Af Amer: >60 mL/min (ref >60)
Glucose, Bld: 100 mg/dL — ABNORMAL HIGH (ref 70–99)
Phosphorus: 2.2 mg/dL — ABNORMAL LOW (ref 2.5–4.6)
Potassium: 4.4 mmol/L (ref 3.5–5.1)
Sodium: 140 mmol/L (ref 135–145)

## 2018-04-26 LAB — GLUCOSE, CAPILLARY
GLUCOSE-CAPILLARY: 116 mg/dL — AB (ref 70–99)
Glucose-Capillary: 104 mg/dL — ABNORMAL HIGH (ref 70–99)
Glucose-Capillary: 106 mg/dL — ABNORMAL HIGH (ref 70–99)
Glucose-Capillary: 120 mg/dL — ABNORMAL HIGH (ref 70–99)
Glucose-Capillary: 143 mg/dL — ABNORMAL HIGH (ref 70–99)
Glucose-Capillary: 149 mg/dL — ABNORMAL HIGH (ref 70–99)
Glucose-Capillary: 90 mg/dL (ref 70–99)

## 2018-04-26 LAB — MAGNESIUM: Magnesium: 2.1 mg/dL (ref 1.7–2.4)

## 2018-04-26 MED ORDER — QUETIAPINE FUMARATE 25 MG PO TABS
25.0000 mg | ORAL_TABLET | Freq: Two times a day (BID) | ORAL | Status: DC
  Administered 2018-04-26 – 2018-04-27 (×4): 25 mg via JEJUNOSTOMY
  Filled 2018-04-26 (×4): qty 1

## 2018-04-26 NOTE — Evaluation (Addendum)
Occupational Therapy Evaluation Patient Details Name: Kurt [name] MRN: [id number] DOB: 23-Aug-1940 Today's Date: 04/26/2018    History of Present Illness 78 year old male with history of COPD, atrial fibrillation, tobacco use, BPH presented with complaints of cough, shortness of breath and fevers with T-max of 102F x2 days and was noted to be in A. fib with RVR.  Patient admitted for COPD exacerbation with pneumonia/hypoxic respiratory failure requiring BiPAP therapy.  He also has poor dentition and is on aspiration precautions/coverage.  His flu and CoVid19 testing resulted negative.    Clinical Impression   Patient supine in bed upon therapist entering room, noted incontinent of BM and requires +2 assist for hygiene mgmt. Admitted for above and limited by decreased activity tolerance, impaired cognition, generalized weakness, impaired balance, and safety.  Patient requires min assist for UB ADLs and grooming, total assist for toileting, mod assist for basic sit to stand transfers, max- total assist for LB ADLs.  Pt able to follow 1 step commands with increased time, but inconsistently, difficult to discern if patient declined to participate or confusion is limiting participation.   When attempting orientation testing, patient voicing "you ask me every day" and refused to state his name. Limited evaluation. Home setup on and PLOF based on PT eval, pt with limited verbalizations and engagement today. Based on performance today, recommend continued OT services while admitted and after dc at intensive CIR level rehab in order to optimize independence and safety with ADLs and mobility.     Follow Up Recommendations  CIR    Equipment Recommendations  3 in 1 bedside commode    Recommendations for Other Services Rehab consult     Precautions / Restrictions Precautions Precautions: Fall Precaution Comments: ng tube Restrictions Weight Bearing Restrictions: No      Mobility Bed  Mobility Overal bed mobility: Needs Assistance Bed Mobility: Supine to Sit;Sit to Supine     Supine to sit: Mod assist;HOB elevated Sit to supine: Min guard   General bed mobility comments: pt requires  mod assist to ascend trunk to sit EOB, min guard to return to supine  Transfers Overall transfer level: Needs assistance Equipment used: 1 person hand held assist;Rolling walker (2 wheeled) Transfers: Sit to/from Stand Sit to Stand: Mod assist         General transfer comment: mod assist to power up into standing from EOB, attempted side stepping towards [facility] but patient declined    Balance Overall balance assessment: Needs assistance Sitting-balance support: No upper extremity supported;Feet supported Sitting balance-Leahy Scale: Poor     Standing balance support: During functional activity;Single extremity supported Standing balance-Leahy Scale: Poor Standing balance comment: relaint on external support                           ADL either performed or assessed with clinical judgement   ADL Overall ADL's : Needs assistance/impaired     Grooming: Wash/dry face;Wash/dry hands;Minimal assistance;Sitting Grooming Details (indicate cue type and reason): requires assist to ensure hygiene (BM on hands)  Upper Body Bathing: Minimal assistance;Sitting   Lower Body Bathing: Moderate assistance;Sit to/from stand Lower Body Bathing Details (indicate cue type and reason): decreased dynamic balance and functional reach to feet  Upper Body Dressing : Sitting;Moderate assistance Upper Body Dressing Details (indicate cue type and reason): to change gown  Lower Body Dressing: Sit to/from stand;Bed level;Maximal assistance Lower Body Dressing Details (indicate cue type and reason): total assist to change socks, requires  mod assist sit<>Stand    Toilet Transfer Details (indicate cue type and reason): deferred, pt declined  Toileting- Water quality scientist and Hygiene: Total  assistance;+2 for physical assistance;Bed level Toileting - Clothing Manipulation Details (indicate cue type and reason): incontinent BM in bed, +2 to assist with hygiene        General ADL Comments: pt limited by cognition, generalized weakness; limited participation during session      Vision   Additional Comments: NT, further assessment may be needed     Perception     Praxis      Pertinent Vitals/Pain Pain Assessment: No/denies pain     Hand Dominance     Extremity/Trunk Assessment Upper Extremity Assessment Upper Extremity Assessment: Generalized weakness   Lower Extremity Assessment Lower Extremity Assessment: Defer to PT evaluation       Communication Communication Communication: Other (comment)(limited verbalization, whispers )   Cognition Arousal/Alertness: Lethargic Behavior During Therapy: Agitated;Flat affect Overall Cognitive Status: No family/caregiver present to determine baseline cognitive functioning Area of Impairment: Following commands;Awareness;Problem solving;Safety/judgement                       Following Commands: Follows one step commands inconsistently;Follows one step commands with increased time Safety/Judgement: Decreased awareness of safety;Decreased awareness of deficits Awareness: Intellectual Problem Solving: Slow processing;Decreased initiation;Difficulty sequencing;Requires verbal cues;Requires tactile cues General Comments: difficult to asssess, pt with limited verbalizations but requires increased time to process and follow commands; decreased awareness of deficits; when asking orientation questions pt reports "you ask me that every day" and refused to participate    General Comments  VSS during session     Exercises     Shoulder Instructions      Home Living Family/patient expects to be discharged to:: Private residence Living Arrangements: Spouse/significant other;Children Available Help at Discharge:  Family Type of Home: House Home Access: Stairs to enter                         Additional Comments: limited participation in eval, further information required      Prior Functioning/Environment Level of Independence: Independent        Comments: daughter lives with him.         OT Problem List: Decreased strength;Decreased activity tolerance;Impaired balance (sitting and/or standing);Decreased coordination;Decreased cognition;Decreased safety awareness;Decreased knowledge of use of DME or AE;Decreased knowledge of precautions      OT Treatment/Interventions: Self-care/ADL training;Therapeutic exercise;DME and/or AE instruction;Therapeutic activities;Cognitive remediation/compensation;Patient/family education;Balance training    OT Goals(Current goals can be found in the care plan section) Acute Rehab OT Goals Patient Stated Goal: none stated OT Goal Formulation: With patient Time For Goal Achievement: 05/10/18 Potential to Achieve Goals: Good  OT Frequency: Min 2X/week   Barriers to D/C:            Co-evaluation              AM-PAC OT "6 Clicks" Daily Activity     Outcome Measure Help from another person eating meals?: Total Help from another person taking care of personal grooming?: A Little Help from another person toileting, which includes using toliet, bedpan, or urinal?: Total Help from another person bathing (including washing, rinsing, drying)?: A Lot Help from another person to put on and taking off regular upper body clothing?: A Little Help from another person to put on and taking off regular lower body clothing?: A Lot 6 Click Score: 12   End of Session  Equipment Utilized During Treatment: Gait belt;Oxygen Nurse Communication: Mobility status  Activity Tolerance: Patient limited by fatigue Patient left: in bed;with call bell/phone within reach;with bed alarm set  OT Visit Diagnosis: Other abnormalities of gait and mobility  (R26.89);Muscle weakness (generalized) (M62.81);Other symptoms and signs involving cognitive function                Time: 1030-1055 OT Time Calculation (min): 25 min Charges:  OT General Charges $OT Visit: 1 Visit OT Evaluation $OT Eval Moderate Complexity: 1 Mod OT Treatments $Self Care/Home Management : 8-22 mins  Delight Stare, OT Acute Rehabilitation Services Pager [phone] Office [phone]   Delight Stare 04/26/2018, 11:09 AM

## 2018-04-26 NOTE — Plan of Care (Signed)
  Problem: Activity: Goal: Ability to tolerate increased activity will improve Outcome: Not Progressing   Problem: Clinical Measurements: Goal: Ability to maintain a body temperature in the normal range will improve Outcome: Progressing   Problem: Respiratory: Goal: Ability to maintain adequate ventilation will improve Outcome: Progressing   Problem: Education: Goal: Knowledge of General Education information will improve Description Including pain rating scale, medication(s)/side effects and non-pharmacologic comfort measures Outcome: Not Progressing   Problem: Clinical Measurements: Goal: Respiratory complications will improve Outcome: Progressing   Problem: Nutrition: Goal: Adequate nutrition will be maintained Outcome: Progressing

## 2018-04-26 NOTE — Progress Notes (Signed)
SLP Cancellation Note  Patient Details Name: Kurt [name] MRN: [id number] DOB: February 18, 1940   Cancelled treatment:       Reason Eval/Treat Not Completed: Fatigue/lethargy limiting ability to participate; pt somnolent.  Will continue efforts.    Juan Quam Laurice 04/26/2018, 2:14 PM

## 2018-04-26 NOTE — Progress Notes (Signed)
Ok to add 10d stop date to Unasyn per Dr Nevada Crane.  Onnie Boer, PharmD, BCIDP, AAHIVP, CPP Infectious Disease Pharmacist 04/26/2018 11:40 AM

## 2018-04-26 NOTE — Progress Notes (Signed)
PROGRESS NOTE  Kurt [name] RKY:[id number] DOB: [birth year] DOA: 04/21/2018 PCP: [name], NP  HPI/Recap of past 42 hours: 78 year old male with history of COPD, atrial fibrillation, tobacco use, BPH presented with complaints of cough, shortness of breath and fevers with T-max of 102F x2 days and was noted to be in A. fib with RVR.  Patient admitted for COPD exacerbation with pneumonia/hypoxic respiratory failure requiring BiPAP therapy.  He also has poor dentition and is on aspiration precautions/coverage.  His flu and CoVid19 testing resulted negative.   04/26/18: Patient seen and examined at his bedside.  Confusion and agitation overnight reported by RN.  This morning he is alert but confused and does not answer questions appropriately.  Assessment/Plan: Principal Problem:   Acute respiratory failure with hypoxia (HCC) Active Problems:   Atrial fibrillation with RVR (HCC)   COPD exacerbation (HCC)   Community acquired pneumonia   Pressure injury of skin   Protein-calorie malnutrition, severe  Acute hypoxic respiratory failure secondary to bilateral community-acquired pneumonia versus aspiration Viral etiology ruled out On Unasyn for aspiration pneumonia Continue n.p.o. Has cortrack in place for tube feeding Continue to monitor O2 saturation Maintain O2 saturation greater than 92%  Suspected aspiration pneumonia Continue Unasyn Continue n.p.o. until cleared by speech therapy Has contract in place for feeding Afebrile no leukocytosis Continue to monitor fever curve and WBC Repeat CBC in the morning  Acute metabolic encephalopathy, possible hospital delirium Continue IV antibiotics for aspiration pneumonia Started on Seroquel 25 mg twice daily Continue to closely monitor  Chronic A. fib with occasional RVR Continue diltiazem for rate control Continue Eliquis for CVA prevention  BPH Continue tamsulosin Continue to monitor urine output Has condom catheter in  place  Dysphagia Speech therapist evaluated recommended n.p.o. Aspiration precautions Maintain head of bed elevated greater than 30 degree angle  Physical debility/ambulatory dysfunction PT assessed and recommended inpatient rehab Inpatient rehab consult placed Fall precautions  Severe protein calorie malnutrition Albumin 2.0 and BMI 18 Oral supplement through core track    DVT prophylaxis: On apixaban Code Status: DNR per record Family / Patient Communication: No family present bedside Disposition Plan:  Possible CIR   Objective: Vitals:   04/25/18 2308 04/26/18 0338 04/26/18 0544 04/26/18 0738  BP: 112/82 102/69  (!) 103/57  Pulse: 60 63  98  Resp: 20 (!) 26  20  Temp: 98 F (36.7 C) 98.1 F (36.7 C)  97.9 F (36.6 C)  TempSrc: Oral Oral  Oral  SpO2: 100% 96%  98%  Weight:   63.2 kg   Height:        Intake/Output Summary (Last 24 hours) at 04/26/2018 1047 Last data filed at 04/26/2018 0500 Gross per 24 hour  Intake 1445 ml  Output 975 ml  Net 470 ml   Filed Weights   04/24/18 0436 04/25/18 0500 04/26/18 0544  Weight: 63.2 kg 62.9 kg 63.2 kg    Exam:  . General: 78 y.o. year-old male thin built.  Alert and confused. . Cardiovascular: Regular rate and rhythm with no rubs or gallops.  No thyromegaly or JVD noted.  Coretrack in place. Marland Kitchen Respiratory: Clear to auscultation with no wheezes or rales. Good inspiratory effort. . Abdomen: Soft nontender nondistended with normal bowel sounds x4 quadrants. . Psychiatry: Mood is appropriate for condition and setting   Data Reviewed: CBC: Recent Labs  Lab 04/13/2018 1932  04/20/18 0406 04/21/18 0230 04/22/18 0516 04/24/18 1141 04/26/18 0344  WBC 9.4  --  8.8 11.9*  7.8 6.2 6.5  NEUTROABS 8.1*  --  7.9*  --   --   --  4.3  HGB 13.1   < > 11.3* 10.3* 9.8* 12.0* 11.6*  HCT 39.7   < > 34.4* 31.4* 31.0* 35.6* 35.6*  MCV 96.8  --  97.2 96.6 97.2 94.2 94.9  PLT 420*  --  347 287 279 337 322   < > = values in this  interval not displayed.   Basic Metabolic Panel: Recent Labs  Lab 04/22/18 0516 04/23/18 0258 04/24/18 1141 04/24/18 1142 04/24/18 1833 04/25/18 0444 04/25/18 1834 04/26/18 0344  NA 144 139 140  --   --  141  --  140  K 3.4* 3.8 3.4*  --   --  3.5  --  4.4  CL 106 108 104  --   --  106  --  108  CO2 33* 23 26  --   --  27  --  23  GLUCOSE 113* 72 102*  --   --  126*  --  100*  BUN 8 5* <5*  --   --  6*  --  8  CREATININE 0.73 0.69 0.68  --   --  0.76  --  0.71  CALCIUM 9.3 8.6* 8.8*  --   --  8.9  --  8.8*  MG  --  1.8  --  1.8 1.9 1.9 2.0 2.1  PHOS  --  2.0*  --  2.5 2.2* 2.2* 1.8* 2.2*   GFR: Estimated Creatinine Clearance: 66.9 mL/min (by C-G formula based on SCr of 0.71 mg/dL). Liver Function Tests: Recent Labs  Lab 04/18/2018 1932 04/20/18 0406 04/21/18 0230 04/23/18 0258 04/26/18 0344  AST 21 21 25   --   --   ALT 14 14 18   --   --   ALKPHOS 52 47 40  --   --   BILITOT 1.5* 0.9 0.6  --   --   PROT 7.6 6.4* 5.8*  --   --   ALBUMIN 2.7* 2.2* 2.0* 2.0* 2.0*   No results for input(s): LIPASE, AMYLASE in the last 168 hours. No results for input(s): AMMONIA in the last 168 hours. Coagulation Profile: No results for input(s): INR, PROTIME in the last 168 hours. Cardiac Enzymes: Recent Labs  Lab 05/02/2018 2250  TROPONINI <0.03   BNP (last 3 results) No results for input(s): PROBNP in the last 8760 hours. HbA1C: No results for input(s): HGBA1C in the last 72 hours. CBG: Recent Labs  Lab 04/25/18 1638 04/25/18 2034 04/26/18 0028 04/26/18 0344 04/26/18 0842  GLUCAP 114* 108* 106* 90 149*   Lipid Profile: No results for input(s): CHOL, HDL, LDLCALC, TRIG, CHOLHDL, LDLDIRECT in the last 72 hours. Thyroid Function Tests: No results for input(s): TSH, T4TOTAL, FREET4, T3FREE, THYROIDAB in the last 72 hours. Anemia Panel: No results for input(s): VITAMINB12, FOLATE, FERRITIN, TIBC, IRON, RETICCTPCT in the last 72 hours. Urine analysis:    Component Value  Date/Time   COLORURINE STRAW (A) 01/15/2016 1316   APPEARANCEUR CLEAR 01/15/2016 1316   LABSPEC 1.002 (L) 01/15/2016 1316   PHURINE 6.0 01/15/2016 1316   GLUCOSEU NEGATIVE 01/15/2016 1316   GLUCOSEU NEGATIVE 03/29/2010 1611   HGBUR NEGATIVE 01/15/2016 1316   BILIRUBINUR NEGATIVE 01/15/2016 1316   KETONESUR NEGATIVE 01/15/2016 1316   PROTEINUR NEGATIVE 01/15/2016 1316   UROBILINOGEN 0.2 03/29/2010 1611   NITRITE NEGATIVE 01/15/2016 1316   LEUKOCYTESUR NEGATIVE 01/15/2016 1316   Sepsis Labs: @LABRCNTIP (procalcitonin:4,lacticidven:4)  )  Recent Results (from the past 240 hour(s))  Blood culture (routine x 2)     Status: None   Collection Time: 05/02/2018  7:15 PM  Result Value Ref Range Status   Specimen Description BLOOD RIGHT ANTECUBITAL  Final   Special Requests   Final    BOTTLES DRAWN AEROBIC AND ANAEROBIC Blood Culture results may not be optimal due to an inadequate volume of blood received in culture bottles   Culture   Final    NO GROWTH 5 DAYS Performed at [facility], [place] [address], [place], [place] [address]    Report Status 04/24/2018 FINAL  Final  Blood culture (routine x 2)     Status: None   Collection Time: 04/18/2018  7:35 PM  Result Value Ref Range Status   Specimen Description BLOOD BLOOD RIGHT FOREARM  Final   Special Requests   Final    BOTTLES DRAWN AEROBIC AND ANAEROBIC Blood Culture results may not be optimal due to an inadequate volume of blood received in culture bottles   Culture   Final    NO GROWTH 5 DAYS Performed at [facility], [place] [address] [place], [place] [address]    Report Status 04/24/2018 FINAL  Final  Respiratory Panel by PCR     Status: None   Collection Time: 05/04/2018  7:58 PM  Result Value Ref Range Status   Adenovirus NOT DETECTED NOT DETECTED Final   Coronavirus 229E NOT DETECTED NOT DETECTED Final    Comment: (NOTE) The Coronavirus on the Respiratory Panel, DOES NOT test for the novel  Coronavirus (2019 nCoV)     Coronavirus HKU1 NOT DETECTED NOT DETECTED Final   Coronavirus NL63 NOT DETECTED NOT DETECTED Final   Coronavirus OC43 NOT DETECTED NOT DETECTED Final   Metapneumovirus NOT DETECTED NOT DETECTED Final   Rhinovirus / Enterovirus NOT DETECTED NOT DETECTED Final   Influenza A NOT DETECTED NOT DETECTED Final   Influenza B NOT DETECTED NOT DETECTED Final   Parainfluenza Virus 1 NOT DETECTED NOT DETECTED Final   Parainfluenza Virus 2 NOT DETECTED NOT DETECTED Final   Parainfluenza Virus 3 NOT DETECTED NOT DETECTED Final   Parainfluenza Virus 4 NOT DETECTED NOT DETECTED Final   Respiratory Syncytial Virus NOT DETECTED NOT DETECTED Final   Bordetella pertussis NOT DETECTED NOT DETECTED Final   Chlamydophila pneumoniae NOT DETECTED NOT DETECTED Final   Mycoplasma pneumoniae NOT DETECTED NOT DETECTED Final    Comment: Performed at [facility], [address] [place], [place] [address]  Novel Coronavirus, NAA (hospital order; send-out to ref lab)     Status: None   Collection Time: 04/29/2018  7:58 PM  Result Value Ref Range Status   SARS-CoV-2, NAA Not Detected Not Detected Final    Comment: (NOTE) Testing was performed using the cobas(R) SARS-CoV-2 test. This test was developed and its performance characteristics determined by [employer]. This test has not been FDA cleared or approved. This test has been authorized by FDA under an Emergency Use Authorization (EUA). This test is only authorized for the duration of time the declaration that circumstances exist justifying the authorization of the emergency use of in vitro diagnostic tests for detection of SARS-CoV-2 virus and/or diagnosis of COVID-19 infection under section 564(b)(1) of the Act, 21 U.S.C. 850YDX-4(J)(2), unless the authorization is terminated or revoked sooner. Performed At: [facility] [address] [place], [place] [id number] Rush Farmer MD NO:[id number]    [place]  Final  Comment: Performed at [facility], [place] [address], [place], [place] [address]  Urine culture     Status: None   Collection Time: 04/20/18  3:55 AM  Result Value Ref Range Status   Specimen Description URINE, RANDOM  Final   Special Requests NONE  Final   Culture   Final    NO GROWTH Performed at [facility], [place] [address], [place], [place] [address]    Report Status 04/21/2018 FINAL  Final  Expectorated sputum assessment w rflx to resp cult     Status: None   Collection Time: 04/20/18  4:00 AM  Result Value Ref Range Status   Specimen Description SPUTUM  Final   Special Requests NONE  Final   Sputum evaluation   Final    Sputum specimen not acceptable for testing.  Please recollect.   Gram Stain Report Called to,Read Back By and Verified With: RN K WHITE @ [phone] 04/20/18 BY Tobey Bride Performed at [facility], [place] [address] [place], [place] [address]    Report Status 04/20/2018 FINAL  Final  MRSA PCR Screening     Status: None   Collection Time: 04/20/18  4:05 AM  Result Value Ref Range Status   MRSA by PCR NEGATIVE NEGATIVE Final    Comment:        The GeneXpert MRSA Assay (FDA approved for NASAL specimens only), is one component of a comprehensive MRSA colonization surveillance program. It is not intended to diagnose MRSA infection nor to guide or monitor treatment for MRSA infections. Performed at [facility], [place] [address], [place], [place] [address]   Expectorated sputum assessment w rflx to resp cult     Status: None   Collection Time: 04/20/18 11:04 AM  Result Value Ref Range Status   Specimen Description SPUTUM  Final   Special Requests NONE  Final   Sputum evaluation   Final    THIS SPECIMEN IS ACCEPTABLE FOR SPUTUM CULTURE Performed at [facility], [place] [address] [place], [place] [address]    Report Status 04/20/2018 FINAL  Final  Culture, respiratory     Status: None   Collection Time: 04/20/18 11:04 AM  Result  Value Ref Range Status   Specimen Description SPUTUM  Final   Special Requests NONE Reflexed from [id number]  Final   Gram Stain   Final    FEW WBC PRESENT, PREDOMINANTLY PMN FEW GRAM POSITIVE COCCI RARE YEAST    Culture   Final    FEW Consistent with normal respiratory flora. Performed at [facility], [place] [address], [place], [place] [address]    Report Status 04/22/2018 FINAL  Final      Studies: No results found.  Scheduled Meds: . apixaban  5 mg Per Tube BID  . chlorhexidine  15 mL Mouth Rinse BID  . Chlorhexidine Gluconate Cloth  6 each Topical Q0600  . diltiazem  40 mg Per Tube Q8H  . ipratropium-albuterol  3 mL Nebulization BID  . mouth rinse  15 mL Mouth Rinse q12n4p  . mirabegron ER  50 mg Oral Daily  . mometasone-formoterol  2 puff Inhalation BID  . nicotine  14 mg Transdermal Daily  . pantoprazole sodium  40 mg Per Tube Daily  . potassium & sodium phosphates  1 packet Per Tube TID  . tamsulosin  0.4 mg Oral QPC supper    Continuous Infusions: . sodium chloride Stopped (04/25/18 0453)  . ampicillin-sulbactam (  UNASYN) IV 3 g (04/26/18 8325)  . feeding supplement (JEVITY 1.2 CAL) 1,000 mL (04/26/18 0318)     LOS: 7 days     Kayleen Memos, MD [facility] Pager [phone]  If 7PM-7AM, please contact night-coverage [email or web address] Password [email or web address] 04/26/2018, 10:47 AM

## 2018-04-27 ENCOUNTER — Inpatient Hospital Stay (HOSPITAL_COMMUNITY): Payer: Medicare Other

## 2018-04-27 LAB — CBC WITH DIFFERENTIAL/PLATELET
Abs Immature Granulocytes: 0.05 10*3/uL (ref 0.00–0.07)
BASOS ABS: 0 10*3/uL (ref 0.0–0.1)
Basophils Relative: 0 %
Eosinophils Absolute: 0.1 10*3/uL (ref 0.0–0.5)
Eosinophils Relative: 1 %
HEMATOCRIT: 33.3 % — AB (ref 39.0–52.0)
Hemoglobin: 10.9 g/dL — ABNORMAL LOW (ref 13.0–17.0)
Immature Granulocytes: 1 %
Lymphocytes Relative: 14 %
Lymphs Abs: 1 10*3/uL (ref 0.7–4.0)
MCH: 31.7 pg (ref 26.0–34.0)
MCHC: 32.7 g/dL (ref 30.0–36.0)
MCV: 96.8 fL (ref 80.0–100.0)
Monocytes Absolute: 0.8 10*3/uL (ref 0.1–1.0)
Monocytes Relative: 11 %
Neutro Abs: 5.3 10*3/uL (ref 1.7–7.7)
Neutrophils Relative %: 73 %
Platelets: 351 10*3/uL (ref 150–400)
RBC: 3.44 MIL/uL — ABNORMAL LOW (ref 4.22–5.81)
RDW: 14.3 % (ref 11.5–15.5)
WBC: 7.2 10*3/uL (ref 4.0–10.5)
nRBC: 0 % (ref 0.0–0.2)

## 2018-04-27 LAB — GLUCOSE, CAPILLARY
GLUCOSE-CAPILLARY: 90 mg/dL (ref 70–99)
GLUCOSE-CAPILLARY: 122 mg/dL — AB (ref 70–99)
Glucose-Capillary: 106 mg/dL — ABNORMAL HIGH (ref 70–99)
Glucose-Capillary: 137 mg/dL — ABNORMAL HIGH (ref 70–99)
Glucose-Capillary: 137 mg/dL — ABNORMAL HIGH (ref 70–99)
Glucose-Capillary: 94 mg/dL (ref 70–99)

## 2018-04-27 LAB — LACTIC ACID, PLASMA: LACTIC ACID, VENOUS: 1 mmol/L (ref 0.5–1.9)

## 2018-04-27 LAB — PROCALCITONIN: Procalcitonin: <0.1 ng/mL

## 2018-04-27 MED ORDER — FUROSEMIDE 10 MG/ML IJ SOLN
20.0000 mg | Freq: Two times a day (BID) | INTRAMUSCULAR | Status: DC
  Administered 2018-04-27 – 2018-04-30 (×8): 20 mg via INTRAVENOUS
  Filled 2018-04-27 (×9): qty 2

## 2018-04-27 MED ORDER — DILTIAZEM 12 MG/ML ORAL SUSPENSION
30.0000 mg | Freq: Three times a day (TID) | ORAL | Status: DC
  Administered 2018-04-27 – 2018-05-01 (×13): 30 mg
  Filled 2018-04-27 (×16): qty 3

## 2018-04-27 NOTE — Progress Notes (Signed)
Physical Therapy Treatment Patient Details Name: Kurt [name] MRN: [id number] DOB: 23-Dec-1940 Today's Date: 04/27/2018    History of Present Illness 78 year old male with history of COPD, atrial fibrillation, tobacco use, BPH presented with complaints of cough, shortness of breath and fevers with T-max of 102F x2 days and was noted to be in A. fib with RVR.  Patient admitted for COPD exacerbation with pneumonia/hypoxic respiratory failure requiring BiPAP therapy.  He also has poor dentition and is on aspiration precautions/coverage.  His flu and CoVid19 testing resulted negative.     PT Comments    Patient requiring increased 02 today as well as increased WOB. Pt changed from nonrebreather to 8L/min 02 prior to session. Tolerated sitting EOB for short period of time due to fatigue and 3/4 DOE. Fatigues very quickly. HR ranged from 113-143 bpm A-fib, Sp02 ranged from 90-96% on 8L/min 02, RR 35-46 and BP post activity 106/84. Pt with low raspy voice, difficult to understand at times. Irritated when asked orientation questions. Limited activity tolerance today. Pt declines OOB to chair or any other activity due to fatigue and SOB. Will continue to follow and progress as tolerated.   Follow Up Recommendations  CIR     Equipment Recommendations  Other (comment)(defer)    Recommendations for Other Services       Precautions / Restrictions Precautions Precautions: Fall Precaution Comments: ng tube Restrictions Weight Bearing Restrictions: No    Mobility  Bed Mobility Overal bed mobility: Needs Assistance Bed Mobility: Supine to Sit;Sit to Supine     Supine to sit: Min guard;HOB elevated Sit to supine: Min assist;HOB elevated   General bed mobility comments: Able to get to EOB with increased time, assist to bring RLE into bed. Assist to scoot bottom along bed for better positioning.   Transfers                 General transfer comment: Deferred secondary to  fatigue.  Ambulation/Gait                 Stairs             Wheelchair Mobility    Modified Rankin (Stroke Patients Only)       Balance Overall balance assessment: Needs assistance Sitting-balance support: Feet supported;No upper extremity supported Sitting balance-Leahy Scale: Poor Sitting balance - Comments: Able to sit EOB ~seconds at a time then with posterior lean and needing support; fatigues quickly.                                     Cognition Arousal/Alertness: Awake/alert Behavior During Therapy: Flat affect Overall Cognitive Status: No family/caregiver present to determine baseline cognitive functioning                                 General Comments: Pt immediately irritated when asked orientation questions. Knows he is at [facility]. Does not know the date. Difficult to understand at times secondaryt o low, raspy voice.       Exercises      General Comments General comments (skin integrity, edema, etc.): HR ranged from 113/143 bpm A-fib, Sp02 ranged from 90-96% on 8L/min 02, RR 35-46, BP post activity 106/84      Pertinent Vitals/Pain Pain Assessment: No/denies pain    Home Living  Prior Function            PT Goals (current goals can now be found in the care plan section) Progress towards PT goals: Not progressing toward goals - comment(secondary to increased need for 02, DOE, fatigue)    Frequency    Min 3X/week      PT Plan Current plan remains appropriate    Co-evaluation PT/OT/SLP Co-Evaluation/Treatment: Yes Reason for Co-Treatment: Complexity of the patient's impairments (multi-system involvement);Necessary to address cognition/behavior during functional activity PT goals addressed during session: Mobility/safety with mobility        AM-PAC PT "6 Clicks" Mobility   Outcome Measure  Help needed turning from your back to your side while in a flat bed without  using bedrails?: A Little Help needed moving from lying on your back to sitting on the side of a flat bed without using bedrails?: A Little Help needed moving to and from a bed to a chair (including a wheelchair)?: A Lot Help needed standing up from a chair using your arms (e.g., wheelchair or bedside chair)?: A Lot Help needed to walk in hospital room?: A Lot Help needed climbing 3-5 steps with a railing? : Total 6 Click Score: 13    End of Session Equipment Utilized During Treatment: Oxygen Activity Tolerance: Patient limited by fatigue Patient left: in bed;with call bell/phone within reach;with bed alarm set Nurse Communication: Mobility status PT Visit Diagnosis: Unsteadiness on feet (R26.81);Other (comment)(dyspnea)     Time: 0300-9233 PT Time Calculation (min) (ACUTE ONLY): 18 min  Charges:  $Therapeutic Activity: 8-22 mins                     Wray Kearns, PT, DPT Acute Rehabilitation Services Pager [phone] Office [phone]       [place] 04/27/2018, 3:05 PM

## 2018-04-27 NOTE — Progress Notes (Signed)
Inpatient Rehabilitation Admissions Coordinator  Inpatient Rehab Consult received. I reviewed chart and spoke with pt's RN for rehabilitation assessment. Noted decline in condition today. Pt currently not at a level for consideration of an inpt rehab admit. I will follow his progress.  Danne Baxter, RN, MSN Rehab Admissions Coordinator [phone] 04/27/2018 10:54 AM

## 2018-04-27 NOTE — Progress Notes (Signed)
Occupational Therapy Treatment Patient Details Name: Kurt [name] MRN: [id number] DOB: September 02, 1940 Today's Date: 04/27/2018    History of present illness 78 year old male with history of COPD, atrial fibrillation, tobacco use, BPH presented with complaints of cough, shortness of breath and fevers with T-max of 102F x2 days and was noted to be in A. fib with RVR.  Patient admitted for COPD exacerbation with pneumonia/hypoxic respiratory failure requiring BiPAP therapy.  He also has poor dentition and is on aspiration precautions/coverage.  His flu and CoVid19 testing resulted negative.    OT comments  Patient with limited progress today, noted increased oxgyen needs.  RN changed pt from non-rebreather to L8L/min O2 prior to session.  Limited tolerance to EOB activity, fatigues easily and requires constant cueing for PLB techniques.  HR ranged from 113-143, SPo2 from 90-96% on 8L/min via Ewa Gentry, RR ranged from 35-46.  Continues to remain irritated with orientation questions, but following 1 step commands with increased time and asking appropriate questions to therapist. Able to complete simple grooming tasks with setup assist today.  Pt unable to tolerate further mobility and self care tasks today.  Will follow.   Follow Up Recommendations  CIR    Equipment Recommendations  3 in 1 bedside commode    Recommendations for Other Services Rehab consult    Precautions / Restrictions Precautions Precautions: Fall Precaution Comments: ng tube Restrictions Weight Bearing Restrictions: No       Mobility Bed Mobility Overal bed mobility: Needs Assistance Bed Mobility: Supine to Sit;Sit to Supine     Supine to sit: Min guard;HOB elevated Sit to supine: Min assist;HOB elevated   General bed mobility comments: Able to get to EOB with increased time, assist to bring RLE into bed. Assist to scoot bottom along bed for better positioning.   Transfers                 General transfer  comment: deferred due to fatigue     Balance Overall balance assessment: Needs assistance Sitting-balance support: Feet supported;No upper extremity supported Sitting balance-Leahy Scale: Poor Sitting balance - Comments: Able to sit EOB ~seconds at a time then with posterior lean and needing support; fatigues quickly.                                    ADL either performed or assessed with clinical judgement   ADL Overall ADL's : Needs assistance/impaired     Grooming: Wash/dry hands;Wash/dry face;Set up;Sitting                               Functional mobility during ADLs: Minimal assistance General ADL Comments: bed mobility only      Vision       Perception     Praxis      Cognition Arousal/Alertness: Awake/alert Behavior During Therapy: Flat affect Overall Cognitive Status: No family/caregiver present to determine baseline cognitive functioning Area of Impairment: Following commands;Awareness;Problem solving;Safety/judgement                       Following Commands: Follows one step commands consistently;Follows one step commands with increased time Safety/Judgement: Decreased awareness of safety;Decreased awareness of deficits Awareness: Intellectual Problem Solving: Slow processing;Decreased initiation;Difficulty sequencing;Requires verbal cues;Requires tactile cues General Comments: continues to decline answering orientation questions, knows he is at [facility].  difficult to understand with  low, raspy voice        Exercises     Shoulder Instructions       General Comments HR ranged from 113/143 bpm A-fib, Sp02 ranged from 90-96% on 8L/min 02, RR 35-46, BP post activity 106/84    Pertinent Vitals/ Pain       Pain Assessment: No/denies pain  Home Living                                          Prior Functioning/Environment              Frequency  Min 2X/week        Progress Toward Goals  OT  Goals(current goals can now be found in the care plan section)  Progress towards OT goals: Not progressing toward goals - comment(limited activity tolerance with increased oxgyen needs )  Acute Rehab OT Goals Patient Stated Goal: none stated OT Goal Formulation: With patient  Plan Discharge plan remains appropriate;Frequency remains appropriate    Co-evaluation    PT/OT/SLP Co-Evaluation/Treatment: Yes Reason for Co-Treatment: Complexity of the patient's impairments (multi-system involvement);Necessary to address cognition/behavior during functional activity;To address functional/ADL transfers PT goals addressed during session: Mobility/safety with mobility OT goals addressed during session: ADL's and self-care;Other (comment)(mobility)      AM-PAC OT "6 Clicks" Daily Activity     Outcome Measure   Help from another person eating meals?: Total Help from another person taking care of personal grooming?: A Little Help from another person toileting, which includes using toliet, bedpan, or urinal?: Total Help from another person bathing (including washing, rinsing, drying)?: A Lot Help from another person to put on and taking off regular upper body clothing?: A Lot Help from another person to put on and taking off regular lower body clothing?: A Lot 6 Click Score: 11    End of Session Equipment Utilized During Treatment: Oxygen  OT Visit Diagnosis: Other abnormalities of gait and mobility (R26.89);Muscle weakness (generalized) (M62.81);Other symptoms and signs involving cognitive function   Activity Tolerance Patient limited by fatigue   Patient Left in bed;with call bell/phone within reach;with bed alarm set   Nurse Communication Mobility status        Time: 4481-8563 OT Time Calculation (min): 17 min  Charges: OT General Charges $OT Visit: 1 Visit(visit only )  Delight Stare, [place] Pager [phone] Office [phone]     Delight Stare 04/27/2018, 4:03 PM

## 2018-04-27 NOTE — Progress Notes (Signed)
  Speech Language Pathology Treatment: Dysphagia  Patient Details Name: Kurt [name] MRN: [id number] DOB: May 23, 1940 Today's Date: 04/27/2018 Time: 2334-3568 SLP Time Calculation (min) (ACUTE ONLY): 12 min  Assessment / Plan / Recommendation Clinical Impression  SLP received call from RN this afternoon to come back to assess pt's readiness for POs. He continues to have reduced respiratory status as evidenced by intermittent desaturations (down to around 85%) and tachypnea despite being on NRB. He has audible secretions, with session primarily focused on clearance. Minimal amounts of ice chips were given after oral care to facilitate this, with pt exhibiting intermittent coughing. His reflexive and volitional coughing is all subjectively weak. Thin secretions are returned via yankauer after ice chips, suspicious for return of thin liquids from melted ice as opposed to true secretions. Pt was left with HOB elevated. Continue to think that he needs instrumental swallow study, but he is not yet stable from a respiratory standpoint. SLP also remains concerned that pt's symptoms over the last few months (trouble swallowing, vocal changes, throat pain, ear pain) could suggest an underlying disease process - MD may wish to assess further. For now would maintain NPO status.    HPI HPI: 78yo w/ a hx of GERD, COPD, PNA, atrial fibrillation, tobacco abuse, and BPH who presented w/ 2 days of shortness of breath.  CXR concerning for PNA in the bilateral bases. COVID-19 negative. Pt had a prior MBS in 2011 but results are not available. Pt describes several months of trouble swallowing, dysphonia, throat pain, and ear pain PTA.      SLP Plan  Continue with current plan of care       Recommendations  Diet recommendations: NPO Medication Administration: Via alternative means                Oral Care Recommendations: Oral care QID Follow up Recommendations: (tba) SLP Visit Diagnosis: Dysphagia,  unspecified (R13.10) Plan: Continue with current plan of care       GO                Kurt [name] 04/27/2018, 1:42 PM  Pollyann Glen, M.A. [employer] Acute Environmental education officer [phone] Office [phone]

## 2018-04-27 NOTE — Progress Notes (Addendum)
PROGRESS NOTE  Kurt [name] SFK:[id number] DOB: 09-23-1940 DOA: 04/10/2018 PCP: [name], NP  HPI/Recap of past 73 hours: 78 year old male with history of COPD, atrial fibrillation, tobacco use, BPH presented with complaints of cough, shortness of breath and fevers with T-max of 102F x2 days and was noted to be in A. fib with RVR.  Patient admitted for COPD exacerbation with pneumonia/hypoxic respiratory failure requiring BiPAP therapy.  He also has poor dentition and is on aspiration precautions/coverage.  His flu and CoVid19 testing resulted negative.   Hospital course complicated by confusion and agitation for which she was started on Seroquel 25 mg twice daily.  04/27/18: Patient seen and examined at bedside.  Increase O2 requirement overnight, now on high flow nasal cannula to maintain saturation greater than 90%.  Also reported abdominal pain with nausea.  TF turned off.  This morning he is somnolent but easily arousable to voices.  Stat chest x-ray showed left lower lobe infiltrates and mild increase in pulmonary vascularity.  Started on low-dose IV Lasix 20 mg twice daily. Abdominal x-ray unremarkable for any acute intra-abdominal findings.    Assessment/Plan: Principal Problem:   Acute respiratory failure with hypoxia (HCC) Active Problems:   Atrial fibrillation with RVR (HCC)   COPD exacerbation (HCC)   Community acquired pneumonia   Pressure injury of skin   Protein-calorie malnutrition, severe  Worsening acute hypoxic respiratory failure secondary to bilateral community-acquired pneumonia versus aspiration Viral etiology ruled out On Unasyn for aspiration pneumonia T-max 99.0 overnight No leukocytosis Lactic acid 1.0 and procalcitonin less than 0.10 on 04/27/2018 Increased oxygen demand overnight requiring high flow nasal cannula TF stopped Independently reviewed chest x-ray done on 04/27/2018 which revealed left lower lobe infiltrates and mild increase in  pulmonary vascularity Continue n.p.o. and start low-dose IV Lasix 20 mg twice daily Maintain O2 saturation greater than 92% Continuous pulse oximetry  Mild pulmonary edema suspect secondary to pneumonia IV Lasix started 20 mg twice daily Closely monitor vital signs to avoid hypotension  Abdominal pain/nausea, unclear etiology Abdominal x-ray done with no acute intra-abdominal findings IV Zofran PRN for nausea Keep TF Turned off for now Continue to monitor  Suspected aspiration pneumonia Continue Unasyn Continue n.p.o. Has contract in place for feeding Afebrile no leukocytosis  Acute metabolic encephalopathy, possible hospital delirium Continue IV antibiotics for aspiration pneumonia Continue Seroquel 25 mg twice daily Obtain ammonia level in the morning  Chronic A. fib with occasional RVR Continue diltiazem for rate control Continue Eliquis for CVA prevention  BPH Continue tamsulosin Continue to monitor urine output Has condom catheter in place  Dysphagia Speech therapist evaluated recommended n.p.o. Aspiration precautions Maintain head of bed elevated greater than 30 degree angle  Physical debility/ambulatory dysfunction PT assessed and recommended inpatient rehab Inpatient rehab consult placed Fall precautions  Severe protein calorie malnutrition Albumin 2.0 and BMI 18 Oral supplement through core track    DVT prophylaxis: On apixaban Code Status: DNR per record Family / Patient Communication: No family present bedside Disposition Plan:  Possible CIR   Objective: Vitals:   04/26/18 2315 04/27/18 0437 04/27/18 0816 04/27/18 0825  BP: 101/64  106/65   Pulse: 80  (!) 107   Resp: (!) 35  (!) 42   Temp: 98.9 F (37.2 C)  98.1 F (36.7 C)   TempSrc: Oral  Oral   SpO2: 97%  (!) 89% 90%  Weight:  61.8 kg    Height:        Intake/Output Summary (Last  24 hours) at 04/27/2018 0911 Last data filed at 04/27/2018 0400 Gross per 24 hour  Intake -  Output  1650 ml  Net -1650 ml   Filed Weights   04/25/18 0500 04/26/18 0544 04/27/18 0437  Weight: 62.9 kg 63.2 kg 61.8 kg    Exam:  . General: 78 y.o. year-old male thin built in no acute distress.  Alert and oriented x3.  Coretract in place. . Cardiovascular: Irregular rate and rhythm with no rubs or gallops.  No JVD or thyromegaly noted.   Marland Kitchen Respiratory: Mild rales at bases with no wheezes noted.  Poor inspiratory effort.   . Abdomen: Soft with normal bowel sounds x4 quadrants . Psychiatry: Mood is appropriate for condition and setting   Data Reviewed: CBC: Recent Labs  Lab 04/21/18 0230 04/22/18 0516 04/24/18 1141 04/26/18 0344 04/27/18 0644  WBC 11.9* 7.8 6.2 6.5 7.2  NEUTROABS  --   --   --  4.3 5.3  HGB 10.3* 9.8* 12.0* 11.6* 10.9*  HCT 31.4* 31.0* 35.6* 35.6* 33.3*  MCV 96.6 97.2 94.2 94.9 96.8  PLT 287 279 337 322 482   Basic Metabolic Panel: Recent Labs  Lab 04/22/18 0516 04/23/18 0258 04/24/18 1141 04/24/18 1142 04/24/18 1833 04/25/18 0444 04/25/18 1834 04/26/18 0344  NA 144 139 140  --   --  141  --  140  K 3.4* 3.8 3.4*  --   --  3.5  --  4.4  CL 106 108 104  --   --  106  --  108  CO2 33* 23 26  --   --  27  --  23  GLUCOSE 113* 72 102*  --   --  126*  --  100*  BUN 8 5* <5*  --   --  6*  --  8  CREATININE 0.73 0.69 0.68  --   --  0.76  --  0.71  CALCIUM 9.3 8.6* 8.8*  --   --  8.9  --  8.8*  MG  --  1.8  --  1.8 1.9 1.9 2.0 2.1  PHOS  --  2.0*  --  2.5 2.2* 2.2* 1.8* 2.2*   GFR: Estimated Creatinine Clearance: 65.4 mL/min (by C-G formula based on SCr of 0.71 mg/dL). Liver Function Tests: Recent Labs  Lab 04/21/18 0230 04/23/18 0258 04/26/18 0344  AST 25  --   --   ALT 18  --   --   ALKPHOS 40  --   --   BILITOT 0.6  --   --   PROT 5.8*  --   --   ALBUMIN 2.0* 2.0* 2.0*   No results for input(s): LIPASE, AMYLASE in the last 168 hours. No results for input(s): AMMONIA in the last 168 hours. Coagulation Profile: No results for input(s):  INR, PROTIME in the last 168 hours. Cardiac Enzymes: No results for input(s): CKTOTAL, CKMB, CKMBINDEX, TROPONINI in the last 168 hours. BNP (last 3 results) No results for input(s): PROBNP in the last 8760 hours. HbA1C: No results for input(s): HGBA1C in the last 72 hours. CBG: Recent Labs  Lab 04/26/18 1600 04/26/18 1933 04/26/18 2339 04/27/18 0321 04/27/18 0809  GLUCAP 143* 120* 116* 106* 90   Lipid Profile: No results for input(s): CHOL, HDL, LDLCALC, TRIG, CHOLHDL, LDLDIRECT in the last 72 hours. Thyroid Function Tests: No results for input(s): TSH, T4TOTAL, FREET4, T3FREE, THYROIDAB in the last 72 hours. Anemia Panel: No results for input(s): VITAMINB12, FOLATE, FERRITIN, TIBC, IRON,  RETICCTPCT in the last 72 hours. Urine analysis:    Component Value Date/Time   COLORURINE STRAW (A) 01/15/2016 1316   APPEARANCEUR CLEAR 01/15/2016 1316   LABSPEC 1.002 (L) 01/15/2016 1316   PHURINE 6.0 01/15/2016 1316   GLUCOSEU NEGATIVE 01/15/2016 1316   GLUCOSEU NEGATIVE 03/29/2010 1611   HGBUR NEGATIVE 01/15/2016 1316   BILIRUBINUR NEGATIVE 01/15/2016 1316   KETONESUR NEGATIVE 01/15/2016 1316   PROTEINUR NEGATIVE 01/15/2016 1316   UROBILINOGEN 0.2 03/29/2010 1611   NITRITE NEGATIVE 01/15/2016 1316   LEUKOCYTESUR NEGATIVE 01/15/2016 1316   Sepsis Labs: @LABRCNTIP (procalcitonin:4,lacticidven:4)  ) Recent Results (from the past 240 hour(s))  Blood culture (routine x 2)     Status: None   Collection Time: 04/28/2018  7:15 PM  Result Value Ref Range Status   Specimen Description BLOOD RIGHT ANTECUBITAL  Final   Special Requests   Final    BOTTLES DRAWN AEROBIC AND ANAEROBIC Blood Culture results may not be optimal due to an inadequate volume of blood received in culture bottles   Culture   Final    NO GROWTH 5 DAYS Performed at [facility], [place] [address] [place], [place] [address]    Report Status 04/24/2018 FINAL  Final  Blood culture (routine x 2)     Status: None    Collection Time: 04/16/2018  7:35 PM  Result Value Ref Range Status   Specimen Description BLOOD BLOOD RIGHT FOREARM  Final   Special Requests   Final    BOTTLES DRAWN AEROBIC AND ANAEROBIC Blood Culture results may not be optimal due to an inadequate volume of blood received in culture bottles   Culture   Final    NO GROWTH 5 DAYS Performed at [facility], [place] [address] [place], [place] [address]    Report Status 04/24/2018 FINAL  Final  Respiratory Panel by PCR     Status: None   Collection Time: 04/17/2018  7:58 PM  Result Value Ref Range Status   Adenovirus NOT DETECTED NOT DETECTED Final   Coronavirus 229E NOT DETECTED NOT DETECTED Final    Comment: (NOTE) The Coronavirus on the Respiratory Panel, DOES NOT test for the novel  Coronavirus (2019 nCoV)    Coronavirus HKU1 NOT DETECTED NOT DETECTED Final   Coronavirus NL63 NOT DETECTED NOT DETECTED Final   Coronavirus OC43 NOT DETECTED NOT DETECTED Final   Metapneumovirus NOT DETECTED NOT DETECTED Final   Rhinovirus / Enterovirus NOT DETECTED NOT DETECTED Final   Influenza A NOT DETECTED NOT DETECTED Final   Influenza B NOT DETECTED NOT DETECTED Final   Parainfluenza Virus 1 NOT DETECTED NOT DETECTED Final   Parainfluenza Virus 2 NOT DETECTED NOT DETECTED Final   Parainfluenza Virus 3 NOT DETECTED NOT DETECTED Final   Parainfluenza Virus 4 NOT DETECTED NOT DETECTED Final   Respiratory Syncytial Virus NOT DETECTED NOT DETECTED Final   Bordetella pertussis NOT DETECTED NOT DETECTED Final   Chlamydophila pneumoniae NOT DETECTED NOT DETECTED Final   Mycoplasma pneumoniae NOT DETECTED NOT DETECTED Final    Comment: Performed at [facility], [address] [place], [place] [address]  Novel Coronavirus, NAA (hospital order; send-out to ref lab)     Status: None   Collection Time: 04/16/2018  7:58 PM  Result Value Ref Range Status   SARS-CoV-2, NAA Not Detected Not Detected Final    Comment: (NOTE) Testing was performed  using the cobas(R) SARS-CoV-2 test. This test was developed and its performance characteristics determined by  [employer]. This test has not been FDA cleared or approved. This test has been authorized by FDA under an Emergency Use Authorization (EUA). This test is only authorized for the duration of time the declaration that circumstances exist justifying the authorization of the emergency use of in vitro diagnostic tests for detection of SARS-CoV-2 virus and/or diagnosis of COVID-19 infection under section 564(b)(1) of the Act, 21 U.S.C. 413KGM-0(N)(0), unless the authorization is terminated or revoked sooner. Performed At: [facility] [address] [place], [place] [id number] Rush Farmer MD IH:[id number]    Coronavirus Source NASAL SWAB  Final    Comment: Performed at [facility], [place] [address] [place], [place] [address]  Urine culture     Status: None   Collection Time: 04/20/18  3:55 AM  Result Value Ref Range Status   Specimen Description URINE, RANDOM  Final   Special Requests NONE  Final   Culture   Final    NO GROWTH Performed at [facility], [place] [address] [place], [place] [address]    Report Status 04/21/2018 FINAL  Final  Expectorated sputum assessment w rflx to resp cult     Status: None   Collection Time: 04/20/18  4:00 AM  Result Value Ref Range Status   Specimen Description SPUTUM  Final   Special Requests NONE  Final   Sputum evaluation   Final    Sputum specimen not acceptable for testing.  Please recollect.   Gram Stain Report Called to,Read Back By and Verified With: RN K WHITE @ [phone] 04/20/18 BY Tobey Bride Performed at [facility], [place] [address] [place], [place] [address]    Report Status 04/20/2018 FINAL  Final  MRSA PCR Screening     Status: None   Collection Time: 04/20/18  4:05 AM  Result Value Ref Range Status   MRSA by PCR NEGATIVE NEGATIVE Final    Comment:        The GeneXpert MRSA Assay (FDA  approved for NASAL specimens only), is one component of a comprehensive MRSA colonization surveillance program. It is not intended to diagnose MRSA infection nor to guide or monitor treatment for MRSA infections. Performed at [facility], [place] [address] [place], [place] [address]   Expectorated sputum assessment w rflx to resp cult     Status: None   Collection Time: 04/20/18 11:04 AM  Result Value Ref Range Status   Specimen Description SPUTUM  Final   Special Requests NONE  Final   Sputum evaluation   Final    THIS SPECIMEN IS ACCEPTABLE FOR SPUTUM CULTURE Performed at [facility], [place] [address], [place], [place] [address]    Report Status 04/20/2018 FINAL  Final  Culture, respiratory     Status: None   Collection Time: 04/20/18 11:04 AM  Result Value Ref Range Status   Specimen Description SPUTUM  Final   Special Requests NONE Reflexed from [id number]  Final   Gram Stain   Final    FEW WBC PRESENT, PREDOMINANTLY PMN FEW GRAM POSITIVE COCCI RARE YEAST    Culture   Final    FEW Consistent with normal respiratory flora. Performed at [facility], [place] [address] [place], [place] [address]    Report Status 04/22/2018 FINAL  Final      Studies: Dg Abd 1 View  Result Date: 04/27/2018 CLINICAL DATA:  Abdominal pain EXAM: ABDOMEN - 1 VIEW COMPARISON:  Fluoroscopy 3 days ago FINDINGS:  Feeding tube tip at the pylorus, unchanged. Bowel gas pattern is normal. No abnormal stool retention. No concerning mass effect or gas collection. Lower lobe airspace opacity as seen on contemporaneous chest x-ray IMPRESSION: Normal bowel gas pattern. Stable feeding tube position with tip at the pylorus. Electronically Signed   By: Monte Fantasia M.D.   On: 04/27/2018 07:11   Dg Chest Port 1 View  Result Date: 04/27/2018 CLINICAL DATA:  Hypoxia EXAM: PORTABLE CHEST 1 VIEW COMPARISON:  Four days ago FINDINGS: Feeding tube at least reaches the stomach. Improved aeration but  persistent dense airspace opacity at the bases. No generalized Kerley lines, effusion, or pneumothorax. IMPRESSION: Improved aeration but persistent dense lower lobe airspace/pneumonia. Electronically Signed   By: Monte Fantasia M.D.   On: 04/27/2018 06:48    Scheduled Meds: . apixaban  5 mg Per Tube BID  . chlorhexidine  15 mL Mouth Rinse BID  . Chlorhexidine Gluconate Cloth  6 each Topical Q0600  . diltiazem  30 mg Per Tube Q8H  . furosemide  20 mg Intravenous BID  . ipratropium-albuterol  3 mL Nebulization BID  . mouth rinse  15 mL Mouth Rinse q12n4p  . mirabegron ER  50 mg Oral Daily  . mometasone-formoterol  2 puff Inhalation BID  . nicotine  14 mg Transdermal Daily  . pantoprazole sodium  40 mg Per Tube Daily  . potassium & sodium phosphates  1 packet Per Tube TID  . QUEtiapine  25 mg Per J Tube BID  . tamsulosin  0.4 mg Oral QPC supper    Continuous Infusions: . sodium chloride Stopped (04/25/18 0453)  . ampicillin-sulbactam (UNASYN) IV 3 g (04/27/18 0438)  . feeding supplement (JEVITY 1.2 CAL) 1,000 mL (04/26/18 0318)     LOS: 8 days     Kayleen Memos, MD [facility] Pager [phone]  If 7PM-7AM, please contact night-coverage [email or web address] Password [facility] 04/27/2018, 9:11 AM

## 2018-04-27 NOTE — Progress Notes (Signed)
Daily Notes  Received report from [employer] around 1600. Patient is difficult to understand due to soft vocals. Unasyn and lasix administered. Patient provided with a pencil and paper to write down questions moving forward. Per review of chart will require SNF placement and is still on NPO status. All need met during the remainder of shift.

## 2018-04-27 NOTE — Progress Notes (Signed)
SLP Cancellation Note  Patient Details Name: Kurt [name] MRN: [id number] DOB: 15-Nov-1940   Cancelled treatment:       Reason Eval/Treat Not Completed: Medical issues which prohibited therapy. Pt with increased difficulty breathing, now on NRB. Discussed with RN - will hold diagnostic PO trials this morning. Will continue to follow as able.   Venita Sheffield Olamae Ferrara 04/27/2018, 9:22 AM  Pollyann Glen, M.A. [place] Acute Environmental education officer [phone] Office [phone]

## 2018-04-27 NOTE — Consult Note (Signed)
[place] Nurse wound consult note Patient receiving care in [facility] 2W15.  No visitors present.  Assisted by his primary NT. Reason for Consult: DPTIs to buttocks on admission and a stage 2 to the sacrum Wound type: No wounds found at time of this assessment.  The patient does have skin to his buttocks that is darkened, but it is not purple or maroon.  The patient has a sacral foam dressing covering the appropriate area.   Dressing procedure/placement/frequency: Use no rinse cleanser after incontinence episodes and Sween 24 Moisturizer in the pink and white tube in clean utility.  Turn the patient every 2 hours.  Place a pillow between his knees and ankels.  Monitor the wound area(s) for worsening of condition such as: Signs/symptoms of infection,  Increase in size,  Development of or worsening of odor, Development of pain, or increased pain at the affected locations.  Notify the medical team if any of these develop.  Thank you for the consult.  Discussed plan of care with the patient and bedside nurse.  [place] nurse will not follow at this time.  Please re-consult the [place] team if needed.  Val Riles, RN, MSN, CWOCN, CNS-BC, pager [phone]

## 2018-04-27 NOTE — Progress Notes (Signed)
Requiring increased O2 and sat's remaining in 80s.  Receiving breathing treatment and no improvement.  C/o of nausea and stomach ache and received zofran.  TF turned off.  [facility] paged.

## 2018-04-28 ENCOUNTER — Inpatient Hospital Stay (HOSPITAL_COMMUNITY): Payer: Medicare Other

## 2018-04-28 LAB — BASIC METABOLIC PANEL
Anion gap: 13 (ref 5–15)
BUN: 9 mg/dL (ref 8–23)
CO2: 32 mmol/L (ref 22–32)
Calcium: 9.5 mg/dL (ref 8.9–10.3)
Chloride: 97 mmol/L — ABNORMAL LOW (ref 98–111)
Creatinine, Ser: 0.9 mg/dL (ref 0.61–1.24)
GFR calc Af Amer: >60 mL/min (ref >60)
Glucose, Bld: 148 mg/dL — ABNORMAL HIGH (ref 70–99)
Potassium: 4.1 mmol/L (ref 3.5–5.1)
Sodium: 142 mmol/L (ref 135–145)

## 2018-04-28 LAB — CBC
HCT: 33.9 % — ABNORMAL LOW (ref 39.0–52.0)
Hemoglobin: 11.2 g/dL — ABNORMAL LOW (ref 13.0–17.0)
MCH: 31.8 pg (ref 26.0–34.0)
MCHC: 33 g/dL (ref 30.0–36.0)
MCV: 96.3 fL (ref 80.0–100.0)
PLATELETS: 373 10*3/uL (ref 150–400)
RBC: 3.52 MIL/uL — ABNORMAL LOW (ref 4.22–5.81)
RDW: 14.3 % (ref 11.5–15.5)
WBC: 10 10*3/uL (ref 4.0–10.5)
nRBC: 0 % (ref 0.0–0.2)

## 2018-04-28 LAB — BLOOD GAS, ARTERIAL
Acid-Base Excess: 8.5 mmol/L — ABNORMAL HIGH (ref 0.0–2.0)
Bicarbonate: 33.5 mmol/L — ABNORMAL HIGH (ref 20.0–28.0)
Drawn by: 350431
O2 Content: 8 L/min
O2 Saturation: 95.6 %
Patient temperature: 98
pCO2 arterial: 54.3 mmHg — ABNORMAL HIGH (ref 32.0–48.0)
pH, Arterial: 7.405 (ref 7.350–7.450)
pO2, Arterial: 82.5 mmHg — ABNORMAL LOW (ref 83.0–108.0)

## 2018-04-28 LAB — GLUCOSE, CAPILLARY
Glucose-Capillary: 125 mg/dL — ABNORMAL HIGH (ref 70–99)
Glucose-Capillary: 131 mg/dL — ABNORMAL HIGH (ref 70–99)

## 2018-04-28 MED ORDER — SODIUM CHLORIDE 3 % IN NEBU
4.0000 mL | INHALATION_SOLUTION | Freq: Three times a day (TID) | RESPIRATORY_TRACT | Status: DC
  Administered 2018-04-28 – 2018-04-29 (×3): 4 mL via RESPIRATORY_TRACT
  Filled 2018-04-28 (×4): qty 4

## 2018-04-28 MED ORDER — GUAIFENESIN-DM 100-10 MG/5ML PO SYRP
15.0000 mL | ORAL_SOLUTION | Freq: Two times a day (BID) | ORAL | Status: DC
  Administered 2018-04-28 (×2): 15 mL via ORAL
  Filled 2018-04-28 (×3): qty 15

## 2018-04-28 MED ORDER — QUETIAPINE FUMARATE 25 MG PO TABS
25.0000 mg | ORAL_TABLET | Freq: Every day | ORAL | Status: DC
  Administered 2018-04-28 – 2018-04-30 (×3): 25 mg via JEJUNOSTOMY
  Filled 2018-04-28 (×4): qty 1

## 2018-04-28 MED ORDER — VITAL AF 1.2 CAL PO LIQD
1000.0000 mL | ORAL | Status: DC
  Administered 2018-04-28 (×2): 1000 mL
  Filled 2018-04-28 (×2): qty 1000

## 2018-04-28 MED ORDER — IOHEXOL 300 MG/ML  SOLN
75.0000 mL | Freq: Once | INTRAMUSCULAR | Status: AC | PRN
  Administered 2018-04-28: 75 mL via INTRAVENOUS

## 2018-04-28 NOTE — Care Management Important Message (Signed)
Important Message  Patient Details  Name: Kurt [name] MRN: [id number] Date of Birth: 06-Sep-1940   Medicare Important Message Given:  Yes    Orbie Pyo 04/28/2018, 1:26 PM

## 2018-04-28 NOTE — Progress Notes (Signed)
Modified Barium Swallow Progress Note  Patient Details  Name: Kurt [name] MRN: [id number] Date of Birth: 1940-04-25  Today's Date: 04/28/2018  Modified Barium Swallow completed.  Full report located under Chart Review in the Imaging Section.  Brief recommendations include the following:  Clinical Impression  Pt has a mild oral and severe pharyngeal dysphagia with suspicion for anatomical abnormalities contributing. Abnormal tissue appears to be present around the arytenoids and glottis, with attending MD notified of concern but radiologist not present to interperet. Oral phase is likely impacted by mild weakness, with slow posterior transit. Pharyngeally, pt has reduced epiglottic deflection and airway closure, which may be structural in nature. He has mild residue with thin liquids in his valleculae, increasing to moderate residue as boluses become thicker. Pt did not trigger a pharyngeal swallow with purees, letting them sit in his valleculae. SLP provided Max visual and verbal cueing, including use of video feedback, to elicit a volitional swallow. During pt's swallow phase he does not achieve adequate laryngeal closure, and subsequently aspirates thin and nectar thick liquids. Penetration occurs with purees. Airway compromise is primarily silent, but when he did cough, it was weak and ineffective at clearing the airway. Pt is not safe for a PO diet at this time. Discussed with MD my recommendation for pursuing underlying etiology of dysphagia to facilitate POC moving forward. In the mean time, would continue to use temporary alternative means of nutrition.   Swallow Evaluation Recommendations   Recommended Consults: Consider ENT evaluation   SLP Diet Recommendations: NPO;Alternative means - temporary       Medication Administration: Via alternative means               Oral Care Recommendations: Oral care QID   Other Recommendations: Have oral suction available    Venita Sheffield  Kurt [name] 04/28/2018,4:09 PM   Pollyann Glen, M.A. [place] Acute Environmental education officer [phone] Office [phone]

## 2018-04-28 NOTE — Progress Notes (Signed)
RT Note:  Called to 2West to room 2W15 by Elmyra Ricks, RN, she stated patient sats were dropping and patient was on HFNC at the time.  Patient was put on NRB 15L while we waited for an NT suction order from MD.  After receiving order, RT NT suctioned patient and got up quite a lot of thick, white secretions.  Patient sounded better and sats got better, but patient would not let RT go down again to get more out.  Patient continues to have a weak cough and does not efficiently clear secretions.  Patient remains on NRB at this time and if sats improve, will place patient back on [place] HFNC and continute to monitor.  RT was able to get patient to do the flutter earlier in evening, but was a weak effort.

## 2018-04-28 NOTE — Progress Notes (Signed)
RR 34 and labored.  Changed O2 delivery system to HI [facility].  Pt maintaining sats of 93.  Will cont to monitor

## 2018-04-28 NOTE — Progress Notes (Signed)
RD working remotely  Nutrition Follow Up  DOCUMENTATION CODES:   Severe malnutrition in context of chronic illness  INTERVENTION:    Initiate Vital AF 1.2 at 25 ml/hr and increase by 10 ml every 8 hrs to goal rate of 65 ml/hr  Provides 1872 kcals, 117 gm protein, 1265 ml free water daily   D/C Jevity 1.2  NUTRITION DIAGNOSIS:   Severe Malnutrition related to chronic illness(COPD) as evidenced by severe muscle depletion, severe fat depletion, percent weight loss(14% weight loss within 6 months), ongoing  GOAL:   Patient will meet greater than or equal to 90% of their needs, currently unmet  MONITOR:   TF tolerance, Labs, Diet advancement, I & O's, Skin  ASSESSMENT:   78 yo male with PMH of COPD, cigarette smoker, A fib, HLD, diverticulosis, and alcohol abuse who was admitted with COPD exacerbation, PNA requiring BiPAP. Flu & Covid-19 resulted negative.   RD consulted for TF initiation and management 3/20. Cortrak tube was attempted multiple times without success. IR consulted and placed tube; tip is at the proximal duodenum.  Pt transferred from 55M-MICU to 2W-Progressive Care 3/21.  Spoke with Legrand Como, RN. Pt has been having several episodes of diarrhea. TF (Jevity 1.2) is currently off; previously infusing at goal rate of 70 ml/hr.  Will change Jevity 1.2 to semi-elemental formula to optimize tolerance. Labs & medications reviewed. CBG's [id number].  Pt s/p MBSS today. Revealed severe pharyngeal dysphagia. SLP recommending continued NPO status.  Diet Order:   Diet Order            Diet NPO time specified  Diet effective now             EDUCATION NEEDS:   No education needs have been identified at this time  Skin:  Skin Assessment: Skin Integrity Issues: Skin Integrity Issues:: DTI DTI: sacrum  Last BM:  3/24   Intake/Output Summary (Last 24 hours) at 04/28/2018 1726 Last data filed at 04/27/2018 2028 Gross per 24 hour  Intake -  Output 1000 ml   Net -1000 ml   Height:   Ht Readings from Last 1 Encounters:  04/12/2018 6' (1.829 m)   Weight:  Wt Readings from Last 1 Encounters:  04/27/18 61.8 kg   Ideal Body Weight:  80.9 kg  BMI:  Body mass index is 18.48 kg/m.  Estimated Nutritional Needs:   Kcal:  1900-2100  Protein:  100-115 gm  Fluid:  1.9-2.1 L  Arthur Holms, RD, LDN Pager #: [phone] After-Hours Pager #: [phone]

## 2018-04-28 NOTE — Progress Notes (Signed)
Physical Therapy Treatment Patient Details Name: Kurt [name] MRN: [id number] DOB: 01-18-1941 Today's Date: 04/28/2018    History of Present Illness 78 year old male with history of COPD, atrial fibrillation, tobacco use, BPH presented with complaints of cough, shortness of breath and fevers with T-max of 102F x2 days and was noted to be in A. fib with RVR.  Patient admitted for COPD exacerbation with pneumonia/hypoxic respiratory failure requiring BiPAP therapy.  He also has poor dentition and is on aspiration precautions/coverage.  His flu and CoVid19 testing resulted negative.     PT Comments    Pt tolerated activity better today than yesterday. Transferred into standing and well as to and from [facility] with mod A +2, fatigues very quickly with all activity. VSS remained stable on 10L HFNC. Completed there ex in supine. Continue to recommend CIR.     Follow Up Recommendations  CIR     Equipment Recommendations  Other (comment)(defer)    Recommendations for Other Services       Precautions / Restrictions Precautions Precautions: Fall Precaution Comments: ng tube Restrictions Weight Bearing Restrictions: No    Mobility  Bed Mobility Overal bed mobility: Needs Assistance Bed Mobility: Supine to Sit;Sit to Supine     Supine to sit: HOB elevated;Mod assist Sit to supine: HOB elevated;Mod assist   General bed mobility comments: requires mod assist for trunk support to EOB, mod assist to bring LEs back to supine after activities; increased fatigue after activities today   Transfers Overall transfer level: Needs assistance Equipment used: 1 person hand held assist;Rolling walker (2 wheeled) Transfers: Sit to/from [facility] Sit to Stand: Mod assist;+2 safety/equipment;+2 physical assistance Stand pivot transfers: Mod assist;+2 physical assistance;+2 safety/equipment       General transfer comment: assist to power up, balance and safety, side stepping  towards HOB before sitting down   Ambulation/Gait Ambulation/Gait assistance: Min assist;+2 safety/equipment Gait Distance (Feet): 2 Feet Assistive device: Rolling walker (2 wheeled) Gait Pattern/deviations: Step-to pattern     General Gait Details: pt took steps to [facility], fatigued very quickly   Stairs             Wheelchair Mobility    Modified Rankin (Stroke Patients Only)       Balance Overall balance assessment: Needs assistance Sitting-balance support: Feet supported;No upper extremity supported Sitting balance-Leahy Scale: Poor Sitting balance - Comments: B UE support required, fatigues easily    Standing balance support: During functional activity;Single extremity supported Standing balance-Leahy Scale: Poor Standing balance comment: relaint on B UE and external support                            Cognition Arousal/Alertness: Awake/alert Behavior During Therapy: Flat affect Overall Cognitive Status: No family/caregiver present to determine baseline cognitive functioning Area of Impairment: Following commands;Awareness;Problem solving;Safety/judgement                       Following Commands: Follows one step commands consistently;Follows one step commands with increased time Safety/Judgement: Decreased awareness of safety;Decreased awareness of deficits Awareness: Emergent Problem Solving: Slow processing;Decreased initiation;Difficulty sequencing;Requires verbal cues;Requires tactile cues General Comments: pt angers easily      Exercises General Exercises - Lower Extremity Ankle Circles/Pumps: AROM;Both;10 reps;Supine Hip ABduction/ADduction: AROM;Both;10 reps;Supine Straight Leg Raises: AROM;Both;10 reps;Supine    General Comments General comments (skin integrity, edema, etc.): VSS on 10L HFNC, HR WFL      Pertinent Vitals/Pain Pain Assessment:  No/denies pain    Home Living                      Prior Function             PT Goals (current goals can now be found in the care plan section) Acute Rehab PT Goals Patient Stated Goal: none stated PT Goal Formulation: With patient Time For Goal Achievement: 05/08/18 Potential to Achieve Goals: Fair Progress towards PT goals: Progressing toward goals    Frequency    Min 3X/week      PT Plan Current plan remains appropriate    Co-evaluation PT/OT/SLP Co-Evaluation/Treatment: Yes Reason for Co-Treatment: For patient/therapist safety;To address functional/ADL transfers;Necessary to address cognition/behavior during functional activity;Complexity of the patient's impairments (multi-system involvement) PT goals addressed during session: Mobility/safety with mobility;Balance;Proper use of DME;Strengthening/ROM OT goals addressed during session: ADL's and self-care      AM-PAC PT "6 Clicks" Mobility   Outcome Measure  Help needed turning from your back to your side while in a flat bed without using bedrails?: A Little Help needed moving from lying on your back to sitting on the side of a flat bed without using bedrails?: A Little Help needed moving to and from a bed to a chair (including a wheelchair)?: A Lot Help needed standing up from a chair using your arms (e.g., wheelchair or bedside chair)?: A Lot Help needed to walk in hospital room?: A Lot Help needed climbing 3-5 steps with a railing? : Total 6 Click Score: 13    End of Session Equipment Utilized During Treatment: Oxygen Activity Tolerance: Patient limited by fatigue Patient left: in bed;with call bell/phone within reach Nurse Communication: Mobility status PT Visit Diagnosis: Unsteadiness on feet (R26.81);Other (comment)(dyspnea)     Time: 7673-4193 PT Time Calculation (min) (ACUTE ONLY): 26 min  Charges:  $Neuromuscular Re-education: 8-22 mins                     Leighton Roach, PT  Acute Rehab Services  Pager [phone] Office [place] 04/28/2018, 5:20 PM

## 2018-04-28 NOTE — Progress Notes (Signed)
Occupational Therapy Treatment Patient Details Name: Kurt [name] MRN: [id number] DOB: 31-Oct-1940 Today's Date: 04/28/2018    History of present illness 78 year old male with history of COPD, atrial fibrillation, tobacco use, BPH presented with complaints of cough, shortness of breath and fevers with T-max of 102F x2 days and was noted to be in A. fib with RVR.  Patient admitted for COPD exacerbation with pneumonia/hypoxic respiratory failure requiring BiPAP therapy.  He also has poor dentition and is on aspiration precautions/coverage.  His flu and CoVid19 testing resulted negative.    OT comments  Pt progressing slowly.  Increased alertness and participation today.  Assisted with hygiene after incontinent BM in bed, then reports need to use bathroom again and able to engage in transfer to [facility]. Able to complete toilet transfer with mod assist +2, poor standing tolerance during total assist hygiene and reliant on UE and external support. On 10L HFNC during session with oxygen saturations maintained, and HR WFL.   Continues to fatigue quickly.  Continue to recommend CIR.  Will follow.    Follow Up Recommendations  CIR    Equipment Recommendations  3 in 1 bedside commode    Recommendations for Other Services Rehab consult    Precautions / Restrictions Precautions Precautions: Fall Precaution Comments: ng tube Restrictions Weight Bearing Restrictions: No       Mobility Bed Mobility Overal bed mobility: Needs Assistance Bed Mobility: Supine to Sit;Sit to Supine     Supine to sit: Min guard;HOB elevated Sit to supine: Min assist;HOB elevated   General bed mobility comments: Able to get to EOB with increased time, assist to bring RLE into bed. Assist to scoot bottom along bed for better positioning.   Transfers Overall transfer level: Needs assistance Equipment used: 1 person hand held assist;Rolling walker (2 wheeled) Transfers: Sit to/from Stand Sit to Stand: Mod  assist Stand pivot transfers: Mod assist;+2 physical assistance;+2 safety/equipment       General transfer comment: deferred due to fatigue     Balance Overall balance assessment: Needs assistance Sitting-balance support: Feet supported;No upper extremity supported Sitting balance-Leahy Scale: Poor Sitting balance - Comments: Able to sit EOB ~seconds at a time then with posterior lean and needing support; fatigues quickly.    Standing balance support: During functional activity;Single extremity supported Standing balance-Leahy Scale: Poor Standing balance comment: relaint on external support                           ADL either performed or assessed with clinical judgement   ADL Overall ADL's : Needs assistance/impaired     Grooming: Wash/dry hands;Wash/dry face;Set up;Sitting                   Toilet Transfer: Moderate assistance;+2 for safety/equipment;+2 for physical assistance;BSC   Toileting- Clothing Manipulation and Hygiene: Total assistance;+2 for physical assistance;+2 for safety/equipment;Sit to/from stand       Functional mobility during ADLs: Moderate assistance;Rolling walker;+2 for physical assistance;+2 for safety/equipment General ADL Comments: limited by generalized weakness and decreased activity tolerance      Vision       Perception     Praxis      Cognition Arousal/Alertness: Awake/alert Behavior During Therapy: Flat affect Overall Cognitive Status: No family/caregiver present to determine baseline cognitive functioning Area of Impairment: Following commands;Awareness;Problem solving;Safety/judgement                       Following Commands:  Follows one step commands consistently;Follows one step commands with increased time Safety/Judgement: Decreased awareness of safety;Decreased awareness of deficits Awareness: Intellectual Problem Solving: Slow processing;Decreased initiation;Difficulty sequencing;Requires  verbal cues;Requires tactile cues General Comments: continues to decline answering orientation questions, knows [facility] is [facility].  difficult to understand with low, raspy voice        Exercises     Shoulder Instructions       General Comments VSS, on 10L HFNC; HR WFL      Pertinent Vitals/ Pain       Pain Assessment: No/denies pain Faces Pain Scale: No hurt  Home Living                                          Prior Functioning/Environment              Frequency  Min 2X/week        Progress Toward Goals  OT Goals(current goals can now be found in the care plan section)  Progress towards OT goals: Progressing toward goals  Acute Rehab OT Goals Patient Stated Goal: none stated OT Goal Formulation: With patient Time For Goal Achievement: 05/10/18 Potential to Achieve Goals: Good  Plan Discharge plan remains appropriate;Frequency remains appropriate    Co-evaluation    PT/OT/SLP Co-Evaluation/Treatment: Yes Reason for Co-Treatment: Complexity of the patient's impairments (multi-system involvement);For patient/therapist safety;To address functional/ADL transfers   OT goals addressed during session: ADL's and self-care      AM-PAC OT "6 Clicks" Daily Activity     Outcome Measure   Help from another person eating meals?: Total Help from another person taking care of personal grooming?: A Little Help from another person toileting, which includes using toliet, bedpan, or urinal?: Total Help from another person bathing (including washing, rinsing, drying)?: A Lot Help from another person to put on and taking off regular upper body clothing?: A Lot Help from another person to put on and taking off regular lower body clothing?: A Lot 6 Click Score: 11    End of Session Equipment Utilized During Treatment: Oxygen  OT Visit Diagnosis: Other abnormalities of gait and mobility (R26.89);Muscle weakness (generalized) (M62.81);Other symptoms and signs  involving cognitive function   Activity Tolerance Patient tolerated treatment well   Patient Left in bed;with call bell/phone within reach;with bed alarm set;with nursing/sitter in room   Nurse Communication Mobility status        Time: 4944-9675 OT Time Calculation (min): 24 min  Charges: OT General Charges $OT Visit: 1 Visit OT Treatments $Self Care/Home Management : 8-22 mins  Delight Stare, [place] Pager [phone] Office [phone]      Delight Stare 04/28/2018, 4:36 PM

## 2018-04-28 NOTE — Progress Notes (Deleted)
Pt RR 34 and labored.  Changed Changed O2 delivery system to [facility].  Maintaining sats in the 90's.  Will continue to monitor.

## 2018-04-28 NOTE — Progress Notes (Signed)
PROGRESS NOTE  Kurt [name] FWY:[id number] DOB: 1940/12/27 DOA: 04/17/2018 PCP: [name], NP  HPI/Recap of past 65 hours: 78 year old male with history of COPD, atrial fibrillation, tobacco use, BPH presented with complaints of cough, shortness of breath and fevers with T-max of 102F x2 days and was noted to be in A. fib with RVR.  Patient admitted for COPD exacerbation with pneumonia/hypoxic respiratory failure requiring BiPAP therapy.  He also has poor dentition and is on aspiration precautions/coverage.  His flu and CoVid19 testing resulted negative.   Hospital course complicated by confusion and agitation for which she was started on Seroquel 25 mg twice daily.  04/27/18: Patient seen and examined at bedside.  Increase O2 requirement overnight, now on high flow nasal cannula to maintain saturation greater than 90%.  Also reported abdominal pain with nausea.  TF turned off.  This morning he is somnolent but easily arousable to voices.  Stat chest x-ray showed left lower lobe infiltrates and mild increase in pulmonary vascularity.  Started on low-dose IV Lasix 20 mg twice daily. Abdominal x-ray unremarkable for any acute intra-abdominal findings.   04/28/18: Patient seen and examined his bedside.  He is more alert and interactive today.  Audible rhonchorous sounds noted.  Started pulmonary toilet.  He is on high flow nasal cannula and tolerated well.  Answers questions appropriately.   Assessment/Plan: Principal Problem:   Acute respiratory failure with hypoxia (HCC) Active Problems:   Atrial fibrillation with RVR (HCC)   COPD exacerbation (HCC)   Community acquired pneumonia   Pressure injury of skin   Protein-calorie malnutrition, severe  Acute hypoxic respiratory failure secondary to bilateral community-acquired pneumonia versus aspiration Viral etiology ruled out On Unasyn for aspiration pneumonia, continue to complete 10 days No leukocytosis Lactic acid 1.0 and  procalcitonin less than 0.10 on 04/27/2018 Increased oxygen demand overnight requiring high flow nasal cannula Continue tube feeding and maintain head of bed above 30 degrees to avoid aspiration Independently reviewed chest x-ray done on 04/27/2018 which revealed left lower lobe infiltrates and mild increase in pulmonary vascularity Continue low-dose IV Lasix 20 mg twice daily Maintain O2 saturation greater than 92% Continuous pulse oximetry Start pulmonary toilet with Mucinex twice daily and flutter valve every 4 hours when awake  Mild pulmonary edema suspect secondary to pneumonia Continue IV Lasix 20 mg twice daily Closely monitor vital signs to avoid hypotension  Abdominal pain/nausea, unclear etiology Abdominal x-ray done with no acute intra-abdominal findings IV Zofran PRN for nausea Keep TF Turned off for now Continue to monitor  Suspected aspiration pneumonia Continue Unasyn Continue n.p.o. Has coretract in place for feeding Maintain head of bed above 30 degree angle Aspiration precautions Started pulmonary toilet with Mucinex GI tube feeding twice daily and flutter valve every 4 hours while awake  Resolving acute metabolic encephalopathy, possible hospital delirium Continue IV antibiotics for aspiration pneumonia Continue Seroquel 25 mg daily  Chronic A. fib with occasional RVR Continue diltiazem for rate control Continue Eliquis for CVA prevention  BPH Continue tamsulosin Continue to monitor urine output Has condom catheter in place  Dysphagia Speech therapist evaluated recommended n.p.o. Aspiration precautions Maintain head of bed elevated greater than 30 degree angle  Physical debility/ambulatory dysfunction PT assessed and recommended inpatient rehab Inpatient rehab consult placed Fall precautions  Severe protein calorie malnutrition Albumin 2.0 and BMI 18 Oral supplement through core track    DVT prophylaxis: On apixaban Code Status: DNR per record  Family / Patient Communication:  Updated daughter  on the phone on 04/27/2018. Disposition Plan:  Possible CIR   Objective: Vitals:   04/27/18 2309 04/28/18 0307 04/28/18 0637 04/28/18 0739  BP: (!) 90/58 97/63  108/60  Pulse: 90 89  89  Resp: (!) 27 (!) 24  (!) 31  Temp: 97.8 F (36.6 C) 98 F (36.7 C)  98.8 F (37.1 C)  TempSrc: Oral Oral    SpO2: 90% 100% 94% 96%  Weight:      Height:        Intake/Output Summary (Last 24 hours) at 04/28/2018 0926 Last data filed at 04/27/2018 2028 Gross per 24 hour  Intake 130 ml  Output 3200 ml  Net -3070 ml   Filed Weights   04/25/18 0500 04/26/18 0544 04/27/18 0437  Weight: 62.9 kg 63.2 kg 61.8 kg    Exam:  . General: 78 y.o. year-old male frail appearing in no acute distress.  Audible rhonchorous sounds.  Alert and interactive. Cortrack in place. . Cardiovascular: Irregular rate and rhythm with no rubs or gallops.  No JVD or thyromegaly. Marland Kitchen Respiratory: Audible rhonchorous sounds noted with poor inspiratory effort. . Abdomen: Soft with normal bowel sounds x4 quadrants . Psychiatry: Mood is appropriate for condition and setting   Data Reviewed: CBC: Recent Labs  Lab 04/22/18 0516 04/24/18 1141 04/26/18 0344 04/27/18 0644 04/28/18 0620  WBC 7.8 6.2 6.5 7.2 10.0  NEUTROABS  --   --  4.3 5.3  --   HGB 9.8* 12.0* 11.6* 10.9* 11.2*  HCT 31.0* 35.6* 35.6* 33.3* 33.9*  MCV 97.2 94.2 94.9 96.8 96.3  PLT 279 337 322 351 892   Basic Metabolic Panel: Recent Labs  Lab 04/23/18 0258 04/24/18 1141 04/24/18 1142 04/24/18 1833 04/25/18 0444 04/25/18 1834 04/26/18 0344 04/28/18 0620  NA 139 140  --   --  141  --  140 142  K 3.8 3.4*  --   --  3.5  --  4.4 4.1  CL 108 104  --   --  106  --  108 97*  CO2 23 26  --   --  27  --  23 32  GLUCOSE 72 102*  --   --  126*  --  100* 148*  BUN 5* <5*  --   --  6*  --  8 9  CREATININE 0.69 0.68  --   --  0.76  --  0.71 0.90  CALCIUM 8.6* 8.8*  --   --  8.9  --  8.8* 9.5  MG 1.8  --   1.8 1.9 1.9 2.0 2.1  --   PHOS 2.0*  --  2.5 2.2* 2.2* 1.8* 2.2*  --    GFR: Estimated Creatinine Clearance: 58.2 mL/min (by C-G formula based on SCr of 0.9 mg/dL). Liver Function Tests: Recent Labs  Lab 04/23/18 0258 04/26/18 0344  ALBUMIN 2.0* 2.0*   No results for input(s): LIPASE, AMYLASE in the last 168 hours. No results for input(s): AMMONIA in the last 168 hours. Coagulation Profile: No results for input(s): INR, PROTIME in the last 168 hours. Cardiac Enzymes: No results for input(s): CKTOTAL, CKMB, CKMBINDEX, TROPONINI in the last 168 hours. BNP (last 3 results) No results for input(s): PROBNP in the last 8760 hours. HbA1C: No results for input(s): HGBA1C in the last 72 hours. CBG: Recent Labs  Lab 04/27/18 1144 04/27/18 1532 04/27/18 2025 04/27/18 2315 04/28/18 0303  GLUCAP 137* 137* 94 122* 125*   Lipid Profile: No results for input(s): CHOL, HDL, LDLCALC,  TRIG, CHOLHDL, LDLDIRECT in the last 72 hours. Thyroid Function Tests: No results for input(s): TSH, T4TOTAL, FREET4, T3FREE, THYROIDAB in the last 72 hours. Anemia Panel: No results for input(s): VITAMINB12, FOLATE, FERRITIN, TIBC, IRON, RETICCTPCT in the last 72 hours. Urine analysis:    Component Value Date/Time   COLORURINE STRAW (A) 01/15/2016 1316   APPEARANCEUR CLEAR 01/15/2016 1316   LABSPEC 1.002 (L) 01/15/2016 1316   PHURINE 6.0 01/15/2016 1316   GLUCOSEU NEGATIVE 01/15/2016 1316   GLUCOSEU NEGATIVE 03/29/2010 1611   HGBUR NEGATIVE 01/15/2016 1316   BILIRUBINUR NEGATIVE 01/15/2016 1316   KETONESUR NEGATIVE 01/15/2016 1316   PROTEINUR NEGATIVE 01/15/2016 1316   UROBILINOGEN 0.2 03/29/2010 1611   NITRITE NEGATIVE 01/15/2016 1316   LEUKOCYTESUR NEGATIVE 01/15/2016 1316   Sepsis Labs: @LABRCNTIP (procalcitonin:4,lacticidven:4)  ) Recent Results (from the past 240 hour(s))  Blood culture (routine x 2)     Status: None   Collection Time: 04/13/2018  7:15 PM  Result Value Ref Range Status    Specimen Description BLOOD RIGHT ANTECUBITAL  Final   Special Requests   Final    BOTTLES DRAWN AEROBIC AND ANAEROBIC Blood Culture results may not be optimal due to an inadequate volume of blood received in culture bottles   Culture   Final    NO GROWTH 5 DAYS Performed at [facility], [place] [address] [place], [place] [address]    Report Status 04/24/2018 FINAL  Final  Blood culture (routine x 2)     Status: None   Collection Time: 05/01/2018  7:35 PM  Result Value Ref Range Status   Specimen Description BLOOD BLOOD RIGHT FOREARM  Final   Special Requests   Final    BOTTLES DRAWN AEROBIC AND ANAEROBIC Blood Culture results may not be optimal due to an inadequate volume of blood received in culture bottles   Culture   Final    NO GROWTH 5 DAYS Performed at [facility], [place] [address], [place],  [address]    Report Status 04/24/2018 FINAL  Final  Respiratory Panel by PCR     Status: None   Collection Time: 04/10/2018  7:58 PM  Result Value Ref Range Status   Adenovirus NOT DETECTED NOT DETECTED Final   Coronavirus 229E NOT DETECTED NOT DETECTED Final    Comment: (NOTE) The Coronavirus on the Respiratory Panel, DOES NOT test for the novel  Coronavirus (2019 nCoV)    Coronavirus HKU1 NOT DETECTED NOT DETECTED Final   Coronavirus NL63 NOT DETECTED NOT DETECTED Final   Coronavirus OC43 NOT DETECTED NOT DETECTED Final   Metapneumovirus NOT DETECTED NOT DETECTED Final   Rhinovirus / Enterovirus NOT DETECTED NOT DETECTED Final   Influenza A NOT DETECTED NOT DETECTED Final   Influenza B NOT DETECTED NOT DETECTED Final   Parainfluenza Virus 1 NOT DETECTED NOT DETECTED Final   Parainfluenza Virus 2 NOT DETECTED NOT DETECTED Final   Parainfluenza Virus 3 NOT DETECTED NOT DETECTED Final   Parainfluenza Virus 4 NOT DETECTED NOT DETECTED Final   Respiratory Syncytial Virus NOT DETECTED NOT DETECTED Final   Bordetella pertussis NOT DETECTED NOT DETECTED Final   Chlamydophila  pneumoniae NOT DETECTED NOT DETECTED Final   Mycoplasma pneumoniae NOT DETECTED NOT DETECTED Final    Comment: Performed at [facility], [address] [place],  [address]  Novel Coronavirus, NAA (hospital order; send-out to ref lab)     Status: None   Collection Time: 05/04/2018  7:58 PM  Result  Value Ref Range Status   SARS-CoV-2, NAA Not Detected Not Detected Final    Comment: (NOTE) Testing was performed using the cobas(R) SARS-CoV-2 test. This test was developed and its performance characteristics determined by [employer]. This test has not been FDA cleared or approved. This test has been authorized by FDA under an Emergency Use Authorization (EUA). This test is only authorized for the duration of time the declaration that circumstances exist justifying the authorization of the emergency use of in vitro diagnostic tests for detection of SARS-CoV-2 virus and/or diagnosis of COVID-19 infection under section 564(b)(1) of the Act, 21 U.S.C. 270WCB-7(S)(2), unless the authorization is terminated or revoked sooner. Performed At: [facility] [address] [place], [place] [id number] Rush Farmer MD [id number]    Coronavirus Source NASAL SWAB  Final    Comment: Performed at [facility], [place] [address] [place], [place] [address]  Urine culture     Status: None   Collection Time: 04/20/18  3:55 AM  Result Value Ref Range Status   Specimen Description URINE, RANDOM  Final   Special Requests NONE  Final   Culture   Final    NO GROWTH Performed at [facility], [place] [address] [place], [place] [address]    Report Status 04/21/2018 FINAL  Final  Expectorated sputum assessment w rflx to resp cult     Status: None   Collection Time: 04/20/18  4:00 AM  Result Value Ref Range Status   Specimen Description SPUTUM  Final   Special Requests NONE  Final   Sputum evaluation   Final    Sputum specimen not acceptable for testing.  Please  recollect.   Gram Stain Report Called to,Read Back By and Verified With: RN K WHITE @ [phone] 04/20/18 BY Tobey Bride Performed at [facility], [place] [address] [place], [place] [address]    Report Status 04/20/2018 FINAL  Final  MRSA PCR Screening     Status: None   Collection Time: 04/20/18  4:05 AM  Result Value Ref Range Status   MRSA by PCR NEGATIVE NEGATIVE Final    Comment:        The GeneXpert MRSA Assay (FDA approved for NASAL specimens only), is one component of a comprehensive MRSA colonization surveillance program. It is not intended to diagnose MRSA infection nor to guide or monitor treatment for MRSA infections. Performed at [facility], [place] [address], [place], [place] [address]   Expectorated sputum assessment w rflx to resp cult     Status: None   Collection Time: 04/20/18 11:04 AM  Result Value Ref Range Status   Specimen Description SPUTUM  Final   Special Requests NONE  Final   Sputum evaluation   Final    THIS SPECIMEN IS ACCEPTABLE FOR SPUTUM CULTURE Performed at [facility], [place] [address] [place], [place] [address]    Report Status 04/20/2018 FINAL  Final  Culture, respiratory     Status: None   Collection Time: 04/20/18 11:04 AM  Result Value Ref Range Status   Specimen Description SPUTUM  Final   Special Requests NONE Reflexed from [id number]  Final   Gram Stain   Final    FEW WBC PRESENT, PREDOMINANTLY PMN FEW GRAM POSITIVE COCCI RARE YEAST    Culture   Final    FEW Consistent with normal respiratory flora. Performed at [facility], [place] [address] [place], [place] [address]    Report  Status 04/22/2018 FINAL  Final      Studies: No results found.  Scheduled Meds: . apixaban  5 mg Per Tube BID  . chlorhexidine  15 mL Mouth Rinse BID  . Chlorhexidine Gluconate Cloth  6 each Topical Q0600  . diltiazem  30 mg Per Tube Q8H  . furosemide  20 mg Intravenous BID  . guaiFENesin-dextromethorphan  15 mL Oral BID  .  ipratropium-albuterol  3 mL Nebulization BID  . mouth rinse  15 mL Mouth Rinse q12n4p  . mirabegron ER  50 mg Oral Daily  . mometasone-formoterol  2 puff Inhalation BID  . nicotine  14 mg Transdermal Daily  . pantoprazole sodium  40 mg Per Tube Daily  . QUEtiapine  25 mg Per J Tube QHS  . tamsulosin  0.4 mg Oral QPC supper    Continuous Infusions: . sodium chloride Stopped (04/25/18 0453)  . ampicillin-sulbactam (UNASYN) IV 3 g (04/28/18 0641)  . feeding supplement (JEVITY 1.2 CAL) 70 mL/hr at 04/27/18 1200     LOS: 9 days     Kayleen Memos, MD [facility] Pager [phone]  If 7PM-7AM, please contact night-coverage [email or web address] Password [email or web address] 04/28/2018, 9:26 AM

## 2018-04-28 NOTE — Progress Notes (Signed)
RT deep oral sxn pt - mod thick white frothy.

## 2018-04-28 NOTE — Progress Notes (Signed)
RT Note:  Placed patient on heated HFNC.  Currently patient is at 70% FIO2 at 30L.  Sats are maintaining around 92 to 93%.  Rate has come down to 30s instead of 40s at this time it is at a rate of 38.  RN and RT will continue to monitor patient.

## 2018-04-29 DIAGNOSIS — C329 Malignant neoplasm of larynx, unspecified: Secondary | ICD-10-CM

## 2018-04-29 DIAGNOSIS — J387 Other diseases of larynx: Secondary | ICD-10-CM

## 2018-04-29 DIAGNOSIS — Z7189 Other specified counseling: Secondary | ICD-10-CM

## 2018-04-29 DIAGNOSIS — Z515 Encounter for palliative care: Secondary | ICD-10-CM

## 2018-04-29 DIAGNOSIS — F17210 Nicotine dependence, cigarettes, uncomplicated: Secondary | ICD-10-CM

## 2018-04-29 DIAGNOSIS — J9601 Acute respiratory failure with hypoxia: Secondary | ICD-10-CM

## 2018-04-29 DIAGNOSIS — E43 Unspecified severe protein-calorie malnutrition: Secondary | ICD-10-CM

## 2018-04-29 LAB — GLUCOSE, CAPILLARY
GLUCOSE-CAPILLARY: 143 mg/dL — AB (ref 70–99)
Glucose-Capillary: 100 mg/dL — ABNORMAL HIGH (ref 70–99)
Glucose-Capillary: 110 mg/dL — ABNORMAL HIGH (ref 70–99)
Glucose-Capillary: 120 mg/dL — ABNORMAL HIGH (ref 70–99)
Glucose-Capillary: 123 mg/dL — ABNORMAL HIGH (ref 70–99)
Glucose-Capillary: 146 mg/dL — ABNORMAL HIGH (ref 70–99)
Glucose-Capillary: 90 mg/dL (ref 70–99)

## 2018-04-29 MED ORDER — LORAZEPAM 2 MG/ML IJ SOLN: 0.5000 mg | Freq: Every evening | INTRAMUSCULAR | Status: DC | PRN

## 2018-04-29 MED ORDER — LORAZEPAM 2 MG/ML IJ SOLN: 0.2500 mg | Freq: Four times a day (QID) | INTRAMUSCULAR | Status: DC | PRN

## 2018-04-29 MED ORDER — MORPHINE SULFATE (PF) 2 MG/ML IV SOLN: 1.0000 mg | INTRAVENOUS | Status: DC | PRN

## 2018-04-29 MED ORDER — SODIUM CHLORIDE 0.9 % IV SOLN
INTRAVENOUS | Status: DC
  Administered 2018-04-29 – 2018-05-01 (×3): via INTRAVENOUS

## 2018-04-29 MED ORDER — GUAIFENESIN-DM 100-10 MG/5ML PO SYRP
15.0000 mL | ORAL_SOLUTION | Freq: Two times a day (BID) | ORAL | Status: DC
  Administered 2018-04-29 – 2018-05-01 (×5): 15 mL
  Filled 2018-04-29 (×5): qty 15

## 2018-04-29 MED ORDER — FUROSEMIDE 10 MG/ML IJ SOLN
40.0000 mg | Freq: Once | INTRAMUSCULAR | Status: AC
  Administered 2018-04-29: 40 mg via INTRAVENOUS
  Filled 2018-04-29: qty 4

## 2018-04-29 MED ORDER — SODIUM CHLORIDE 3 % IN NEBU
4.0000 mL | INHALATION_SOLUTION | Freq: Three times a day (TID) | RESPIRATORY_TRACT | Status: AC
  Administered 2018-04-29 – 2018-05-01 (×6): 4 mL via RESPIRATORY_TRACT
  Filled 2018-04-29 (×6): qty 4

## 2018-04-29 NOTE — Consult Note (Signed)
Consultation Note Date: 04/29/2018   Patient Name: Kurt [name]  DOB: [birth year]  MRN: [id number]  Age / Sex: 78 y.o., male  PCP: [name], NP Referring Physician: Kayleen Memos, DO  Reason for Consultation: Establishing goals of care and Psychosocial/spiritual support  HPI/Patient Profile: 78 y.o. male  with past medical history of COPD, Afib, tobacco abuse, hx of ETOH, DVT who was admitted on 04/15/2018 with SOB, fever, and afib with RVR.  He was ruled out for influenza and coronavirus.  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  He has been evaluated by ENT.  An Oncology consultation is pending.  Clinical Assessment and Goals of Care:  I have reviewed medical records including EPIC notes, labs and imaging, received report from the care team, Dr. Janace Hoard and Dr. [place] Crane, assessed the patient and then spoke on speaker phone with the patient and his daughter Kurt [name]  to discuss diagnosis prognosis, [place], disposition and options.  The patient is very difficult to understand as he has to speak in a whisper.  I introduced Palliative Medicine as specialized medical care for people living with serious illness. It focuses on providing relief from the symptoms and stress of a serious illness. The goal is to improve quality of life for both the patient and the family.  As far as functional and nutritional status, prior to admission the patient was living independently in his own home and taking care of his two [place] walking horses.  Over the last 3 weeks he became very short of breath and progressively weaker until he was brought to the hospital.  We discussed her current illness and what it means in the larger context of her on-going co-morbidities.  Dr. Janace Hoard offered options to the family:  (1) laryngectomy, (2)  tracheostomy, (3) chemotherapy/radiation, (4) comfort care.  The patient gave the impression that he did not want to treat the tumor but simply wanted the tracheostomy.  Kurt [name] felt that her father did not understand what a tracheostomy was and that he would not want it if he did.   The patient's daughters want him to have oncological treatment.    I explained to Kurt [name] that in order for any treatment to be done a biopsy would be necessary and that if a biopsy was done her father would have to have a trach placed.  I attempted to elicit values and goals of care important to the patient.  Kurt [name] seemed overwhelmed on the phone and wanted to talk with her 39 and sister.   She requested consultation from oncology and radiation oncology. She expressed that she was very hesitant to start down this road (trach / biopsy) if they really were not going to have good results in the end.  We agreed to talk again in the morning.   Primary Decision Maker:  PATIENT with the assistance of his two daughters.    SUMMARY OF RECOMMENDATIONS    Patient  with a high level of anxiety - will add low dose ativan PRN Shortness of breath - minimal dose of morphine Oncology consultation requested by Dr. Nevada Crane.   Please consider Radiation Onc consult as well. PMT will follow closely with you.  Code Status/Advance Care Planning:  DNR   Symptom Management:   As above  Palliative Prophylaxis:   Aspiration  Psycho-social/Spiritual:   Desire for further Chaplaincy support: not at this time thanks.  He is a Engineer, manufacturing.  Prognosis:  More information to be gathered from [place].    Discharge Planning: To Be Determined      Primary Diagnoses: Present on Admission: . Acute respiratory failure with hypoxia ([place])   I have reviewed the medical record, interviewed the patient and family, and examined the patient. The following aspects are pertinent.  Past Medical History:  Diagnosis Date  . Abnormal  chest x-ray   . Alcohol abuse   . Anxiety   . Atrial fibrillation ([place])   . Chronic insomnia   . Cigarette smoker   . Colonic polyp   . COPD (chronic obstructive pulmonary disease) ([place])   . Diverticulosis of colon   . DVT (deep venous thrombosis) ([place])   . Fatigue   . GERD (gastroesophageal reflux disease)   . History of pneumonia   . Hypercholesterolemia   . Lumbosacral spondylosis without myelopathy   . Neck pain   . Urinary frequency    Social History   Socioeconomic History  . Marital status: Married    Spouse name: Not on file  . Number of children: 2  . Years of education: Not on file  . Highest education level: Not on file  Occupational History  . Occupation: Reitred from [employer]: [place]  . Financial resource strain: Not on file  . Food insecurity:    Worry: Not on file    Inability: Not on file  . Transportation needs:    Medical: Not on file    Non-medical: Not on file  Tobacco Use  . Smoking status: Current Every Day Smoker    Packs/day: 1.50    Years: 54.00    Pack years: 81.00    Types: Cigarettes  . Smokeless tobacco: Former Systems developer    Types: Chew  . Tobacco comment:  and still smokes sometimes but not everyday  Substance and Sexual Activity  . Alcohol use: Yes    Alcohol/week: 2.0 standard drinks    Types: 2 Cans of beer per week    Comment: occasional  . Drug use: No  . Sexual activity: Not Currently  Lifestyle  . Physical activity:    Days per week: Not on file    Minutes per session: Not on file  . Stress: Not on file  Relationships  . Social connections:    Talks on phone: Not on file    Gets together: Not on file    Attends religious service: Not on file    Active member of club or organization: Not on file    Attends meetings of clubs or organizations: Not on file    Relationship status: Not on file  Other Topics Concern  . Not on file  Social History Narrative   Lives with wife.     Family History   Problem Relation Age of Onset  . Prostate cancer Father   . Cancer Father        prostate  . Hypertension Sister   . Hypertension  Mother    Scheduled Meds: . apixaban  5 mg Per Tube BID  . chlorhexidine  15 mL Mouth Rinse BID  . Chlorhexidine Gluconate Cloth  6 each Topical Q0600  . diltiazem  30 mg Per Tube Q8H  . furosemide  20 mg Intravenous BID  . guaiFENesin-dextromethorphan  15 mL Per Tube BID  . ipratropium-albuterol  3 mL Nebulization BID  . mouth rinse  15 mL Mouth Rinse q12n4p  . mirabegron ER  50 mg Oral Daily  . mometasone-formoterol  2 puff Inhalation BID  . nicotine  14 mg Transdermal Daily  . pantoprazole sodium  40 mg Per Tube Daily  . QUEtiapine  25 mg Per J Tube QHS  . sodium chloride HYPERTONIC  4 mL Nebulization TID  . tamsulosin  0.4 mg Oral QPC supper   Continuous Infusions: . sodium chloride Stopped (04/25/18 0453)  . ampicillin-sulbactam (UNASYN) IV 3 g (04/29/18 1013)   PRN Meds:.sodium chloride, acetaminophen, ipratropium-albuterol, metoprolol tartrate, ondansetron **OR** ondansetron (ZOFRAN) IV No Known Allergies Review of Systems patient denies pain and complains of weakness.  He is SOB and anxious.  Physical Exam  Thin, frail man in bed on high flow oxygen.  Appears anxious.  Poor dentition.  Can not speak above a whisper.  Vital Signs: BP 119/70   Pulse (!) 122   Temp 97.8 F (36.6 C)   Resp (!) 25   Ht 6' (1.829 m)   Wt 49.5 kg   SpO2 93%   BMI 14.80 kg/m  Pain Scale: 0-10 POSS *See Group Information*: 1-Acceptable,Awake and alert Pain Score: 0-No pain   SpO2: SpO2: 93 % O2 Device:SpO2: 93 % O2 Flow Rate: .O2 Flow Rate (L/min): 25 L/min  IO: Intake/output summary:   Intake/Output Summary (Last 24 hours) at 04/29/2018 1710 Last data filed at 04/29/2018 1625 Gross per 24 hour  Intake 870 ml  Output 2700 ml  Net -1830 ml    LBM: Last BM Date: 04/28/18 Baseline Weight: Weight: 78 kg Most recent weight: Weight: 49.5 kg      Palliative Assessment/Data: 40%     Time In: 4:15 Time Out: 5:45 Time Total: 90 min. Greater than 50%  of this time was spent counseling and coordinating care related to the above assessment and plan.  Signed by: Florentina Jenny, PA-C Palliative Medicine Pager: [phone]  Please contact Palliative Medicine Team phone at [phone] for questions and concerns.  For individual provider: See Shea Evans

## 2018-04-29 NOTE — Progress Notes (Signed)
Patient oxygen saturations dropped to low 80s around 2155, respiratory called. Patient placed on nonrebreather at 2200 since highest oxygen saturation on 15L HFNC was 85%. NP Schorr paged and aware of patients saturations as well as patient respiratory rate. RN obtained order to nasaltracheal suction patient, Respiratory tech at bedside to suction with minimal improvement. Patient refused for RT to suction again. Patient placed on heated high flow nasal cannula at 2348, as both respiratory tech and NP Schorr thought it was in the patient's best interest. 40mg  IV lasix given. Patient's oxygen saturations remained stable throughout the night at 92-93%. Patient remained tachypneic, however not symptomatic. Patient HR increased to 120s-130s sustained this morning, 5mg  IV metoprolol given. Patient is now resting comfortably, vital signs stable. Will continue to monitor patient.

## 2018-04-29 NOTE — Progress Notes (Signed)
SLP Cancellation Note  Patient Details Name: Kurt [name] MRN: [id number] DOB: Sep 07, 1940   Cancelled treatment:       Reason Eval/Treat Not Completed: Other (comment) SLP continues to follow along - note that per chart, pt and daughter are trying to decide their [place] moving forward. Will f/u as appropriate pending decisions about treatment plan. At this point, pt remains a high aspiration risk and should remain NPO pending these decisions.    Venita Sheffield Braxtyn Bojarski 04/29/2018, 1:20 PM  Pollyann Glen, M.A. [place] Acute Environmental education officer [phone] Office [phone]

## 2018-04-29 NOTE — Consult Note (Signed)
Reason for Consult:neck mass Referring Physician: hospitalist  TEIGEN BELLIN is an 78 y.o. male.  HPI: hx has hx of COPD and other medical issues. He was admittted for COPD and pneumonia on 3/15. He was hypoxic and on BiPAP. He was also febrile. He worsened and still did not need intubation. He now has had a ct scan that shows a large laryngeal mass and neck mass with destruction of the thyroid cartilage. He has very slight inspiratory noise.   Past Medical History:  Diagnosis Date  . Abnormal chest x-ray   . Alcohol abuse   . Anxiety   . Atrial fibrillation ([place])   . Chronic insomnia   . Cigarette smoker   . Colonic polyp   . COPD (chronic obstructive pulmonary disease) ([place])   . Diverticulosis of colon   . DVT (deep venous thrombosis) ([place])   . Fatigue   . GERD (gastroesophageal reflux disease)   . History of pneumonia   . Hypercholesterolemia   . Lumbosacral spondylosis without myelopathy   . Neck pain   . Urinary frequency     Past Surgical History:  Procedure Laterality Date  . 3 seperate lumbar laminectomies    . right CTS release     Dr. Fredna Dow 12/00    Family History  Problem Relation Age of Onset  . Prostate cancer Father   . Cancer Father        prostate  . Hypertension Sister   . Hypertension Mother     Social History:  reports that he has been smoking cigarettes. He has a 81.00 pack-year smoking history. He has quit using smokeless tobacco.  His smokeless tobacco use included chew. He reports current alcohol use of about 2.0 standard drinks of alcohol per week. He reports that he does not use drugs.  Allergies: No Known Allergies  Medications: I have reviewed the patient's current medications.  Results for orders placed or performed during the hospital encounter of 04/18/2018 (from the past 48 hour(s))  Glucose, capillary     Status: Abnormal   Collection Time: 04/27/18 11:44 AM  Result Value Ref Range   Glucose-Capillary 137 (H) 70 - 99 mg/dL   Glucose, capillary     Status: Abnormal   Collection Time: 04/27/18  3:32 PM  Result Value Ref Range   Glucose-Capillary 137 (H) 70 - 99 mg/dL  Glucose, capillary     Status: None   Collection Time: 04/27/18  8:25 PM  Result Value Ref Range   Glucose-Capillary 94 70 - 99 mg/dL  Glucose, capillary     Status: Abnormal   Collection Time: 04/27/18 11:15 PM  Result Value Ref Range   Glucose-Capillary 122 (H) 70 - 99 mg/dL  Glucose, capillary     Status: Abnormal   Collection Time: 04/28/18  3:03 AM  Result Value Ref Range   Glucose-Capillary 125 (H) 70 - 99 mg/dL  CBC     Status: Abnormal   Collection Time: 04/28/18  6:20 AM  Result Value Ref Range   WBC 10.0 4.0 - 10.5 K/uL   RBC 3.52 (L) 4.22 - 5.81 MIL/uL   Hemoglobin 11.2 (L) 13.0 - 17.0 g/dL   HCT 33.9 (L) 39.0 - 52.0 %   MCV 96.3 80.0 - 100.0 fL   MCH 31.8 26.0 - 34.0 pg   MCHC 33.0 30.0 - 36.0 g/dL   RDW 14.3 11.5 - 15.5 %   Platelets 373 150 - 400 K/uL   nRBC 0.0 0.0 - 0.2 %  Comment: Performed at [facility], [place] [address] [place], [place] [address]  Basic metabolic panel     Status: Abnormal   Collection Time: 04/28/18  6:20 AM  Result Value Ref Range   Sodium 142 135 - 145 mmol/L   Potassium 4.1 3.5 - 5.1 mmol/L   Chloride 97 (L) 98 - 111 mmol/L   CO2 32 22 - 32 mmol/L   Glucose, Bld 148 (H) 70 - 99 mg/dL   BUN 9 8 - 23 mg/dL   Creatinine, Ser 0.90 0.61 - 1.24 mg/dL   Calcium 9.5 8.9 - 10.3 mg/dL   GFR calc non Af Amer >60 >60 mL/min   GFR calc Af Amer >60 >60 mL/min   Anion gap 13 5 - 15    Comment: Performed at [facility], [place] [address] [place], [place] [address]  Blood gas, arterial     Status: Abnormal   Collection Time: 04/28/18  6:47 AM  Result Value Ref Range   O2 Content 8.0 L/min   Delivery systems HEATED NASAL CANNULA    pH, Arterial 7.405 7.350 - 7.450   pCO2 arterial 54.3 (H) 32.0 - 48.0 mmHg   pO2, Arterial 82.5 (L) 83.0 - 108.0 mmHg   Bicarbonate 33.5 (H) 20.0 - 28.0  mmol/L   Acid-Base Excess 8.5 (H) 0.0 - 2.0 mmol/L   O2 Saturation 95.6 %   Patient temperature 98.0    Collection site RADIAL    Drawn by [id number]    Sample type ARTERIAL DRAW    Allens test (pass/fail) PASS PASS  Glucose, capillary     Status: Abnormal   Collection Time: 04/28/18  7:23 PM  Result Value Ref Range   Glucose-Capillary 131 (H) 70 - 99 mg/dL    Ct Soft Tissue Neck W Contrast  Result Date: 04/28/2018 CLINICAL DATA:  Solitary neck mass EXAM: CT NECK WITH CONTRAST TECHNIQUE: Multidetector CT imaging of the neck was performed using the standard protocol following the bolus administration of intravenous contrast. CONTRAST:  23mL OMNIPAQUE IOHEXOL 300 MG/ML  SOLN COMPARISON:  None. FINDINGS: Pharynx and larynx: There is a large tumor of the glottic and supraglottic larynx, crossing the anterior commissure, but predominantly located on the left side. Tumor invades through the thyroid cartilage and into the arytenoid cartilage. The tumor extends along the left aryepiglottic fold to the level of the epiglottis. The pre epiglottic fat is preserved. Pharynx and oral cavity are unremarkable. Salivary glands: No inflammation, mass, or stone. Thyroid: Normal. Lymph nodes: There are multiple enlarged and/or abnormal density lymph nodes. The largest is located at left level 5A and measures 4.1 x 3.7 cm (3:88) with a large area of central hypoattenuation. A more superior, necrotic left level 5A node measures 8 mm (3:69). Vascular: The left internal jugular vein is occluded at the level of the superior horn of the thyroid cartilage. Unclear whether it is actually invaded by the laryngeal mass, or occluded due to mass effect from nearby lymphadenopathy. The carotid arteries remain patent. Limited intracranial: Normal Visualized orbits: Normal Mastoids and visualized paranasal sinuses: Clear. Skeleton: No acute or aggressive process. Upper chest: Emphysema Other: None IMPRESSION: 1. Large laryngeal  carcinoma, predominantly left-sided and supraglottic, but crossing the anterior commissure, extending superiorly along the left aryepiglottic fold to the level of the epiglottis and inferiorly to the level of the glottis. Invasion through the thyroid cartilage and partial destruction of the left arytenoid cartilage. 2. Multiple enlarged and/or abnormal density left cervical lymph  nodes, consistent with metastatic disease. The largest node is located at level 5A and measures up to 4.1 cm, with a large area of central necrosis. 3. 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 Invasion from the laryngeal tumor seems more likely (3:71). 4.  Emphysema (ICD10-J43.9). Electronically Signed   By: Ulyses Jarred M.D.   On: 04/28/2018 22:25   Dg Swallowing Func-speech Pathology  Result Date: 04/28/2018 Objective Swallowing Evaluation: Type of Study: MBS-Modified Barium Swallow Study  Patient Details Name: Kurt [name] MRN: [id number] Date of Birth: 05-Aug-1940 Today's Date: 04/28/2018 Time: SLP Start Time (ACUTE ONLY): 1502 -SLP Stop Time (ACUTE ONLY): 1528 SLP Time Calculation (min) (ACUTE ONLY): 26 min Past Medical History: Past Medical History: Diagnosis Date . Abnormal chest x-ray  . Alcohol abuse  . Anxiety  . Atrial fibrillation ([place])  . Chronic insomnia  . Cigarette smoker  . Colonic polyp  . COPD (chronic obstructive pulmonary disease) ([place])  . Diverticulosis of colon  . DVT (deep venous thrombosis) ([place])  . Fatigue  . GERD (gastroesophageal reflux disease)  . History of pneumonia  . Hypercholesterolemia  . Lumbosacral spondylosis without myelopathy  . Neck pain  . Urinary frequency  Past Surgical History: Past Surgical History: Procedure Laterality Date . 3 seperate lumbar laminectomies   . right CTS release    Dr. Fredna Dow 12/00 HPI: 78yo w/ a hx of GERD, COPD, PNA, atrial fibrillation,  tobacco abuse, and BPH who presented w/ 2 days of shortness of breath.  CXR concerning for PNA in the bilateral bases. COVID-19 negative. Pt had a prior MBS in 2011 but results are not available. Pt describes several months of trouble swallowing, dysphonia, throat pain, and ear pain PTA.  Subjective: pt says he feels "weak" Assessment / Plan / Recommendation CHL IP CLINICAL IMPRESSIONS 04/28/2018 Clinical Impression Pt has a mild oral and severe pharyngeal dysphagia with suspicion for anatomical abnormalities contributing. Abnormal tissue appears to be present around the arytenoids and glottis, with attending MD notified of concern but radiologist not present to interperet. Oral phase is likely impacted by mild weakness, with slow posterior transit. Pharyngeally, pt has reduced epiglottic deflection and airway closure, which may be structural in nature. He has mild residue with thin liquids in his valleculae, increasing to moderate residue as boluses become thicker. Pt did not trigger a pharyngeal swallow with purees, letting them sit in his valleculae. SLP provided Max visual and verbal cueing, including use of video feedback, to elicit a volitional swallow. During pt's swallow phase he does not achieve adequate laryngeal closure, and subsequently aspirates thin and nectar thick liquids. Penetration occurs with purees. Airway compromise is primarily silent, but when he did cough, it was weak and ineffective at clearing the airway. Pt is not safe for a PO diet at this time. Discussed with MD my recommendation for pursuing underlying etiology of dysphagia to facilitate POC moving forward. In the mean time, would continue to use temporary alternative means of nutrition. SLP Visit Diagnosis Dysphagia, oropharyngeal phase (R13.12) Attention and concentration deficit following -- Frontal lobe and executive function deficit following -- Impact on safety and function Severe aspiration risk;Risk for inadequate  nutrition/hydration   CHL IP TREATMENT RECOMMENDATION 04/28/2018 Treatment Recommendations Therapy as outlined in treatment plan below   Prognosis 04/28/2018 Prognosis for Safe Diet Advancement Guarded Barriers to Reach Goals Severity of deficits;Other (Comment) Barriers/Prognosis Comment --  CHL IP DIET RECOMMENDATION 04/28/2018 SLP Diet Recommendations NPO;Alternative means - temporary Liquid Administration via -- Medication Administration Via alternative means Compensations -- Postural Changes --   CHL IP OTHER RECOMMENDATIONS 04/28/2018 Recommended Consults Consider ENT evaluation Oral Care Recommendations Oral care QID Other Recommendations Have oral suction available   CHL IP FOLLOW UP RECOMMENDATIONS 04/28/2018 Follow up Recommendations (No Data)   CHL IP FREQUENCY AND DURATION 04/28/2018 Speech Therapy Frequency (ACUTE ONLY) min 2x/week Treatment Duration 2 weeks      CHL IP ORAL PHASE 04/28/2018 Oral Phase Impaired Oral - Pudding Teaspoon -- Oral - Pudding Cup -- Oral - Honey Teaspoon -- Oral - Honey Cup -- Oral - Nectar Teaspoon Weak lingual manipulation;Reduced posterior propulsion;Decreased bolus cohesion Oral - Nectar Cup -- Oral - Nectar Straw -- Oral - Thin Teaspoon Weak lingual manipulation;Reduced posterior propulsion;Decreased bolus cohesion Oral - Thin Cup -- Oral - Thin Straw -- Oral - Puree Weak lingual manipulation;Reduced posterior propulsion;Decreased bolus cohesion Oral - Mech Soft -- Oral - Regular -- Oral - Multi-Consistency -- Oral - Pill -- Oral Phase - Comment --  CHL IP PHARYNGEAL PHASE 04/28/2018 Pharyngeal Phase Impaired Pharyngeal- Pudding Teaspoon -- Pharyngeal -- Pharyngeal- Pudding Cup -- Pharyngeal -- Pharyngeal- Honey Teaspoon -- Pharyngeal -- Pharyngeal- Honey Cup -- Pharyngeal -- Pharyngeal- Nectar Teaspoon Reduced epiglottic inversion;Reduced airway/laryngeal closure;Penetration/Aspiration during swallow;Pharyngeal residue - valleculae;Reduced tongue base retraction Pharyngeal  Material enters airway, passes BELOW cords without attempt by patient to eject out (silent aspiration) Pharyngeal- Nectar Cup -- Pharyngeal -- Pharyngeal- Nectar Straw -- Pharyngeal -- Pharyngeal- Thin Teaspoon Reduced epiglottic inversion;Reduced airway/laryngeal closure;Penetration/Aspiration during swallow;Pharyngeal residue - valleculae;Reduced tongue base retraction Pharyngeal Material enters airway, passes BELOW cords and not ejected out despite cough attempt by patient Pharyngeal- Thin Cup -- Pharyngeal -- Pharyngeal- Thin Straw -- Pharyngeal -- Pharyngeal- Puree Reduced epiglottic inversion;Reduced airway/laryngeal closure;Penetration/Aspiration during swallow;Pharyngeal residue - valleculae;Reduced tongue base retraction;Delayed swallow initiation-vallecula Pharyngeal Material enters airway, remains ABOVE vocal cords and not ejected out Pharyngeal- Mechanical Soft -- Pharyngeal -- Pharyngeal- Regular -- Pharyngeal -- Pharyngeal- Multi-consistency -- Pharyngeal -- Pharyngeal- Pill -- Pharyngeal -- Pharyngeal Comment --  CHL IP CERVICAL ESOPHAGEAL PHASE 04/28/2018 Cervical Esophageal Phase WFL Pudding Teaspoon -- Pudding Cup -- Honey Teaspoon -- Honey Cup -- Nectar Teaspoon -- Nectar Cup -- Nectar Straw -- Thin Teaspoon -- Thin Cup -- Thin Straw -- Puree -- Mechanical Soft -- Regular -- Multi-consistency -- Pill -- Cervical Esophageal Comment -- Venita Sheffield Nix 04/28/2018, 4:11 PM  Pollyann Glen, M.A. CCC-SLP Acute Rehabilitation Services Pager [phone] Office [phone]              ROS Blood pressure 93/66, pulse 100, temperature 98.7 F (37.1 C), temperature source Oral, resp. rate (!) 25, height 6' (1.829 m), weight 49.5 kg, SpO2 96 %. Physical Exam  Constitutional: He appears well-developed.  HENT:  Head: Normocephalic.  There is mass over the thyroid cartilage and left neck mass. There is no erythema of the skin. He doesn't seem to have any pain. He has very slight sound to his breathing but he  is not in any distress.   Eyes: Pupils are equal, round, and reactive to light.  Neck: Normal range of motion.    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 I have discussed with daughter about his situation and she is going to talk  to him about treatment surgery,radiation, or just trach. I will follow up after his wishes are clear.   Melissa Montane 04/29/2018, 9:56 AM

## 2018-04-29 NOTE — Consult Note (Signed)
Reason for the request:   Laryngeal mass  HPI: I was asked by Dr. Nevada [name]  to evaluate Kurt [name] with the new finding of a large laryngeal mass.  He is a 78 year old man with polysubstance abuse hospitalized on 04/20/2018 after presenting with symptoms of shortness of breath.  His work-up revealed a neck mass and a CT scan of neck showed a large laryngeal mass on the left side with multiple enlarged left cervical lymph node consistent with metastatic disease.  Based on these findings, he was evaluated by Dr. Janace [name] who offered him a tracheostomy for comfort and palliation.  I was asked to comment about his overall oncology outlook and evaluation for treatment options.  He is quite debilitated and cachectic and not able to give much history.  He appeared overall comfortable.  His review of system was difficult to obtain but denied any chest pain or fevers.  Remaining review of system was difficult to obtain.     Past Medical History:  Diagnosis Date  . Abnormal chest x-ray   . Alcohol abuse   . Anxiety   . Atrial fibrillation ([place])   . Chronic insomnia   . Cigarette smoker   . Colonic polyp   . COPD (chronic obstructive pulmonary disease) ([place])   . Diverticulosis of colon   . DVT (deep venous thrombosis) ([place])   . Fatigue   . GERD (gastroesophageal reflux disease)   . History of pneumonia   . Hypercholesterolemia   . Lumbosacral spondylosis without myelopathy   . Neck pain   . Urinary frequency   :  Past Surgical History:  Procedure Laterality Date  . 3 seperate lumbar laminectomies    . right CTS release     Dr. Fredna Dow 12/00  :   Current Facility-Administered Medications:  .  0.9 %  sodium chloride infusion, , Intravenous, PRN, Cherene Altes, MD, Stopped at 04/25/18 [phone] .  acetaminophen (TYLENOL) tablet 650 mg, 650 mg, Per Tube, Q6H PRN, Kamineni, Neelima, MD .  Ampicillin-Sulbactam (UNASYN) 3 g in sodium chloride 0.9 % 100 mL IVPB, 3 g, Intravenous, Q6H, Hall, Carole N, DO,  Last Rate: 200 mL/hr at 04/29/18 1013, 3 g at 04/29/18 1013 .  apixaban (ELIQUIS) tablet 5 mg, 5 mg, Per Tube, BID, Guilford Shi, MD, 5 mg at 04/29/18 0950 .  chlorhexidine (PERIDEX) 0.12 % solution 15 mL, 15 mL, Mouth Rinse, BID, Cherene Altes, MD, 15 mL at 04/29/18 0951 .  Chlorhexidine Gluconate Cloth 2 % PADS 6 each, 6 each, Topical, Q0600, Cherene Altes, MD, 6 each at 04/29/18 0535 .  diltiazem (CARDIZEM) 10 mg/ml oral suspension 30 mg, 30 mg, Per Tube, Q8H, Hall, Carole N, DO, 30 mg at 04/29/18 1623 .  furosemide (LASIX) injection 20 mg, 20 mg, Intravenous, BID, Hall, Carole N, DO, 20 mg at 04/29/18 2703 .  guaiFENesin-dextromethorphan (ROBITUSSIN DM) 100-10 MG/5ML syrup 15 mL, 15 mL, Per Tube, BID, Hall, Carole N, DO, 15 mL at 04/29/18 0950 .  ipratropium-albuterol (DUONEB) 0.5-2.5 (3) MG/3ML nebulizer solution 3 mL, 3 mL, Nebulization, Q2H PRN, Guilford Shi, MD, 3 mL at 04/28/18 0636 .  ipratropium-albuterol (DUONEB) 0.5-2.5 (3) MG/3ML nebulizer solution 3 mL, 3 mL, Nebulization, BID, Kamineni, Neelima, MD, 3 mL at 04/29/18 0830 .  LORazepam (ATIVAN) injection 0.25 mg, 0.25 mg, Intravenous, Q6H PRN, Dellinger, Marianne L, PA-C .  MEDLINE mouth rinse, 15 mL, Mouth Rinse, q12n4p, Cherene Altes, MD, 15 mL at 04/29/18 1245 .  metoprolol tartrate (LOPRESSOR) injection  5 mg, 5 mg, Intravenous, Q6H PRN, Guilford Shi, MD, 5 mg at 04/29/18 0415 .  mirabegron ER (MYRBETRIQ) tablet 50 mg, 50 mg, Oral, Daily, Rise Patience, MD, 50 mg at 04/28/18 0817 .  mometasone-formoterol (DULERA) 100-5 MCG/ACT inhaler 2 puff, 2 puff, Inhalation, BID, Rise Patience, MD, 2 puff at 04/28/18 1946 .  morphine 2 MG/ML injection 1 mg, 1 mg, Intravenous, Q4H PRN, Dellinger, Marianne L, PA-C .  nicotine (NICODERM CQ - dosed in mg/24 hours) patch 14 mg, 14 mg, Transdermal, Daily, Kamineni, Neelima, MD, 14 mg at 04/29/18 0950 .  ondansetron (ZOFRAN) tablet 4 mg, 4 mg, Oral, Q6H PRN  **OR** ondansetron (ZOFRAN) injection 4 mg, 4 mg, Intravenous, Q6H PRN, Rise Patience, MD, 4 mg at 04/27/18 0435 .  pantoprazole sodium (PROTONIX) 40 mg/20 mL oral suspension 40 mg, 40 mg, Per Tube, Daily, Guilford Shi, MD, 40 mg at 04/29/18 0950 .  QUEtiapine (SEROQUEL) tablet 25 mg, 25 mg, Per J Tube, QHS, Hall, Carole N, DO, 25 mg at 04/28/18 2127 .  sodium chloride HYPERTONIC 3 % nebulizer solution 4 mL, 4 mL, Nebulization, TID, Hall, Carole N, DO, 4 mL at 04/29/18 1517 .  tamsulosin (FLOMAX) capsule 0.4 mg, 0.4 mg, Oral, QPC supper, Rise Patience, MD, 0.4 mg at 04/28/18 1744:  No Known Allergies:  Family History  Problem Relation Age of Onset  . Prostate cancer Father   . Cancer Father        prostate  . Hypertension Sister   . Hypertension Mother   :  Social History   Socioeconomic History  . Marital status: Married    Spouse name: Not on file  . Number of children: 2  . Years of education: Not on file  . Highest education level: Not on file  Occupational History  . Occupation: Reitred from [employer]: [place]  . Financial resource strain: Not on file  . Food insecurity:    Worry: Not on file    Inability: Not on file  . Transportation needs:    Medical: Not on file    Non-medical: Not on file  Tobacco Use  . Smoking status: Current Every Day Smoker    Packs/day: 1.50    Years: 54.00    Pack years: 81.00    Types: Cigarettes  . Smokeless tobacco: Former Systems developer    Types: Chew  . Tobacco comment:  and still smokes sometimes but not everyday  Substance and Sexual Activity  . Alcohol use: Yes    Alcohol/week: 2.0 standard drinks    Types: 2 Cans of beer per week    Comment: occasional  . Drug use: No  . Sexual activity: Not Currently  Lifestyle  . Physical activity:    Days per week: Not on file    Minutes per session: Not on file  . Stress: Not on file  Relationships  . Social connections:    Talks on phone: Not  on file    Gets together: Not on file    Attends religious service: Not on file    Active member of club or organization: Not on file    Attends meetings of clubs or organizations: Not on file    Relationship status: Not on file  . Intimate partner violence:    Fear of current or ex partner: Not on file    Emotionally abused: Not on file    Physically abused: Not on file  Forced sexual activity: Not on file  Other Topics Concern  . Not on file  Social History Narrative   Lives with wife.    :  Pertinent items are noted in HPI.  Exam: Blood pressure 119/70, pulse (!) 122, temperature 97.8 F (36.6 C), resp. rate (!) 25, height 6' (1.829 m), weight 109 lb 2 oz (49.5 kg), SpO2 93 %. General appearance: Chronically ill gentleman.  Cachectic. Head: atraumatic without any abnormalities. Eyes: conjunctivae/corneas clear. PERRL.  Sclera anicteric. Throat: Palpable mass noted thyroid cartilage. Resp: Coarse lung sounds bilaterally. Cardio: Regular. GI: soft, non-tender; bowel sounds normal; no masses,  no organomegaly Skin: Skin color, texture, turgor normal. No rashes or lesions Lymph nodes: No lymphadenopathy palpated. Neurologic: Grossly normal without any motor, sensory or deep tendon reflexes. Musculoskeletal: No joint deformity or effusion.  Recent Labs    04/27/18 0644 04/28/18 0620  WBC 7.2 10.0  HGB 10.9* 11.2*  HCT 33.3* 33.9*  PLT 351 373   Recent Labs    04/28/18 0620  NA 142  K 4.1  CL 97*  CO2 32  GLUCOSE 148*  BUN 9  CREATININE 0.90  CALCIUM 9.5      Ct Soft Tissue Neck W Contrast  Result Date: 04/28/2018 CLINICAL DATA:  Solitary neck mass EXAM: CT NECK WITH CONTRAST TECHNIQUE: Multidetector CT imaging of the neck was performed using the standard protocol following the bolus administration of intravenous contrast. CONTRAST:  40mL OMNIPAQUE IOHEXOL 300 MG/ML  SOLN COMPARISON:  None. FINDINGS: Pharynx and larynx: There is a large tumor of the glottic  and supraglottic larynx, crossing the anterior commissure, but predominantly located on the left side. Tumor invades through the thyroid cartilage and into the arytenoid cartilage. The tumor extends along the left aryepiglottic fold to the level of the epiglottis. The pre epiglottic fat is preserved. Pharynx and oral cavity are unremarkable. Salivary glands: No inflammation, mass, or stone. Thyroid: Normal. Lymph nodes: There are multiple enlarged and/or abnormal density lymph nodes. The largest is located at left level 5A and measures 4.1 x 3.7 cm (3:88) with a large area of central hypoattenuation. A more superior, necrotic left level 5A node measures 8 mm (3:69). Vascular: The left internal jugular vein is occluded at the level of the superior horn of the thyroid cartilage. Unclear whether it is actually invaded by the laryngeal mass, or occluded due to mass effect from nearby lymphadenopathy. The carotid arteries remain patent. Limited intracranial: Normal Visualized orbits: Normal Mastoids and visualized paranasal sinuses: Clear. Skeleton: No acute or aggressive process. Upper chest: Emphysema Other: None IMPRESSION: 1. Large laryngeal carcinoma, predominantly left-sided and supraglottic, but crossing the anterior commissure, extending superiorly along the left aryepiglottic fold to the level of the epiglottis and inferiorly to the level of the glottis. Invasion through the thyroid cartilage and partial destruction of the left arytenoid cartilage. 2. Multiple enlarged and/or abnormal density left cervical lymph nodes, consistent with metastatic disease. The largest node is located at level 5A and measures up to 4.1 cm, with a large area of central necrosis. 3. 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 Invasion from the laryngeal tumor seems more likely (3:71). 4.  Emphysema  (ICD10-J43.9). Electronically Signed   By: Ulyses Jarred M.D.   On: 04/28/2018 22:25             Assessment and Plan:   78 year old man  with:  1.  Laryngeal mass and noted on a CT scan of the neck completed on 04/28/2018.  The tumor involved the supraglottic larynx predominantly on the left side invading into the thyroid cartilage.  Cervical adenopathy noted indicating stage IV disease.  The natural course of this disease was explained with the patient and treatment options at this time usually include a laryngectomy, chemotherapy and radiation versus supportive care only with tracheostomy.  He is quite cachectic and debilitated and would not be a candidate for any aggressive measures.  His performance status is poor and would not be a candidate for any cancer treatment at this time.  I have recommended with supportive care only and it would be reasonable to consider tracheostomy alone.  2.  Prognosis: Overall poor with limited life expectancy given his overall debilitation, poor nutritional status and advanced malignancy.  Life expectancy likely less than 6 months.   This was discussed extensively with his daughter Marlowe Kays over the phone and answered all her questions.  80  minutes was spent with the patient face-to-face today.  More than 50% of time was dedicated to reviewing imaging studies, laboratory data, discussing treatment options and complications related to therapy.  Prognosis as well as end-of-life issues were reviewed today over the phone with his daughter as well.

## 2018-04-29 NOTE — Progress Notes (Signed)
Dr. Nevada Crane made aware pt telemetry monitor diplaying aflutter waves for pt from previous sinus tachy. Informed by DO pt had hx of afib and was on cardizem and eliquis. No apparent distress noted by pt at this time.

## 2018-04-29 NOTE — Progress Notes (Signed)
Patient ID: Kurt [name], male   DOB: 1940/10/26, 78 y.o.   MRN: [id number] I discussed the situation with patient with nurse in room. I explained all his options then placed them on a paper and he chose to have a tracheotomy only. He does not want to treat the tumor. I talked to daughter again and she is going to be sure he is understanding completely and that he indeed wants to proceed with the trach only.

## 2018-04-29 NOTE — Progress Notes (Signed)
Physical Therapy Treatment Patient Details Name: Kurt [name] MRN: [id number] DOB: 08/02/1940 Today's Date: 04/29/2018    History of Present Illness 78 year old male with history of COPD, atrial fibrillation, tobacco use, BPH presented with complaints of cough, shortness of breath and fevers with T-max of 102F x2 days and was noted to be in A. fib with RVR.  Patient admitted for COPD exacerbation with pneumonia/hypoxic respiratory failure requiring BiPAP therapy.  He also has poor dentition and is on aspiration precautions/coverage.  His flu and CoVid19 testing resulted negative.     PT Comments    Patient sleeping upon entry, unable to progress this visit. Pt requesting to stay in bed, agreeable to therex. States he is too tired to stand and transfer to chair. Will cont to follow and progress as tolerated. Noted patient with higher O2 demands this visit now on 25L.    Follow Up Recommendations  CIR     Equipment Recommendations  Other (comment)(defer)    Recommendations for Other Services       Precautions / Restrictions Precautions Precautions: Fall Precaution Comments: ng tube Restrictions Weight Bearing Restrictions: No    Mobility  Bed Mobility Overal bed mobility: Needs Assistance Bed Mobility: Supine to Sit;Sit to Supine     Supine to sit: HOB elevated;Mod assist Sit to supine: HOB elevated;Mod assist   General bed mobility comments: patient weaker and more lethargic today, requesting to stay in bed. worked with bed level therex  Transfers                    Ambulation/Gait                 Stairs             Wheelchair Mobility    Modified Rankin (Stroke Patients Only)       Balance Overall balance assessment: Needs assistance Sitting-balance support: Feet supported;No upper extremity supported Sitting balance-Leahy Scale: Poor Sitting balance - Comments: B UE support required, fatigues easily    Standing balance support:  During functional activity;Single extremity supported Standing balance-Leahy Scale: Poor Standing balance comment: relaint on B UE and external support                            Cognition Arousal/Alertness: Awake/alert Behavior During Therapy: Flat affect Overall Cognitive Status: No family/caregiver present to determine baseline cognitive functioning Area of Impairment: Following commands;Awareness;Problem solving;Safety/judgement                       Following Commands: Follows one step commands consistently;Follows one step commands with increased time Safety/Judgement: Decreased awareness of safety;Decreased awareness of deficits Awareness: Emergent Problem Solving: Slow processing;Decreased initiation;Difficulty sequencing;Requires verbal cues;Requires tactile cues General Comments: lethargic       Exercises General Exercises - Lower Extremity Ankle Circles/Pumps: AROM;Both;10 reps;Supine Heel Slides: AROM;Both;10 reps Hip ABduction/ADduction: AROM;Both;10 reps;Supine Straight Leg Raises: AROM;Both;10 reps;Supine    General Comments        Pertinent Vitals/Pain Pain Assessment: No/denies pain    Home Living                      Prior Function            PT Goals (current goals can now be found in the care plan section) Acute Rehab PT Goals Patient Stated Goal: none stated PT Goal Formulation: With patient Time For Goal Achievement: 05/08/18 Potential  to Achieve Goals: Fair    Frequency    Min 3X/week      PT Plan Current plan remains appropriate    Co-evaluation              AM-PAC PT "6 Clicks" Mobility   Outcome Measure  Help needed turning from your back to your side while in a flat bed without using bedrails?: A Little Help needed moving from lying on your back to sitting on the side of a flat bed without using bedrails?: A Little Help needed moving to and from a bed to a chair (including a wheelchair)?: A  Lot Help needed standing up from a chair using your arms (e.g., wheelchair or bedside chair)?: A Lot Help needed to walk in hospital room?: A Lot Help needed climbing 3-5 steps with a railing? : Total 6 Click Score: 13    End of Session Equipment Utilized During Treatment: Oxygen Activity Tolerance: Patient limited by fatigue Patient left: in bed;with call bell/phone within reach Nurse Communication: Mobility status PT Visit Diagnosis: Unsteadiness on feet (R26.81);Other (comment)(dyspnea)     Time: 6168-3729 PT Time Calculation (min) (ACUTE ONLY): 15 min  Charges:  $Therapeutic Exercise: 8-22 mins                     Reinaldo Berber, PT, DPT Acute Rehabilitation Services Pager: [phone] Office: [phone]     Reinaldo Berber 04/29/2018, 10:07 AM

## 2018-04-29 NOTE — Progress Notes (Signed)
PROGRESS NOTE  Kurt [name] AXK:[id number] DOB: 02/12/1940 DOA: 05/01/2018 PCP: [name], NP  HPI/Recap of past 26 hours: 78 year old male with history of COPD, atrial fibrillation, tobacco use, BPH presented with complaints of cough, shortness of breath and fevers with T-max of 102F x2 days and was noted to be in A. fib with RVR.  Patient admitted for COPD exacerbation with community acquired pneumonia/hypoxic respiratory failure requiring BiPAP.  He also has poor dentition and is on aspiration precautions/coverage.  His flu and CoVid19 testing resulted negative.   Hospital course complicated by confusion and agitation for which he was started on Seroquel 25 mg daily, responded well to treatment.  Worsening hypoxia with concern for persistent aspiration for which tube feeding was temporarily discontinued.  Also complicated by newly diagnosed large laryngeal mass, incidentally found on CT neck with and without contrast done on 04/28/2018.  CT neck with and without contrast was ordered due to abnormal MBS.  ENT consulted and will see the patient.  04/29/18: Patient seen and examined at his bedside.  Respiratory distress reported overnight for which patient was placed on high flow nasal cannula with improvement of of O2 saturation.  Patient informed of newly found large laryngeal mass on CT neck done on 04/28/2018.  Obtained permission to inform his daughter.  All questions answered to her satisfaction.  ENT, Dr Janace Hoard, will see in consultation.  Assessment/Plan: Principal Problem:   Acute respiratory failure with hypoxia (HCC) Active Problems:   Atrial fibrillation with RVR (HCC)   COPD exacerbation (HCC)   Community acquired pneumonia   Pressure injury of skin   Protein-calorie malnutrition, severe  Acute hypoxic respiratory failure secondary to bilateral community-acquired pneumonia versus aspiration anemia versus large laryngeal mass Viral etiology ruled out On Unasyn for  aspiration pneumonia, continue to complete 10 days No leukocytosis Lactic acid 1.0 and procalcitonin less than 0.10 on 04/27/2018 Increased oxygen demand overnight requiring high flow nasal cannula Stopped tube feeding due to concern for persistent aspiration Abnormal MBS CT neck done on 04/28/2018 revealed large laryngeal mass with multiple enlarged lymph nodes consistent with metastases disease, largest measuring 4.1 cm with large area of central necrosis ENT consulted to further assess Continue oxygen supplementation Continue to maintain O2 saturation greater than 92% Continue IV Lasix 20 mg twice daily Continue continuous pulse oximetry Continue pulmonary toilet N.p.o.  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 Has coretract in place for feeding, hold off tube feeding until evaluated by ENT Maintain head of bed above 30 degree angle Aspiration precautions Continue pulmonary toilet with Mucinex GI tube feeding twice daily and flutter valve every 4 hours while awake  Resolving acute metabolic encephalopathy, possible hospital delirium Continue IV antibiotics for aspiration pneumonia Continue Seroquel 25 mg daily  Chronic A. fib with occasional RVR Continue diltiazem for rate control Continue Eliquis for CVA prevention  BPH Continue tamsulosin Continue to monitor urine  output Has condom catheter in place  Dysphagia Speech  therapist evaluated recommended n.p.o. Aspiration precautions Maintain head of bed elevated greater than 30 degree angle  Physical debility/ambulatory dysfunction PT assessed and recommended inpatient rehab Family declines inpatient rehab and wants patient to go home Fall precautions  Severe protein calorie malnutrition Albumin 2.0 and BMI 18 Oral supplement through core track    DVT prophylaxis: On apixaban Code Status: DNR Family / Patient Communication:  Updated daughter on the phone on 04/29/2018.  All questions answered to her satisfaction. Disposition Plan:  Home when ENT signs off.   Objective: Vitals:   04/29/18 0422 04/29/18 0559 04/29/18 0718 04/29/18 0804  BP: 93/66     Pulse:  93  100  Resp: (!) 42 (!) 24  (!) 25  Temp:    98.7 F (37.1 C)  TempSrc:    Oral  SpO2: 90% 98% 91% 96%  Weight:      Height:        Intake/Output Summary (Last 24 hours) at 04/29/2018 0848 Last data filed at 04/29/2018 0352 Gross per 24 hour  Intake 870 ml  Output 2850 ml  Net -1980 ml   Filed Weights   04/26/18 0544 04/27/18 0437 04/29/18 0416  Weight: 63.2 kg 61.8 kg 49.5 kg    Exam:   General: 78 y.o. year-old male frail-appearing in no acute distress.  Diffuse rales bilaterally with poor inspiratory effort.  He is alert and interactive.  Cardiovascular: Irregular rate and rhythm with no rubs or gallops.  No JVD or thyromegaly.    Respiratory: Diffuse rales bilaterally with poor inspiratory effort.    Abdomen: Soft with normal bowel sounds x4 quadrants  Psychiatry: Mood is appropriate for condition and setting.   Data Reviewed: CBC: Recent Labs  Lab 04/24/18 1141 04/26/18 0344 04/27/18 0644 04/28/18 0620  WBC 6.2 6.5 7.2 10.0  NEUTROABS  --  4.3 5.3  --   HGB 12.0* 11.6* 10.9* 11.2*  HCT 35.6* 35.6* 33.3* 33.9*  MCV 94.2 94.9 96.8 96.3  PLT 337 322 351 301   Basic Metabolic Panel: Recent Labs    Lab 04/23/18 0258 04/24/18 1141 04/24/18 1142 04/24/18 1833 04/25/18 0444 04/25/18 1834 04/26/18 0344 04/28/18 0620  NA 139 140  --   --  141  --  140 142  K 3.8 3.4*  --   --  3.5  --  4.4 4.1  CL 108 104  --   --  106  --  108 97*  CO2 23 26  --   --  27  --  23 32  GLUCOSE 72 102*  --   --  126*  --  100* 148*  BUN 5* <5*  --   --  6*  --  8 9  CREATININE 0.69 0.68  --   --  0.76  --  0.71 0.90  CALCIUM 8.6* 8.8*  --   --  8.9  --  8.8* 9.5  MG 1.8  --  1.8 1.9 1.9 2.0 2.1  --   PHOS 2.0*  --  2.5 2.2* 2.2* 1.8* 2.2*  --    GFR: Estimated Creatinine Clearance: 46.6 mL/min (by C-G formula based on SCr of 0.9 mg/dL). Liver Function Tests: Recent Labs  Lab 04/23/18 0258 04/26/18 0344  ALBUMIN 2.0* 2.0*   No results for input(s): LIPASE, AMYLASE in the last 168 hours. No results for input(s): AMMONIA in the last 168 hours. Coagulation Profile: No results for input(s): INR, PROTIME in the last 168 hours. Cardiac Enzymes: No results  for input(s): CKTOTAL, CKMB, CKMBINDEX, TROPONINI in the last 168 hours. BNP (last 3 results) No results for input(s): PROBNP in the last 8760 hours. HbA1C: No results for input(s): HGBA1C in the last 72 hours. CBG: Recent Labs  Lab 04/27/18 1532 04/27/18 2025 04/27/18 2315 04/28/18 0303 04/28/18 1923  GLUCAP 137* 94 122* 125* 131*   Lipid Profile: No results for input(s): CHOL, HDL, LDLCALC, TRIG, CHOLHDL, LDLDIRECT in the last 72 hours. Thyroid Function Tests: No results for input(s): TSH, T4TOTAL, FREET4, T3FREE, THYROIDAB in the last 72 hours. Anemia Panel: No results for input(s): VITAMINB12, FOLATE, FERRITIN, TIBC, IRON, RETICCTPCT in the last 72 hours. Urine analysis:    Component Value Date/Time   COLORURINE STRAW (A) 01/15/2016 1316   APPEARANCEUR CLEAR 01/15/2016 1316   LABSPEC 1.002 (L) 01/15/2016 1316   PHURINE 6.0 01/15/2016 1316   GLUCOSEU NEGATIVE 01/15/2016 1316   GLUCOSEU NEGATIVE 03/29/2010 1611   HGBUR  NEGATIVE 01/15/2016 1316   BILIRUBINUR NEGATIVE 01/15/2016 1316   KETONESUR NEGATIVE 01/15/2016 1316   PROTEINUR NEGATIVE 01/15/2016 1316   UROBILINOGEN 0.2 03/29/2010 1611   NITRITE NEGATIVE 01/15/2016 1316   LEUKOCYTESUR NEGATIVE 01/15/2016 1316   Sepsis Labs: @LABRCNTIP (procalcitonin:4,lacticidven:4)  ) Recent Results (from the past 240 hour(s))  Blood culture (routine x 2)     Status: None   Collection Time: 04/07/2018  7:15 PM  Result Value Ref Range Status   Specimen Description BLOOD RIGHT ANTECUBITAL  Final   Special Requests   Final    BOTTLES DRAWN AEROBIC AND ANAEROBIC Blood Culture results may not be optimal due to an inadequate volume of blood received in culture bottles   Culture   Final    NO GROWTH 5 DAYS Performed at [facility], [place] [address] [place], [place] [address]    Report Status 04/24/2018 FINAL  Final  Blood culture (routine x 2)     Status: None   Collection Time: 04/12/2018  7:35 PM  Result Value Ref Range Status   Specimen Description BLOOD BLOOD RIGHT FOREARM  Final   Special Requests   Final    BOTTLES DRAWN AEROBIC AND ANAEROBIC Blood Culture results may not be optimal due to an inadequate volume of blood received in culture bottles   Culture   Final    NO GROWTH 5 DAYS Performed at [facility], [place] [address], [place], [place] [address]    Report Status 04/24/2018 FINAL  Final  Respiratory Panel by PCR     Status: None   Collection Time: 04/10/2018  7:58 PM  Result Value Ref Range Status   Adenovirus NOT DETECTED NOT DETECTED Final   Coronavirus 229E NOT DETECTED NOT DETECTED Final    Comment: (NOTE) The Coronavirus on the Respiratory Panel, DOES NOT test for the novel  Coronavirus (2019 nCoV)    Coronavirus HKU1 NOT DETECTED NOT DETECTED Final   Coronavirus NL63 NOT DETECTED NOT DETECTED Final   Coronavirus OC43 NOT DETECTED NOT DETECTED Final   Metapneumovirus NOT DETECTED NOT DETECTED Final   Rhinovirus / Enterovirus NOT  DETECTED NOT DETECTED Final   Influenza A NOT DETECTED NOT DETECTED Final   Influenza B NOT DETECTED NOT DETECTED Final   Parainfluenza Virus 1 NOT DETECTED NOT DETECTED Final   Parainfluenza Virus 2 NOT DETECTED NOT DETECTED Final   Parainfluenza Virus 3 NOT DETECTED NOT DETECTED Final   Parainfluenza Virus 4 NOT DETECTED NOT DETECTED Final   Respiratory Syncytial Virus NOT DETECTED NOT DETECTED Final  Bordetella pertussis NOT DETECTED NOT DETECTED Final   Chlamydophila pneumoniae NOT DETECTED NOT DETECTED Final   Mycoplasma pneumoniae NOT DETECTED NOT DETECTED Final    Comment: Performed at [facility], [place] [address] [place], [place] [address]  Novel Coronavirus, NAA (hospital order; send-out to ref lab)     Status: None   Collection Time: 04/05/2018  7:58 PM  Result Value Ref Range Status   SARS-CoV-2, NAA Not Detected Not Detected Final    Comment: (NOTE) Testing was performed using the cobas(R) SARS-CoV-2 test. This test was developed and its performance characteristics determined by [employer]. This test has not been FDA cleared or approved. This test has been authorized by FDA under an Emergency Use Authorization (EUA). This test is only authorized for the duration of time the declaration that circumstances exist justifying the authorization of the emergency use of in vitro diagnostic tests for detection of SARS-CoV-2 virus and/or diagnosis of COVID-19 infection under section 564(b)(1) of the Act, 21 U.S.C. 604VWU-9(W)(1), unless the authorization is terminated or revoked sooner. Performed At: [facility] [address] [place], [place] [id number] Rush Farmer MD AO:[id number]    Coronavirus Source NASAL SWAB  Final    Comment: Performed at [facility], [place] [address] [place], [place] [address]  Urine culture     Status: None   Collection Time: 04/20/18  3:55 AM  Result Value Ref Range Status   Specimen Description URINE, RANDOM  Final     Special Requests NONE  Final   Culture   Final    NO GROWTH Performed at [facility], [place] [address] [place], [place] [address]    Report Status 04/21/2018 FINAL  Final  Expectorated sputum assessment w rflx to resp cult     Status: None   Collection Time: 04/20/18  4:00 AM  Result Value Ref Range Status   Specimen Description SPUTUM  Final   Special Requests NONE  Final   Sputum evaluation   Final    Sputum specimen not acceptable for testing.  Please recollect.   Gram Stain Report Called to,Read Back By and Verified With: RN K WHITE @ [phone] 04/20/18 BY Tobey Bride Performed at [facility], [place] [address], [place], [place] [address]    Report Status 04/20/2018 FINAL  Final  MRSA PCR Screening     Status: None   Collection Time: 04/20/18  4:05 AM  Result Value Ref Range Status   MRSA by PCR NEGATIVE NEGATIVE Final    Comment:        The GeneXpert MRSA Assay (FDA approved for NASAL specimens only), is one component of a comprehensive MRSA colonization surveillance program. It is not intended to diagnose MRSA infection nor to guide or monitor treatment for MRSA infections. Performed at [facility], [place] [address], [place], [place] [address]   Expectorated sputum assessment w rflx to resp cult     Status: None   Collection Time: 04/20/18 11:04 AM  Result Value Ref Range Status   Specimen Description SPUTUM  Final   Special Requests NONE  Final   Sputum evaluation   Final    THIS SPECIMEN IS ACCEPTABLE FOR SPUTUM CULTURE Performed at [facility], [place] [address] [place],  [place] [address]    Report Status 04/20/2018 FINAL  Final  Culture, respiratory     Status: None   Collection Time: 04/20/18 11:04 AM  Result Value Ref Range Status  Specimen Description SPUTUM  Final   Special Requests NONE Reflexed from [id number]  Final   Gram Stain   Final    FEW WBC PRESENT, PREDOMINANTLY PMN FEW GRAM POSITIVE COCCI RARE YEAST    Culture   Final    FEW  Consistent with normal respiratory flora. Performed at [facility], [place] [address], [place], [place] [address]    Report Status 04/22/2018 FINAL  Final      Studies: Ct Soft Tissue Neck W Contrast  Result Date: 04/28/2018 CLINICAL DATA:  Solitary neck mass EXAM: CT NECK WITH CONTRAST TECHNIQUE: Multidetector CT imaging of the neck was performed using the standard protocol following the bolus administration of intravenous contrast. CONTRAST:  81mL OMNIPAQUE IOHEXOL 300 MG/ML  SOLN COMPARISON:  None. FINDINGS: Pharynx and larynx: There is a large tumor of the glottic and supraglottic larynx, crossing the anterior commissure, but predominantly located on the left side. Tumor invades through the thyroid cartilage and into the arytenoid cartilage. The tumor extends along the left aryepiglottic fold to the level of the epiglottis. The pre epiglottic fat is preserved. Pharynx and oral cavity are unremarkable. Salivary glands: No inflammation, mass, or stone. Thyroid: Normal. Lymph nodes: There are multiple enlarged and/or abnormal density lymph nodes. The largest is located at left level 5A and measures 4.1 x 3.7 cm (3:88) with a large area of central hypoattenuation. A more superior, necrotic left level 5A node measures 8 mm (3:69). Vascular: The left internal jugular vein is occluded at the level of the superior horn of the thyroid cartilage. Unclear whether it is actually invaded by the laryngeal mass, or occluded due to mass effect from nearby lymphadenopathy. The carotid arteries remain patent. Limited intracranial: Normal Visualized orbits: Normal Mastoids and visualized paranasal sinuses: Clear. Skeleton: No acute or aggressive process. Upper chest: Emphysema Other: None IMPRESSION: 1. Large laryngeal carcinoma, predominantly left-sided and supraglottic, but crossing the anterior commissure, extending superiorly along the left aryepiglottic fold to the level of the epiglottis and inferiorly to the  level of the glottis. Invasion through the thyroid cartilage and partial destruction of the left arytenoid cartilage. 2. Multiple enlarged and/or abnormal density left cervical lymph nodes, consistent with metastatic disease. The largest node is located at level 5A and measures up to 4.1 cm, with a large area of central necrosis. 3. 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 Invasion from the laryngeal tumor seems more likely (3:71). 4.  Emphysema (ICD10-J43.9). Electronically Signed   By: Ulyses Jarred M.D.   On: 04/28/2018 22:25   Dg Swallowing Func-speech Pathology  Result Date: 04/28/2018 Objective Swallowing Evaluation: Type of Study: MBS-Modified Barium Swallow Study  Patient Details Name: EPHRAM KORNEGAY MRN: [id number] Date of Birth: [birth year] Today's Date: 04/28/2018 Time: SLP Start Time (ACUTE ONLY): 1502 -SLP Stop Time (ACUTE ONLY): 1528 SLP Time Calculation (min) (ACUTE ONLY): 26 min Past Medical History: Past Medical History: Diagnosis Date  Abnormal chest x-ray   Alcohol abuse   Anxiety   Atrial fibrillation (HCC)   Chronic insomnia   Cigarette smoker   Colonic polyp   COPD (chronic obstructive pulmonary disease) (HCC)   Diverticulosis of colon   DVT (deep venous thrombosis) (HCC)   Fatigue   GERD (gastroesophageal reflux disease)   History of pneumonia   Hypercholesterolemia   Lumbosacral spondylosis without myelopathy   Neck pain   Urinary frequency  Past Surgical History: Past Surgical History: Procedure Laterality Date  3 seperate lumbar laminectomies    right CTS release    Dr. Fredna Dow 12/00 HPI: 78yo w/ a hx of GERD, COPD, PNA, atrial fibrillation, tobacco abuse, and BPH who presented w/ 2 days of shortness of breath.  CXR concerning for PNA in the bilateral bases. COVID-19 negative. Pt had a prior MBS in 2011 but results are not available. Pt  describes several months of trouble swallowing, dysphonia, throat pain, and ear pain PTA.  Subjective: pt says he feels "weak" Assessment / Plan / Recommendation CHL IP CLINICAL IMPRESSIONS 04/28/2018 Clinical Impression Pt has a mild oral and severe pharyngeal dysphagia with suspicion for anatomical abnormalities contributing. Abnormal tissue appears to be present around the arytenoids and glottis, with attending MD notified of concern but radiologist not present to interperet. Oral phase is likely impacted by mild weakness, with slow posterior transit. Pharyngeally, pt has reduced epiglottic deflection and airway closure, which may be structural in nature. He has mild residue with thin liquids in his valleculae, increasing to moderate residue as boluses become thicker. Pt did not trigger a pharyngeal swallow with purees, letting them sit in his valleculae. SLP provided Max visual and verbal cueing, including use of video feedback, to elicit a volitional swallow. During pt's swallow phase he does not achieve adequate laryngeal closure, and subsequently aspirates thin and nectar thick liquids. Penetration occurs with purees. Airway compromise is primarily silent, but when he did cough, it was weak and ineffective at clearing the airway. Pt is not safe for a PO diet at this time. Discussed with MD my recommendation for pursuing underlying etiology of dysphagia to facilitate POC moving forward. In the mean time, would continue to use temporary alternative means of nutrition. SLP Visit Diagnosis Dysphagia, oropharyngeal phase (R13.12) Attention and concentration deficit following -- Frontal lobe and executive function deficit following -- Impact on safety and function Severe aspiration risk;Risk for inadequate nutrition/hydration   CHL IP TREATMENT RECOMMENDATION 04/28/2018 Treatment Recommendations Therapy as outlined in treatment plan below   Prognosis 04/28/2018 Prognosis for Safe Diet Advancement Guarded Barriers to  Reach Goals Severity of deficits;Other (Comment) Barriers/Prognosis Comment -- CHL IP DIET RECOMMENDATION 04/28/2018 SLP Diet Recommendations NPO;Alternative means - temporary Liquid Administration via -- Medication Administration Via alternative means Compensations -- Postural Changes --   CHL IP OTHER RECOMMENDATIONS 04/28/2018 Recommended Consults Consider ENT evaluation Oral Care Recommendations Oral care QID Other Recommendations Have oral suction available   CHL IP FOLLOW UP RECOMMENDATIONS 04/28/2018 Follow up Recommendations (No Data)   CHL IP FREQUENCY AND DURATION 04/28/2018 Speech Therapy Frequency (ACUTE ONLY) min 2x/week Treatment Duration 2 weeks      CHL IP ORAL PHASE 04/28/2018 Oral Phase Impaired Oral - Pudding Teaspoon -- Oral - Pudding Cup -- Oral - Honey Teaspoon -- Oral - Honey Cup -- Oral - Nectar Teaspoon Weak lingual manipulation;Reduced posterior propulsion;Decreased bolus cohesion Oral - Nectar Cup -- Oral - Nectar Straw -- Oral - Thin Teaspoon Weak lingual manipulation;Reduced posterior propulsion;Decreased bolus cohesion Oral - Thin Cup -- Oral - Thin Straw -- Oral - Puree Weak lingual manipulation;Reduced posterior propulsion;Decreased bolus cohesion Oral - Mech Soft -- Oral - Regular -- Oral - Multi-Consistency -- Oral - Pill -- Oral Phase - Comment --  CHL IP PHARYNGEAL PHASE 04/28/2018 Pharyngeal Phase Impaired Pharyngeal- Pudding Teaspoon -- Pharyngeal -- Pharyngeal- Pudding Cup -- Pharyngeal -- Pharyngeal- Honey Teaspoon -- Pharyngeal -- Pharyngeal- Honey Cup -- Pharyngeal -- Pharyngeal- Nectar Teaspoon Reduced  epiglottic inversion;Reduced airway/laryngeal closure;Penetration/Aspiration during swallow;Pharyngeal residue - valleculae;Reduced tongue base retraction Pharyngeal Material enters airway, passes BELOW cords without attempt by patient to eject out (silent aspiration) Pharyngeal- Nectar Cup -- Pharyngeal -- Pharyngeal- Nectar Straw -- Pharyngeal -- Pharyngeal- Thin Teaspoon Reduced  epiglottic inversion;Reduced airway/laryngeal closure;Penetration/Aspiration during swallow;Pharyngeal residue - valleculae;Reduced tongue base retraction Pharyngeal Material enters airway, passes BELOW cords and not ejected out despite cough attempt by patient Pharyngeal- Thin Cup -- Pharyngeal -- Pharyngeal- Thin Straw -- Pharyngeal -- Pharyngeal- Puree Reduced epiglottic inversion;Reduced airway/laryngeal closure;Penetration/Aspiration during swallow;Pharyngeal residue - valleculae;Reduced tongue base retraction;Delayed swallow initiation-vallecula Pharyngeal Material enters airway, remains ABOVE vocal cords and not ejected out Pharyngeal- Mechanical Soft -- Pharyngeal -- Pharyngeal- Regular -- Pharyngeal -- Pharyngeal- Multi-consistency -- Pharyngeal -- Pharyngeal- Pill -- Pharyngeal -- Pharyngeal Comment --  CHL IP CERVICAL ESOPHAGEAL PHASE 04/28/2018 Cervical Esophageal Phase WFL Pudding Teaspoon -- Pudding Cup -- Honey Teaspoon -- Honey Cup -- Nectar Teaspoon -- Nectar Cup -- Nectar Straw -- Thin Teaspoon -- Thin Cup -- Thin Straw -- Puree -- Mechanical Soft -- Regular -- Multi-consistency -- Pill -- Cervical Esophageal Comment -- Venita Sheffield Nix 04/28/2018, 4:11 PM  Pollyann Glen, M.A. CCC-SLP Acute Rehabilitation Services Pager [phone] Office [phone]              Scheduled Meds:  apixaban  5 mg Per Tube BID   chlorhexidine  15 mL Mouth Rinse BID   Chlorhexidine Gluconate Cloth  6 each Topical Q0600   diltiazem  30 mg Per Tube Q8H   furosemide  20 mg Intravenous BID   guaiFENesin-dextromethorphan  15 mL Oral BID   ipratropium-albuterol  3 mL Nebulization BID   mouth rinse  15 mL Mouth Rinse q12n4p   mirabegron ER  50 mg Oral Daily   mometasone-formoterol  2 puff Inhalation BID   nicotine  14 mg Transdermal Daily   pantoprazole sodium  40 mg Per Tube Daily   QUEtiapine  25 mg Per J Tube QHS   sodium chloride HYPERTONIC  4 mL Nebulization TID   tamsulosin  0.4 mg Oral QPC  supper    Continuous Infusions:  sodium chloride Stopped (04/25/18 0453)   ampicillin-sulbactam (UNASYN) IV 3 g (04/29/18 0421)     LOS: 10 days     Kayleen Memos, MD [facility] Pager [phone]  If 7PM-7AM, please contact night-coverage [email or web address] Password [facility] 04/29/2018, 8:48 AM

## 2018-04-30 ENCOUNTER — Telehealth: Payer: Self-pay | Admitting: *Deleted

## 2018-04-30 DIAGNOSIS — C799 Secondary malignant neoplasm of unspecified site: Secondary | ICD-10-CM

## 2018-04-30 LAB — CBC
HCT: 34.5 % — ABNORMAL LOW (ref 39.0–52.0)
Hemoglobin: 11.5 g/dL — ABNORMAL LOW (ref 13.0–17.0)
MCH: 31.4 pg (ref 26.0–34.0)
MCHC: 33.3 g/dL (ref 30.0–36.0)
MCV: 94.3 fL (ref 80.0–100.0)
Platelets: 423 10*3/uL — ABNORMAL HIGH (ref 150–400)
RBC: 3.66 MIL/uL — ABNORMAL LOW (ref 4.22–5.81)
RDW: 14.4 % (ref 11.5–15.5)
WBC: 12.1 10*3/uL — ABNORMAL HIGH (ref 4.0–10.5)
nRBC: 0 % (ref 0.0–0.2)

## 2018-04-30 LAB — BASIC METABOLIC PANEL
Anion gap: 9 (ref 5–15)
BUN: 14 mg/dL (ref 8–23)
CO2: 32 mmol/L (ref 22–32)
Calcium: 9.3 mg/dL (ref 8.9–10.3)
Chloride: 102 mmol/L (ref 98–111)
Creatinine, Ser: 0.96 mg/dL (ref 0.61–1.24)
GFR calc Af Amer: >60 mL/min (ref >60)
GFR calc non Af Amer: >60 mL/min (ref >60)
GLUCOSE: 115 mg/dL — AB (ref 70–99)
Potassium: 3.5 mmol/L (ref 3.5–5.1)
Sodium: 143 mmol/L (ref 135–145)

## 2018-04-30 LAB — GLUCOSE, CAPILLARY
GLUCOSE-CAPILLARY: 87 mg/dL (ref 70–99)
Glucose-Capillary: 99 mg/dL (ref 70–99)
Glucose-Capillary: 92 mg/dL (ref 70–99)
Glucose-Capillary: 115 mg/dL — ABNORMAL HIGH (ref 70–99)
Glucose-Capillary: 82 mg/dL (ref 70–99)
Glucose-Capillary: 68 mg/dL — ABNORMAL LOW (ref 70–99)
Glucose-Capillary: 124 mg/dL — ABNORMAL HIGH (ref 70–99)

## 2018-04-30 NOTE — Progress Notes (Signed)
Daily Progress Note   Patient Name: Kurt [name]       Date: 04/30/2018 DOB: September 03, 1940  Age: 78 y.o. MRN#: [id number] Attending Physician: Bonnell Public, MD Primary Care Physician: [name], NP Admit Date: 04/18/2018  Reason for Consultation/Follow-up: Establishing goals of care and Psychosocial/spiritual support  Subjective: Patient is very frustrated that he can not have coffee and applesauce.  This is all he has repeatedly asked for.  Marlowe Kays (daughter) is the primary contact with whom I have been speaking.  She and the patient are in agreement with tracheostomy placement and no surgery, radiation / chemotherapy.  We began to discuss feeding and aspiration.  At this point the patient is at high risk for aspiration and is NPO.  SLP has been reconsulted for recommendations for the safest possible diet (if the family accepts the risk of aspiration).   I explained to Marlowe Kays that the patient will likely aspirate on his own saliva.  Placing a cortrak or a PEG WILL NOT PREVENT aspiration.  I recommended that the family consider careful hand feeding with aspiration precautions.  This will give the patient some enjoyment.  We also discussed disposition after the hospitalization.  The family is considering [facility].  Visitation at [facility] is currently restricted to 2 visitors that will stay for the duration of the admission.  When the patient is actively dying there is the possibility to go to 4 visitors.    Marlowe Kays seemed overwhelmed and stated that she needed to discuss these things with her sister and Aunt before any decisions were made.   She wants to talk again tomorrow.   Assessment: Patient frustrated by not having coffee.  On 25L high flow at FIO2 of 60% with  difficulty speaking due to SOB.   Remains NPO as family does not want him to receive comfort feeds until a firm decision has been made.   Length of Stay: 11  Current Medications: Scheduled Meds:  . apixaban  5 mg Per Tube BID  . chlorhexidine  15 mL Mouth Rinse BID  . Chlorhexidine Gluconate Cloth  6 each Topical Q0600  . diltiazem  30 mg Per Tube Q8H  . furosemide  20 mg Intravenous BID  . guaiFENesin-dextromethorphan  15 mL Per Tube BID  . ipratropium-albuterol  3 mL Nebulization  BID  . mouth rinse  15 mL Mouth Rinse q12n4p  . mirabegron ER  50 mg Oral Daily  . mometasone-formoterol  2 puff Inhalation BID  . nicotine  14 mg Transdermal Daily  . pantoprazole sodium  40 mg Per Tube Daily  . QUEtiapine  25 mg Per J Tube QHS  . sodium chloride HYPERTONIC  4 mL Nebulization TID  . tamsulosin  0.4 mg Oral QPC supper    Continuous Infusions: . sodium chloride Stopped (04/25/18 0453)  . sodium chloride 50 mL/hr at 04/30/18 0617    PRN Meds: sodium chloride, acetaminophen, ipratropium-albuterol, LORazepam, metoprolol tartrate, morphine injection, ondansetron **OR** ondansetron (ZOFRAN) IV  Physical Exam        Cachectic frail man lying in bed.  Appears depressed.  SOB when speaking  Vital Signs: BP 97/63 (BP Location: Left Arm)   Pulse (!) 106   Temp 98.3 F (36.8 C) (Oral)   Resp (!) 21   Ht 6' (1.829 m)   Wt 57.2 kg   SpO2 100%   BMI 17.10 kg/m  SpO2: SpO2: 100 % O2 Device: O2 Device: High Flow Nasal Cannula O2 Flow Rate: O2 Flow Rate (L/min): 25 L/min  Intake/output summary:   Intake/Output Summary (Last 24 hours) at 04/30/2018 1517 Last data filed at 04/30/2018 0617 Gross per 24 hour  Intake 392.45 ml  Output 1950 ml  Net -1557.55 ml   LBM: Last BM Date: 04/28/18 Baseline Weight: Weight: 78 kg Most recent weight: Weight: 57.2 kg       Palliative Assessment/Data:  20%      Patient Active Problem List   Diagnosis Date Noted  . Laryngeal mass   .  Palliative care encounter   . Protein-calorie malnutrition, severe 04/24/2018  . Pressure injury of skin 04/21/2018  . COPD exacerbation ([place]) 04/20/2018  . Community acquired pneumonia 04/20/2018  . Acute respiratory failure with hypoxia ([place]) 04/28/2018  . Anxiety 01/21/2017  . HLD (hyperlipidemia) 01/21/2017  . BPH (benign prostatic hyperplasia) 01/23/2015  . Atrial fibrillation with RVR ([place]) 10/31/2009  . INSOMNIA, CHRONIC 12/21/2007  . LUMBOSACRAL SPONDYLOSIS WITHOUT MYELOPATHY 12/21/2007  . COPD mixed type ([place]) 07/24/2007  . GERD 07/24/2007    Palliative Care Plan    Recommendations/Plan:  PMT will continue to follow up with family.  There is agreement to place Trach.  No chemo, radiation, or surgery.  There is consideration being given to feeding and disposition.  Given patient's limited prognosis PMT recommends against a feeding tube of any sort.  Artificial feeding would not be continued at [facility].  Ideally the patient should be carefully hand fed comfort foods with aspiration precautions.   Code Status:  DNR  Prognosis:   < 2 weeks with out artificial hydration or nutrition.   Discharge Planning:  To Be Determined  Care plan was discussed with Marlowe Kays and Dr. Marthenia Rolling  Thank you for allowing the Palliative Medicine Team to assist in the care of this patient.  Total time spent:  35 min.     Greater than 50%  of this time was spent counseling and coordinating care related to the above assessment and plan.  Florentina Jenny, PA-C Palliative Medicine  Please contact Palliative MedicineTeam phone at [phone] for questions and concerns between 7 am - 7 pm.   Please see AMION for individual provider pager numbers.

## 2018-04-30 NOTE — Telephone Encounter (Signed)
Patient's daughter Kurt [name] calling. States she has discussed with her family and they have decided to go with a trach for her father and palliative care. Marlowe Kays wanted to thank dr Alen Blew for seeing her father and appreciated him taking time to discuss patient's care with  them

## 2018-04-30 NOTE — Progress Notes (Signed)
Occupational Therapy Treatment Patient Details Name: Kurt [name] MRN: [id number] DOB: September 04, 1940 Today's Date: 04/30/2018    History of present illness 78 year old male with history of COPD, atrial fibrillation, tobacco use, BPH presented with complaints of cough, shortness of breath and fevers with T-max of 102F x2 days and was noted to be in A. fib with RVR.  Patient admitted for COPD exacerbation with pneumonia/hypoxic respiratory failure requiring BiPAP therapy.  He also has poor dentition and is on aspiration precautions/coverage.  His flu and CoVid19 testing resulted negative.    OT comments  Pt having to use bathroom. Pt modA for bed mobility for trunk elevation and maxA for squat pivot transfer from bed to 3in1 commode. Pt performing ADL toilet hygiene with maxA and chair push-ups to assist with hygiene. Pt performing squat pivot to bed with maxA and rolling side to side for fixing bed linens with modA. In trendelenberg, pt able to assist with scooting towards HOB. Pt tolerating session fair with O2 sats 25L HFNC >90% and HR  <130BPM. Pt continues to benefit from continued OT. OT to follow acutely.     Follow Up Recommendations  CIR;Supervision/Assistance - 24 hour    Equipment Recommendations  3 in 1 bedside commode    Recommendations for Other Services      Precautions / Restrictions Precautions Precautions: Fall Precaution Comments: ng tube Restrictions Weight Bearing Restrictions: No       Mobility Bed Mobility Overal bed mobility: Needs Assistance Bed Mobility: Supine to Sit;Sit to Supine;Rolling Rolling: Mod assist   Supine to sit: HOB elevated;Mod assist Sit to supine: HOB elevated;Mod assist   General bed mobility comments: Pt tolerating sitting EOB, but unable to stay >2 mins as pt's SOB increases  Transfers Overall transfer level: Needs assistance   Transfers: Sit to/from Stand;Squat Pivot Transfers Sit to Stand: Max assist;From elevated surface    Squat pivot transfers: Max assist;From elevated surface(unable to tolerate sit to stand, barely able to clear BSC)     General transfer comment: Pt unable to take steps today requiring assist with squat pivot transfer    Balance Overall balance assessment: Needs assistance   Sitting balance-Leahy Scale: Fair       Standing balance-Leahy Scale: Poor Standing balance comment: relaint on B UE and external support                           ADL either performed or assessed with clinical judgement   ADL Overall ADL's : Needs assistance/impaired             Lower Body Bathing: Maximal assistance;Sitting/lateral leans;Sit to/from stand;Bed level       Lower Body Dressing: Maximal assistance;Sitting/lateral leans;Bed level   Toilet Transfer: Maximal assistance;BSC Toilet Transfer Details (indicate cue type and reason): pt reaching for BSC instead of holding onto therapist throughout squat pivot transfer         Functional mobility during ADLs: Moderate assistance;+2 for physical assistance;Rolling walker General ADL Comments: Pt tolerating toilet hygiene with maxA in performing w/c push-up from [facility] exhibiting decreased activity tolerance and an increase in rest breaks.     Vision       Perception     Praxis      Cognition Arousal/Alertness: Awake/alert Behavior During Therapy: Flat affect Overall Cognitive Status: Within Functional Limits for tasks assessed  General Comments: fatigues easily and wheezing after breathing treatment        Exercises     Shoulder Instructions       General Comments O2 levels 25L on HFNC with O2 sats remaining >90%  with exertion and HR <130 BPM    Pertinent Vitals/ Pain       Pain Assessment: 0-10 Pain Score: 3  Pain Location: bottom  Pain Intervention(s): Monitored during session  Home Living                                          Prior  Functioning/Environment              Frequency  Min 3X/week        Progress Toward Goals  OT Goals(current goals can now be found in the care plan section)  Progress towards OT goals: Progressing toward goals  Acute Rehab OT Goals Patient Stated Goal: To go home OT Goal Formulation: With patient Time For Goal Achievement: 05/10/18 Potential to Achieve Goals: Good ADL Goals Pt Will Perform Grooming: with supervision;sitting Pt Will Perform Lower Body Dressing: with supervision;sit to/from stand Pt Will Transfer to Toilet: with supervision;ambulating;bedside commode Pt/caregiver will Perform Home Exercise Program: Increased strength;Both right and left upper extremity;With written HEP provided Additional ADL Goal #1: Pt will follow 2 step commands with 90% accuracy in order to maximize participation and independence with ADLs.  Plan Discharge plan remains appropriate    Co-evaluation                 AM-PAC OT "6 Clicks" Daily Activity     Outcome Measure   Help from another person eating meals?: Total Help from another person taking care of personal grooming?: Total Help from another person toileting, which includes using toliet, bedpan, or urinal?: A Lot Help from another person bathing (including washing, rinsing, drying)?: A Lot Help from another person to put on and taking off regular upper body clothing?: A Lot Help from another person to put on and taking off regular lower body clothing?: A Lot 6 Click Score: 10    End of Session Equipment Utilized During Treatment: Oxygen  OT Visit Diagnosis: Other abnormalities of gait and mobility (R26.89);Muscle weakness (generalized) (M62.81)   Activity Tolerance Patient limited by fatigue   Patient Left in bed;with call bell/phone within reach;with bed alarm set   Nurse Communication Other (comment)(catheter unattached)        Time: 0932-6712 OT Time Calculation (min): 28 min  Charges: OT General  Charges $OT Visit: 1 Visit OT Treatments $Self Care/Home Management : 8-22 mins $Neuromuscular Re-education: 8-22 mins  Darryl Nestle) Marsa Aris OTR/L Acute Rehabilitation Services Pager: [phone] Office: [phone]    Fredda Hammed 04/30/2018, 9:34 AM

## 2018-04-30 NOTE — Progress Notes (Signed)
Inpatient Rehabilitation Admissions Coordinator  Noted Palliative Care discussions and Oncology. Pt continues not to be at a level for more intense therapies. I contacted Neoma Laming, RN CM, and we will sign off at this time.   Danne Baxter, RN, MSN Rehab Admissions Coordinator [phone] 04/30/2018 12:07 PM

## 2018-04-30 NOTE — Progress Notes (Signed)
PT Cancellation Note  Patient Details Name: Kurt [name] MRN: [id number] DOB: 04-10-1940   Cancelled Treatment:    Reason Eval/Treat Not Completed: Other (comment) Pt currently receiving breathing treatment. Will follow up as schedule allows and as pt appropriate.   [name], PT, DPT  Acute Rehabilitation Services  Pager: [phone] Office: [phone]    Rudean Hitt 04/30/2018, 1:18 PM

## 2018-04-30 NOTE — Progress Notes (Signed)
Physical Therapy Treatment Patient Details Name: Kurt [name] MRN: [id number] DOB: January 31, 1941 Today's Date: 04/30/2018    History of Present Illness 78 year old male with history of COPD, atrial fibrillation, tobacco use, BPH presented with complaints of cough, shortness of breath and fevers with T-max of 102F x2 days and was noted to be in A. fib with RVR.  Patient admitted for COPD exacerbation with pneumonia/hypoxic respiratory failure requiring BiPAP therapy.  He also has poor dentition and is on aspiration precautions/coverage.  His flu and CoVid19 testing resulted negative. 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 Per notes, plan is to get trach and follow with palliative care.     PT Comments    Pt with slow progression towards goals. Pt required min A for rolling multiple times for clean up following BM. HR elevated to mid 120s, oxygen sats decreasing to 87% on 25L, and RR up to 40 during basic bed mobility, therefore further mobility deferred. VSS upon resting. Per notes, pt not appropriate for CIR, therefore updated recommendations and frequency. Will continue to follow acutely to maximize functional mobility independence and safety.    Follow Up Recommendations  SNF     Equipment Recommendations  Other (comment)(TBD)    Recommendations for Other Services       Precautions / Restrictions Precautions Precautions: Fall Precaution Comments: ng tube Restrictions Weight Bearing Restrictions: No    Mobility  Bed Mobility Overal bed mobility: Needs Assistance Bed Mobility: Rolling Rolling: Min assist         General bed mobility comments: Min A for rolling from side to side for clean up following BM. Pt using bed rails to assist with rolling tasks. Oxygen sats decreasing to 87% on 25L and HR increasing to min 120s, and RR at 40 during basic bed mobility tasks, therefore, further mobility deferred. Pt fatiguing very easily. VSS upon resting.    Transfers                    Ambulation/Gait                 Stairs             Wheelchair Mobility    Modified Rankin (Stroke Patients Only)       Balance                                            Cognition Arousal/Alertness: Awake/alert Behavior During Therapy: Flat affect Overall Cognitive Status: Within Functional Limits for tasks assessed                                        Exercises      General Comments        Pertinent Vitals/Pain Pain Assessment: Faces Faces Pain Scale: Hurts a little bit Pain Location: bottom  Pain Descriptors / Indicators: Grimacing Pain Intervention(s): Limited activity within patient's tolerance;Monitored during session;Repositioned    Home Living                      Prior Function            PT Goals (current goals can now be found in the care plan section) Acute Rehab PT Goals Patient Stated Goal: To go home  PT Goal Formulation: With patient Time For Goal Achievement: 05/08/18 Potential to Achieve Goals: Fair Progress towards PT goals: Progressing toward goals    Frequency    Min 2X/week      PT Plan Discharge plan needs to be updated;Frequency needs to be updated    Co-evaluation              AM-PAC PT "6 Clicks" Mobility   Outcome Measure  Help needed turning from your back to your side while in a flat bed without using bedrails?: A Little Help needed moving from lying on your back to sitting on the side of a flat bed without using bedrails?: A Little Help needed moving to and from a bed to a chair (including a wheelchair)?: A Lot Help needed standing up from a chair using your arms (e.g., wheelchair or bedside chair)?: A Lot Help needed to walk in hospital room?: A Lot Help needed climbing 3-5 steps with a railing? : Total 6 Click Score: 13    End of Session Equipment Utilized During Treatment: Oxygen Activity Tolerance:  Patient limited by fatigue;Treatment limited secondary to medical complications (Comment) Patient left: in bed;with call bell/phone within reach;with bed alarm set Nurse Communication: Mobility status PT Visit Diagnosis: Unsteadiness on feet (R26.81);Difficulty in walking, not elsewhere classified (R26.2)     Time: 4627-0350 PT Time Calculation (min) (ACUTE ONLY): 20 min  Charges:  $Therapeutic Activity: 8-22 mins                     [name], PT, DPT  Acute Rehabilitation Services  Pager: [phone] Office: [phone]    Rudean Hitt 04/30/2018, 2:50 PM

## 2018-04-30 NOTE — Progress Notes (Signed)
PROGRESS NOTE  Kurt [name] YTK:[id number] DOB: Oct 28, 1940 DOA: 04/18/2018 PCP: [name], NP  HPI/Recap of past 31 hours: 78 year old male with history of COPD, atrial fibrillation, tobacco use, BPH presented with complaints of cough, shortness of breath and fevers with T-max of 102F x2 days and was noted to be in A. fib with RVR.  Patient admitted for COPD exacerbation with community acquired pneumonia/hypoxic respiratory failure requiring BiPAP.  He also has poor dentition and is on aspiration precautions/coverage.  His flu and CoVid19 testing resulted negative.   Hospital course complicated by confusion and agitation for which he was started on Seroquel 25 mg daily, responded well to treatment.  Worsening hypoxia with concern for persistent aspiration for which tube feeding was temporarily discontinued.  Also complicated by newly diagnosed large laryngeal mass, incidentally found on CT neck with and without contrast done on 04/28/2018.  CT neck with and without contrast was ordered due to abnormal MBS.  ENT consulted and will see the patient.  04/29/18: Patient seen and examined at his bedside.  Respiratory distress reported overnight for which patient was placed on high flow nasal cannula with improvement of of O2 saturation.  Patient informed of newly found large laryngeal mass on CT neck done on 04/28/2018.  Obtained permission to inform his daughter.  All questions answered to her satisfaction.  ENT, Dr Janace Hoard, will see in consultation.  04/30/2018: Patient seen.  Discussed with patient's daughter over the phone.  Discussed with palliative care nurse, Haynes Dage.  The plan is for new tracheostomy.  No PEG tube is planned.  Discussion between palliative care team and patient's family is trending towards comfort directed care.  No significant history from patient.  Assessment/Plan: Principal Problem:   Acute respiratory failure with hypoxia (HCC) Active Problems:   Atrial fibrillation  with RVR (HCC)   COPD exacerbation (HCC)   Community acquired pneumonia   Pressure injury of skin   Protein-calorie malnutrition, severe   Laryngeal mass   Palliative care encounter  Acute hypoxic respiratory failure secondary to bilateral community-acquired pneumonia versus aspiration anemia versus large laryngeal mass Viral etiology ruled out On Unasyn for aspiration pneumonia, continue to complete 10 days No leukocytosis Lactic acid 1.0 and procalcitonin less than 0.10 on 04/27/2018 Increased oxygen demand overnight requiring high flow nasal cannula Stopped tube feeding due to concern for persistent aspiration Abnormal MBS CT neck done on 04/28/2018 revealed 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 04/30/2018: Discontinue IV Lasix.  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  No PEG tube is planned for now.  The goal is tilting towards comfort directed measures.  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  Suspected aspiration pneumonia Continue Unasyn for 10 days Continue n.p.o. Has coretract in place for feeding, hold off tube feeding until evaluated by ENT Maintain head of bed above 30 degree angle Aspiration precautions Continue pulmonary  toilet with  Mucinex GI tube feeding twice daily and flutter valve every 4 hours while awake 04/30/2018: Further care will depend on final goal of care.  Resolving acute metabolic encephalopathy, possible hospital delirium Continue IV antibiotics for aspiration pneumonia Continue Seroquel 25 mg daily  04/30/2018: Stable.  Chronic A. fib with occasional RVR Continue diltiazem for rate control Continue Eliquis for CVA prevention 04/30/2018: Further care will depend on goal of care.  BPH Continue tamsulosin Continue to monitor urine output Has condom catheter in place  Dysphagia Speech therapist evaluated recommended n.p.o. Aspiration precautions Maintain head of bed elevated greater than 30 degree angle  Physical debility/ambulatory dysfunction PT assessed and recommended inpatient rehab Family declines inpatient rehab and wants patient to go home Fall precautions  Severe protein calorie malnutrition Albumin 2.0 and BMI 18 Oral supplement through core track Poor/guarded prognosis.  DVT prophylaxis: On apixaban Code Status: DNR Family / Patient Communication:  Updated daughter on the phone on 04/29/2018.  All questions answered to her satisfaction. Disposition Plan:  Home when ENT signs off.   Objective: Vitals:   04/30/18 0418 04/30/18 0625 04/30/18 0744 04/30/18 0811  BP: 99/61 97/74  103/79  Pulse: 99  94 (!) 108  Resp: (!) 25  (!) 24 (!) 26  Temp: (!) 97.2 F (36.2 C)   98.4 F (36.9 C)  TempSrc: Axillary   Oral  SpO2: 93%  93% 97%  Weight: 57.2 kg     Height:        Intake/Output Summary (Last 24 hours) at 04/30/2018 0932 Last data filed at 04/30/2018 0617 Gross per 24 hour  Intake 392.45 ml  Output 1950 ml  Net -1557.55 ml   Filed Weights   04/27/18 0437 04/29/18 0416 04/30/18 0418  Weight: 61.8 kg 49.5 kg 57.2 kg    Exam:  . General: 78 y.o. year-old male frail-appearing in no acute distress.  Acutely ill looking.   . Cardiovascular: S1-S2. Marland Kitchen  Respiratory: Decreased air entry.   . Abdomen: Soft with normal bowel sounds x4 quadrants . Neuro: Patient is awake and alert.  Patient moves all limbs.    Data Reviewed: CBC: Recent Labs  Lab 04/24/18 1141 04/26/18 0344 04/27/18 0644 04/28/18 0620 04/30/18 0341  WBC 6.2 6.5 7.2 10.0 12.1*  NEUTROABS  --  4.3 5.3  --   --   HGB 12.0* 11.6* 10.9* 11.2* 11.5*  HCT 35.6* 35.6* 33.3* 33.9* 34.5*  MCV 94.2 94.9 96.8 96.3 94.3  PLT 337 322 351 373 169*   Basic Metabolic Panel: Recent Labs  Lab 04/24/18 1141 04/24/18 1142 04/24/18 1833 04/25/18 0444 04/25/18 1834 04/26/18 0344 04/28/18 0620 04/30/18 0341  NA 140  --   --  141  --  140 142 143  K 3.4*  --   --  3.5  --  4.4 4.1 3.5  CL 104  --   --  106  --  108 97* 102  CO2 26  --   --  27  --  23 32 32  GLUCOSE 102*  --   --  126*  --  100* 148* 115*  BUN <5*  --   --  6*  --  8 9 14   CREATININE 0.68  --   --  0.76  --  0.71 0.90 0.96  CALCIUM 8.8*  --   --  8.9  --  8.8* 9.5 9.3  MG  --  1.8 1.9 1.9 2.0 2.1  --   --   PHOS  --  2.5 2.2* 2.2* 1.8* 2.2*  --   --    GFR: Estimated Creatinine Clearance: 50.5 mL/min (by C-G formula based on SCr of 0.96 mg/dL). Liver Function Tests: Recent Labs  Lab 04/26/18 0344  ALBUMIN 2.0*   No results for input(s): LIPASE, AMYLASE in the last 168 hours. No results for input(s): AMMONIA in the last 168 hours. Coagulation Profile: No results for input(s): INR, PROTIME in the last 168 hours. Cardiac Enzymes: No results for input(s): CKTOTAL, CKMB, CKMBINDEX, TROPONINI in the last 168 hours. BNP (last 3 results) No results for input(s): PROBNP in the last 8760 hours. HbA1C: No results for input(s): HGBA1C in the last 72 hours. CBG: Recent Labs  Lab 04/29/18 1622 04/29/18 2018 04/29/18 2337 04/30/18 0416 04/30/18 0808  GLUCAP 100* 90 110* 99 92   Lipid Profile: No results for input(s): CHOL, HDL, LDLCALC, TRIG, CHOLHDL, LDLDIRECT in the last 72 hours. Thyroid Function  Tests: No results for input(s): TSH, T4TOTAL, FREET4, T3FREE, THYROIDAB in the last 72 hours. Anemia Panel: No results for input(s): VITAMINB12, FOLATE, FERRITIN, TIBC, IRON, RETICCTPCT in the last 72 hours. Urine analysis:    Component Value Date/Time   COLORURINE STRAW (A) 01/15/2016 1316   APPEARANCEUR CLEAR 01/15/2016 1316   LABSPEC 1.002 (L) 01/15/2016 1316   PHURINE 6.0 01/15/2016 1316   GLUCOSEU NEGATIVE 01/15/2016 1316   GLUCOSEU NEGATIVE 03/29/2010 1611   HGBUR NEGATIVE 01/15/2016 1316   BILIRUBINUR NEGATIVE 01/15/2016 1316   KETONESUR NEGATIVE 01/15/2016 1316   PROTEINUR NEGATIVE 01/15/2016 1316   UROBILINOGEN 0.2 03/29/2010 1611   NITRITE NEGATIVE 01/15/2016 1316   LEUKOCYTESUR NEGATIVE 01/15/2016 1316   Sepsis Labs: @LABRCNTIP (procalcitonin:4,lacticidven:4)  ) Recent Results (from the past 240 hour(s))  Expectorated sputum assessment w rflx to resp cult     Status: None   Collection Time: 04/20/18 11:04 AM  Result Value Ref Range Status   Specimen Description SPUTUM  Final   Special Requests NONE  Final   Sputum evaluation   Final    THIS SPECIMEN IS ACCEPTABLE FOR SPUTUM CULTURE Performed at [facility], [place] [address], [place], [place] [address]    Report Status 04/20/2018 FINAL  Final  Culture, respiratory     Status: None   Collection Time: 04/20/18 11:04 AM  Result Value Ref Range Status   Specimen Description SPUTUM  Final   Special Requests NONE Reflexed from [id number]  Final   Gram Stain   Final    FEW WBC PRESENT, PREDOMINANTLY PMN FEW GRAM POSITIVE COCCI RARE YEAST    Culture   Final    FEW Consistent with normal respiratory flora. Performed at [facility], [place] [address], [place], [place] [address]    Report Status 04/22/2018 FINAL  Final      Studies: No results found.  Scheduled Meds: . apixaban  5 mg Per Tube BID  . chlorhexidine  15 mL Mouth Rinse BID  . Chlorhexidine Gluconate Cloth  6 each Topical Q0600  . diltiazem   30 mg Per Tube Q8H  . furosemide  20 mg Intravenous BID  . guaiFENesin-dextromethorphan  15 mL Per Tube BID  . ipratropium-albuterol  3 mL Nebulization BID  . mouth rinse  15 mL Mouth Rinse q12n4p  . mirabegron ER  50 mg Oral Daily  . mometasone-formoterol  2 puff Inhalation BID  . nicotine  14 mg Transdermal Daily  . pantoprazole sodium  40 mg Per Tube Daily  . QUEtiapine  25 mg Per J  Tube QHS  . sodium chloride HYPERTONIC  4 mL Nebulization TID  . tamsulosin  0.4 mg Oral QPC supper    Continuous Infusions: . sodium chloride Stopped (04/25/18 0453)  . sodium chloride 50 mL/hr at 04/30/18 0617     LOS: 11 days     Bonnell Public, MD [facility] Pager [phone]  If 7PM-7AM, please contact night-coverage [email or web address] Password [facility] 04/30/2018, 9:32 AM

## 2018-05-01 LAB — GLUCOSE, CAPILLARY
Glucose-Capillary: 94 mg/dL (ref 70–99)
Glucose-Capillary: 99 mg/dL (ref 70–99)
Glucose-Capillary: 114 mg/dL — ABNORMAL HIGH (ref 70–99)
Glucose-Capillary: 153 mg/dL — ABNORMAL HIGH (ref 70–99)
Glucose-Capillary: 245 mg/dL — ABNORMAL HIGH (ref 70–99)

## 2018-05-01 MED ORDER — MORPHINE SULFATE (PF) 2 MG/ML IV SOLN
2.0000 mg | INTRAVENOUS | Status: DC
  Administered 2018-05-01 – 2018-05-02 (×5): 2 mg via INTRAVENOUS
  Filled 2018-05-01 (×5): qty 1

## 2018-05-01 MED ORDER — LORAZEPAM 2 MG/ML IJ SOLN
0.2500 mg | Freq: Two times a day (BID) | INTRAMUSCULAR | Status: DC
  Administered 2018-05-01 (×2): 0.25 mg via INTRAVENOUS
  Filled 2018-05-01 (×2): qty 1

## 2018-05-01 NOTE — Progress Notes (Signed)
PROGRESS NOTE    Kurt [name]  GQQ:[id number] DOB: 1940-07-01 DOA: 04/05/2018 PCP: [name], NP    Brief Narrative:  78 year old male who presented with dyspnea.  He does have significant past medical history for COPD, atrial fibrillation, tobacco abuse and BPH.  He reported worsening days of the last 48 hours, associated with wheezing and increased mucus production.  On his initial physical examination he was febrile 102 F, his heart rate was 122, respiratory rate 33, oxygen saturation 96% on supplemental oxygen.  His lungs had positive breath sounds bilaterally, no wheezing, heart S1-S2 present, irregularly irregular, tachycardic, abdomen soft, no lower extremity edema.  Sodium 143, potassium 3.8, chloride 102, bicarb 28, glucose 153, BUN 19, creatinine 0.96, white count 8.8, hemoglobin 11.3, hematocrit 34.4, platelets 347.  His chest radiograph had bilateral lower lobe infiltrates, more prominent on the right.  EKG had 132 bpm, atrial flutter with variable block.  Patient was admitted to the hospital with a working diagnosis of acute hypoxic respiratory failure complicated by sepsis due to bilateral lower lobe pneumonia.  Patient had a prolonged hospital stay, he has required noninvasive mechanical ventilation and high flow nasal cannula for persistent hypoxemia, his influenza and COVID-19 were negative.  He was diagnosed with a large laryngeal mass.  ENT was consulted with recommendations for tracheostomy.  Patient's family has requested palliative care consultation.   Assessment & Plan:   Principal Problem:   Acute respiratory failure with hypoxia (HCC) Active Problems:   Atrial fibrillation with RVR (HCC)   COPD exacerbation (HCC)   Community acquired pneumonia   Pressure injury of skin   Protein-calorie malnutrition, severe   Laryngeal mass   Palliative care encounter   Metastatic cancer ([place])   1. Acute hypoxic respiratory failure due to bilateral lower lobe  aspiration pneumonia, complicated with a large laryngeal carcinoma. Patient continue to have high oxygen requirements, 25 LPM per high flow nasal cannula, oxygen saturation 88 to 90%. Patient has completed course of antibiotic therapy. He has remained afebrile, wbc 12.1 from 10,0. Patient very debilitated and poor functional status, has laryngeal carcinoma with signs of metastasis, not candidate for any aggressive measures . Very poor prognosis. Palliative care meeting today, patient's family have decided to continue care under hospice.   2. Acute metabolic encephalopathy with delirium. Will continue neuro checks per unit protocol, will allow patient to eat by mouth per comfort measures. Continue seroquel.   3. Atrial fibrillation/ flutter. Rate has remained control with diltiazem, will continue current anticoagulation for now, will have low threshold to discontinue in case any side effect.   4. Swallow dysfunction. Very poor prognosis, speech therapy identified patient as a hight aspiration risk. For now will liberate his diet for comfort measures. His family is aware of the risk of aspiration.   5. Severe protein calorie malnutrition. Continue nutritional support as tolerated.   6. Pressure ulcer. Deep tissue injury at the sacrum, unable to stage, present on admission.    DVT prophylaxis: apixaban   Code Status: dnr  Family Communication: no family at the bedside  Disposition Plan/ discharge barriers: Hospice.   Body mass index is 17.1 kg/m. Malnutrition Type:  Nutrition Problem: Severe Malnutrition Etiology: chronic illness(COPD)   Malnutrition Characteristics:  Signs/Symptoms: severe muscle depletion, severe fat depletion, percent weight loss(14% weight loss within 6 months) Percent weight loss: 14 %   Nutrition Interventions:  Interventions: Tube feeding  RN Pressure Injury Documentation: Pressure Injury 04/20/18 Deep Tissue Injury - Purple or maroon  localized area of  discolored intact skin or blood-filled blister due to damage of underlying soft tissue from pressure and/or shear. deep purple discoloration around buttocks and sacral area (Active)  04/20/18 0215  Location: Sacrum  Location Orientation: Medial  Staging: Deep Tissue Injury - Purple or maroon localized area of discolored intact skin or blood-filled blister due to damage of underlying soft tissue from pressure and/or shear.  Wound Description (Comments): deep purple discoloration around buttocks and sacral area  Present on Admission: Yes     Consultants:   Surgery ENT.   Oncology   Palliative care   Procedures:     Antimicrobials:       Subjective: Patient very weak and deconditioned, denies any dyspnea but he dose look in mild distress. No nausea or vomiting, no chest pain. Still using high flow oxygen.   Objective: Vitals:   05/01/18 0321 05/01/18 0421 05/01/18 0723 05/01/18 0750  BP: 123/76   116/70  Pulse: (!) 101  (!) 101 93  Resp: (!) 22  (!) 24 18  Temp: 98.2 F (36.8 C)   98.5 F (36.9 C)  TempSrc: Oral   Oral  SpO2: 90% 90% (!) 88% 90%  Weight:      Height:        Intake/Output Summary (Last 24 hours) at 05/01/2018 0934 Last data filed at 05/01/2018 0558 Gross per 24 hour  Intake 1266.14 ml  Output 200 ml  Net 1066.14 ml   Filed Weights   04/27/18 0437 04/29/18 0416 04/30/18 0418  Weight: 61.8 kg 49.5 kg 57.2 kg    Examination:   General: deconditioned and ill looking appearing  Neurology: Awake and alert, non focal  E ENT: mild  pallor, no icterus, oral mucosa moist Cardiovascular: No JVD. S1-S2 present, rhythmic, no gallops, rubs, or murmurs. No lower extremity edema. Pulmonary: positive breath sounds bilaterally, poor air movement, no wheezing, but bilateral rhonchi, no significant rales, anterior auscultation. . Gastrointestinal. Abdomen with no organomegaly, non tender, no rebound or guarding Skin. No rashes Musculoskeletal: no joint  deformities     Data Reviewed: I have personally reviewed following labs and imaging studies  CBC: Recent Labs  Lab 04/24/18 1141 04/26/18 0344 04/27/18 0644 04/28/18 0620 04/30/18 0341  WBC 6.2 6.5 7.2 10.0 12.1*  NEUTROABS  --  4.3 5.3  --   --   HGB 12.0* 11.6* 10.9* 11.2* 11.5*  HCT 35.6* 35.6* 33.3* 33.9* 34.5*  MCV 94.2 94.9 96.8 96.3 94.3  PLT 337 322 351 373 932*   Basic Metabolic Panel: Recent Labs  Lab 04/24/18 1141 04/24/18 1142 04/24/18 1833 04/25/18 0444 04/25/18 1834 04/26/18 0344 04/28/18 0620 04/30/18 0341  NA 140  --   --  141  --  140 142 143  K 3.4*  --   --  3.5  --  4.4 4.1 3.5  CL 104  --   --  106  --  108 97* 102  CO2 26  --   --  27  --  23 32 32  GLUCOSE 102*  --   --  126*  --  100* 148* 115*  BUN <5*  --   --  6*  --  8 9 14   CREATININE 0.68  --   --  0.76  --  0.71 0.90 0.96  CALCIUM 8.8*  --   --  8.9  --  8.8* 9.5 9.3  MG  --  1.8 1.9 1.9 2.0 2.1  --   --  PHOS  --  2.5 2.2* 2.2* 1.8* 2.2*  --   --    GFR: Estimated Creatinine Clearance: 50.5 mL/min (by C-G formula based on SCr of 0.96 mg/dL). Liver Function Tests: Recent Labs  Lab 04/26/18 0344  ALBUMIN 2.0*   No results for input(s): LIPASE, AMYLASE in the last 168 hours. No results for input(s): AMMONIA in the last 168 hours. Coagulation Profile: No results for input(s): INR, PROTIME in the last 168 hours. Cardiac Enzymes: No results for input(s): CKTOTAL, CKMB, CKMBINDEX, TROPONINI in the last 168 hours. BNP (last 3 results) No results for input(s): PROBNP in the last 8760 hours. HbA1C: No results for input(s): HGBA1C in the last 72 hours. CBG: Recent Labs  Lab 04/30/18 1927 04/30/18 1929 04/30/18 2301 05/01/18 0318 05/01/18 0751  GLUCAP 68* 87 124* 94 99   Lipid Profile: No results for input(s): CHOL, HDL, LDLCALC, TRIG, CHOLHDL, LDLDIRECT in the last 72 hours. Thyroid Function Tests: No results for input(s): TSH, T4TOTAL, FREET4, T3FREE, THYROIDAB in the  last 72 hours. Anemia Panel: No results for input(s): VITAMINB12, FOLATE, FERRITIN, TIBC, IRON, RETICCTPCT in the last 72 hours.    Radiology Studies: I have reviewed all of the imaging during this hospital visit personally     Scheduled Meds: . apixaban  5 mg Per Tube BID  . chlorhexidine  15 mL Mouth Rinse BID  . Chlorhexidine Gluconate Cloth  6 each Topical Q0600  . diltiazem  30 mg Per Tube Q8H  . guaiFENesin-dextromethorphan  15 mL Per Tube BID  . ipratropium-albuterol  3 mL Nebulization BID  . mouth rinse  15 mL Mouth Rinse q12n4p  . mirabegron ER  50 mg Oral Daily  . mometasone-formoterol  2 puff Inhalation BID  . nicotine  14 mg Transdermal Daily  . pantoprazole sodium  40 mg Per Tube Daily  . QUEtiapine  25 mg Per J Tube QHS  . tamsulosin  0.4 mg Oral QPC supper   Continuous Infusions: . sodium chloride Stopped (04/25/18 0453)  . sodium chloride 50 mL/hr at 05/01/18 0502     LOS: 12 days        Vitali Seibert Gerome Apley, MD

## 2018-05-01 NOTE — Progress Notes (Signed)
[place] / [facility])  Received request from [place] for family interest in [place]. Chart reviewed and eligibility has been confirmed. [place] room is available for Mr. Rosann Auerbach Saturday. Attempted contact with both daughters but no answer. [place] is working Saturday and will follow up with family.  Thank you,  Erling Conte, [facility] Liaison  [place] are listed daily on AMION under Hospice and [place]

## 2018-05-01 NOTE — Progress Notes (Signed)
SLP Cancellation Note  Patient Details Name: Kurt [name] MRN: [id number] DOB: Jan 31, 1941   Cancelled treatment:       Reason Eval/Treat Not Completed: Other (comment) Discussed with palliative care provider, who says that plan is now for pt to d/c to hospice house. Comfort diet has already been initiated, says no further SLP needs identified at this time. Please reorder SLP if we can be of assistance.   Kurt [name] 05/01/2018, 2:55 PM  Kurt [name], M.A. [place] Acute Environmental education officer [phone] Office [phone]

## 2018-05-01 NOTE — Progress Notes (Signed)
CSW spoke with Harmon Pier regarding placement at [facility]. They don't have a bed available today but could have one open up in the morning.   CSW will continue to follow and assist with discharge planning.   Domenic Schwab, MSW, [place]

## 2018-05-01 NOTE — Progress Notes (Signed)
   Palliative Care conversation with family (all of them at once) scheduled for today (3/27) at 1:00 pm to hopefully make coordinated decisions regarding trach, feeding, and disposition.  Florentina Jenny, PA-C Palliative Medicine Pager: [phone]

## 2018-05-01 NOTE — Care Management Important Message (Signed)
Important Message  Patient Details  Name: Kurt [name] MRN: [id number] Date of Birth: 10/31/1940   Medicare Important Message Given:  Yes    [name] [name] 05/01/2018, 4:41 PM

## 2018-05-01 NOTE — Progress Notes (Signed)
Physical Therapy Treatment Patient Details Name: Kurt [name] MRN: [id number] DOB: [birth year] Today's Date: 05/01/2018    History of Present Illness 78 year old male with history of COPD, atrial fibrillation, tobacco use, BPH presented with complaints of cough, shortness of breath and fevers with T-max of 102F x2 days and was noted to be in A. fib with RVR.  Patient admitted for COPD exacerbation with pneumonia/hypoxic respiratory failure requiring BiPAP therapy.  He also has poor dentition and is on aspiration precautions/coverage.  His flu and CoVid19 testing resulted negative. 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 Per notes, plan is to get trach and follow with palliative care.     PT Comments    Patient refusing OOB mobility citing he is too weak. Agreeable to therex bed level. Noted palliative meeting scheduled for  today will cont to follow.      Follow Up Recommendations  SNF     Equipment Recommendations  Other (comment)    Recommendations for Other Services OT consult     Precautions / Restrictions Precautions Precautions: Fall Precaution Comments: ng tube Restrictions Weight Bearing Restrictions: No    Mobility  Bed Mobility Overal bed mobility: Needs Assistance Bed Mobility: Rolling Rolling: Min assist         General bed mobility comments: states he does not want to get out of bed today  Transfers                    Ambulation/Gait                 Stairs             Wheelchair Mobility    Modified Rankin (Stroke Patients Only)       Balance Overall balance assessment: Needs assistance Sitting-balance support: Feet supported;No upper extremity supported Sitting balance-Leahy Scale: Fair                                      Cognition Arousal/Alertness: Awake/alert Behavior During Therapy: Flat affect Overall Cognitive Status: Within Functional Limits for tasks  assessed Area of Impairment: Following commands;Awareness;Problem solving;Safety/judgement                                      Exercises      General Comments        Pertinent Vitals/Pain Pain Assessment: Faces Faces Pain Scale: Hurts a little bit Pain Location: bottom  Pain Descriptors / Indicators: Grimacing Pain Intervention(s): Limited activity within patient's tolerance;Monitored during session    Home Living                      Prior Function            PT Goals (current goals can now be found in the care plan section) Acute Rehab PT Goals Patient Stated Goal: To go home PT Goal Formulation: With patient Time For Goal Achievement: 05/08/18 Potential to Achieve Goals: Fair Progress towards PT goals: Progressing toward goals    Frequency    Min 2X/week      PT Plan Discharge plan needs to be updated;Frequency needs to be updated    Co-evaluation              AM-PAC PT "6 Clicks" Mobility   Outcome Measure  Help needed turning from your back to your side while in a flat bed without using bedrails?: A Little Help needed moving from lying on your back to sitting on the side of a flat bed without using bedrails?: A Little Help needed moving to and from a bed to a chair (including a wheelchair)?: A Lot Help needed standing up from a chair using your arms (e.g., wheelchair or bedside chair)?: A Lot Help needed to walk in hospital room?: A Lot Help needed climbing 3-5 steps with a railing? : Total 6 Click Score: 13    End of Session Equipment Utilized During Treatment: Oxygen Activity Tolerance: Patient limited by fatigue;Treatment limited secondary to medical complications (Comment) Patient left: in bed;with call bell/phone within reach;with bed alarm set Nurse Communication: Mobility status PT Visit Diagnosis: Unsteadiness on feet (R26.81);Difficulty in walking, not elsewhere classified (R26.2)     Time: 4707-6151 PT  Time Calculation (min) (ACUTE ONLY): 20 min  Charges:  $Therapeutic Exercise: 8-22 mins                     Reinaldo Berber, PT, DPT Acute Rehabilitation Services Pager: [phone] Office: [phone]     Reinaldo Berber 05/01/2018, 11:00 AM

## 2018-05-01 NOTE — Progress Notes (Signed)
Daily Progress Note   Patient Name: Kurt [name]       Date: 05/01/2018 DOB: [birth year]  Age: 78 y.o. MRN#: [id number] Attending Physician: Tawni Millers Primary Care Physician: [name], NP Admit Date: 04/18/2018  Reason for Consultation/Follow-up: Establishing goals of care, Non pain symptom management and Psychosocial/spiritual support  Subjective: At bedside with Kurt [name], I used face time to conference on his daughters  Kurt [name] and Kurt [name], his sister Kurt [name], his wife and his brother in Kurt [name].  We discussed the tumor in his neck.  We discussed his breathing, nutrition, whether or not to move forward with a tracheostomy.  [name] himself would like to eat and drink despite the risk of aspiration.  His family supports  His decision. As he is breathing ok on high flow oxygen and is going to hospice house the family has decided against Tracheostomy.    He is able to eat apple sauce and take sips of coffee from a spoon, but he does cough with every 2nd or 3rd bite.  The family understands he is actively aspirating.  More than anything family wants to be with him.  They understand his time is very short (we talked about 1 to 2 weeks) as he will contract aspiration pneumonia again (even if he aspirates on his own saliva) his breathing status is very tenuous.   He is currently satting 85 - 90% on 25L 42f high flow [place] with FIO2 of 60%.   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  Recurrent aspiration secondary to tumor.  Not eligible for chemo or radiation.  Does not want surgery, trach or PEG.  Patient Profile/HPI:  78 y.o. male  with past medical history of COPD,  Afib, tobacco abuse, hx of ETOH, DVT who was admitted on 04/14/2018 with SOB, fever, and afib with RVR.  He was ruled out for influenza and coronavirus.  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  He has been evaluated by ENT.  An Oncology consultation is pending.  Length of Stay: 12  Current Medications: Scheduled Meds:  . apixaban  5 mg Per Tube BID  .  chlorhexidine  15 mL Mouth Rinse BID  . Chlorhexidine Gluconate Cloth  6 each Topical Q0600  . diltiazem  30 mg Per Tube Q8H  . guaiFENesin-dextromethorphan  15 mL Per Tube BID  . ipratropium-albuterol  3 mL Nebulization BID  . LORazepam  0.25 mg Intravenous BID  . mouth rinse  15 mL Mouth Rinse q12n4p  . mirabegron ER  50 mg Oral Daily  . mometasone-formoterol  2 puff Inhalation BID  .  morphine injection  2 mg Intravenous Q4H  . nicotine  14 mg Transdermal Daily  . pantoprazole sodium  40 mg Per Tube Daily  . QUEtiapine  25 mg Per J Tube QHS  . tamsulosin  0.4 mg Oral QPC supper    Continuous Infusions: . sodium chloride Stopped (04/25/18 0453)  . sodium chloride 50 mL/hr at 05/01/18 1331    PRN Meds: sodium chloride, acetaminophen, ipratropium-albuterol, LORazepam, metoprolol tartrate, morphine injection, ondansetron **OR** ondansetron (ZOFRAN) IV  Physical Exam       Well developed thin, frail gentleman, awake, alert, on 25L high flow.  Difficult to understand as he can only speak in a whisper. NAD.    Vital Signs: BP 116/70 (BP Location: Left Arm)   Pulse 93   Temp 98.5 F (36.9 C) (Oral)   Resp 18   Ht 6' (1.829 m)   Wt 57.2 kg   SpO2 90%   BMI 17.10 kg/m  SpO2: SpO2: 90 % O2 Device: O2 Device: Nasal Cannula O2 Flow Rate: O2 Flow Rate (L/min): 25 L/min  Intake/output summary:   Intake/Output Summary (Last 24 hours) at 05/01/2018 1342 Last data filed at 05/01/2018 1100 Gross per 24 hour  Intake 1266.14 ml  Output  250 ml  Net 1016.14 ml   LBM: Last BM Date: 04/28/18 Baseline Weight: Weight: 78 kg Most recent weight: Weight: 57.2 kg       Palliative Assessment/Data:  30%    Flowsheet Rows     Most Recent Value  Intake Tab  Referral Department  Hospitalist  Unit at Time of Referral  Intermediate Care Unit  Palliative Care Primary Diagnosis  Cancer  Date Notified  04/29/18  Palliative Care Type  New Palliative care  Reason for referral  Clarify Goals of Care  Date of Admission  05/02/2018  Date first seen by Palliative Care  04/29/18  # of days Palliative referral response time  0 Day(s)  # of days IP prior to Palliative referral  10  Clinical Assessment  Psychosocial & Spiritual Assessment  Palliative Care Outcomes      Patient Active Problem List   Diagnosis Date Noted  . Metastatic cancer ([place])   . Laryngeal mass   . Palliative care encounter   . Protein-calorie malnutrition, severe 04/24/2018  . Pressure injury of skin 04/21/2018  . COPD exacerbation ([place]) 04/20/2018  . Community acquired pneumonia 04/20/2018  . Acute respiratory failure with hypoxia ([place]) 04/16/2018  . Anxiety 01/21/2017  . HLD (hyperlipidemia) 01/21/2017  . BPH (benign prostatic hyperplasia) 01/23/2015  . Atrial fibrillation with RVR ([place]) 10/31/2009  . INSOMNIA, CHRONIC 12/21/2007  . LUMBOSACRAL SPONDYLOSIS WITHOUT MYELOPATHY 12/21/2007  . COPD mixed type ([place]) 07/24/2007  . GERD 07/24/2007    Palliative Care Plan    Recommendations/Plan:  Full comfort.  Transfer to [facility] when a bed becomes available.  [facility] rep Erling Conte is checking on whether or not their [place] location offers 25L high flow.  Full liquid diet with aspiration precautions and  feeding assistance.  Goals of Care and Additional Recommendations:  Limitations on Scope of Treatment: Full Comfort Care  Code Status:  DNR  Prognosis:   < 2 weeks   Discharge Planning:  Hospice facility  Care  plan was discussed with family, CSW, Hospice, [facility] attending.  Thank you for allowing the Palliative Medicine Team to assist in the care of this patient.  Total time spent:  60 min.     Greater than 50%  of this time was spent counseling and coordinating care related to the above assessment and plan.  Florentina Jenny, PA-C Palliative Medicine  Please contact Palliative MedicineTeam phone at [phone] for questions and concerns between 7 am - 7 pm.   Please see AMION for individual provider pager numbers.

## 2018-05-01 NOTE — Progress Notes (Signed)
CSW called Harmon Pier with [facility] to make [facility] referral. She stated that the patient seemed familiar and was already referred.   She stated that [facility] may have rooms available, she just had to call and make sure. Harmon Pier stated that she would get back with the social worker.   CSW will continue to follow.   Domenic Schwab, MSW, [place]

## 2018-05-01 NOTE — Progress Notes (Signed)
Occupational Therapy Treatment Patient Details Name: Kurt [name] MRN: [id number] DOB: 08-17-1940 Today's Date: 05/01/2018    History of present illness 78 year old male with history of COPD, atrial fibrillation, tobacco use, BPH presented with complaints of cough, shortness of breath and fevers with T-max of 102F x2 days and was noted to be in A. fib with RVR.  Patient admitted for COPD exacerbation with pneumonia/hypoxic respiratory failure requiring BiPAP therapy.  He also has poor dentition and is on aspiration precautions/coverage.  His flu and CoVid19 testing resulted negative. 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 Per notes, plan is to get trach and follow with palliative care.    OT comments  Pt performing bed mobility with modA; pt sitting EOB supported by BUEs for 3 mins prior to returning to supine with HOB elevated for light grooming tasks after set-upA. Pt performing ADLs with increased assist and raspy voice due to new mass found. Pt not progressing very well and updated POC to SNF due to pt's inability to tolerate inpatient acute rehab. Pt would benefit from continued OT skilled services for ADL, mobility and safety in SNF setting. OT to follow acutely.  O2 on 25L high flow@ FiO2 at 60% >90% with exertion.    Follow Up Recommendations  SNF;Supervision/Assistance - 24 hour    Equipment Recommendations  3 in 1 bedside commode    Recommendations for Other Services      Precautions / Restrictions Precautions Precautions: Fall Precaution Comments: ng tube Restrictions Weight Bearing Restrictions: No       Mobility Bed Mobility Overal bed mobility: Needs Assistance Bed Mobility: Rolling;Sidelying to Sit Rolling: Min assist Sidelying to sit: Min assist       General bed mobility comments: Pt having difficulty sitting upright unsupported sitting.  Transfers Overall transfer level: Needs assistance                     Balance Overall balance assessment: Needs assistance Sitting-balance support: Feet supported;No upper extremity supported Sitting balance-Leahy Scale: Fair Sitting balance - Comments: B UE support required, fatigues easily                                    ADL either performed or assessed with clinical judgement   ADL Overall ADL's : Needs assistance/impaired                                     Functional mobility during ADLs: Maximal assistance General ADL Comments: Pt sitting EOB for cleaning mouth with swab and mouth wash with intermittent suction.     Vision       Perception     Praxis      Cognition Arousal/Alertness: Awake/alert Behavior During Therapy: Flat affect Overall Cognitive Status: Within Functional Limits for tasks assessed Area of Impairment: Following commands;Awareness;Problem solving;Safety/judgement                               General Comments: fatigues easily and wheezing after any exertion        Exercises     Shoulder Instructions       General Comments      Pertinent Vitals/ Pain       Pain Assessment: Faces Faces Pain Scale: Hurts a little  bit Pain Location: bottom  Pain Descriptors / Indicators: Grimacing Pain Intervention(s): Limited activity within patient's tolerance  Home Living                                          Prior Functioning/Environment              Frequency  Min 3X/week        Progress Toward Goals  OT Goals(current goals can now be found in the care plan section)  Progress towards OT goals: Progressing toward goals  Acute Rehab OT Goals Patient Stated Goal: To go home OT Goal Formulation: With patient Time For Goal Achievement: 05/10/18 Potential to Achieve Goals: Good ADL Goals Pt Will Perform Grooming: with supervision;sitting Pt Will Perform Lower Body Dressing: with supervision;sit to/from stand Pt Will Transfer to Toilet:  with supervision;ambulating;bedside commode Pt/caregiver will Perform Home Exercise Program: Increased strength;Both right and left upper extremity;With written HEP provided Additional ADL Goal #1: Pt will follow 2 step commands with 90% accuracy in order to maximize participation and independence with ADLs.  Plan Discharge plan remains appropriate    Co-evaluation                 AM-PAC OT "6 Clicks" Daily Activity     Outcome Measure   Help from another person eating meals?: Total Help from another person taking care of personal grooming?: Total Help from another person toileting, which includes using toliet, bedpan, or urinal?: A Lot Help from another person bathing (including washing, rinsing, drying)?: A Lot Help from another person to put on and taking off regular upper body clothing?: A Lot Help from another person to put on and taking off regular lower body clothing?: A Lot 6 Click Score: 10    End of Session Equipment Utilized During Treatment: Oxygen  OT Visit Diagnosis: Other abnormalities of gait and mobility (R26.89);Muscle weakness (generalized) (M62.81)   Activity Tolerance Patient limited by fatigue   Patient Left in bed;with call bell/phone within reach;with bed alarm set   Nurse Communication          Time: 0812-0830 OT Time Calculation (min): 18 min  Charges: OT General Charges $OT Visit: 1 Visit OT Treatments $Self Care/Home Management : 8-22 mins  Darryl Nestle) Marsa Aris OTR/L Acute Rehabilitation Services Pager: [phone] Office: [phone]    Fredda Hammed 05/01/2018, 1:57 PM

## 2018-05-02 DIAGNOSIS — R0603 Acute respiratory distress: Secondary | ICD-10-CM

## 2018-05-02 DIAGNOSIS — Z7189 Other specified counseling: Secondary | ICD-10-CM

## 2018-05-02 DIAGNOSIS — Z515 Encounter for palliative care: Secondary | ICD-10-CM

## 2018-05-02 MED ORDER — LORAZEPAM 2 MG/ML IJ SOLN: 0.5000 mg | Freq: Two times a day (BID) | INTRAMUSCULAR | Status: DC

## 2018-05-02 MED ORDER — MORPHINE BOLUS VIA INFUSION
2.0000 mg | INTRAVENOUS | Status: DC | PRN
  Filled 2018-05-02: qty 4

## 2018-05-02 MED ORDER — GLYCOPYRROLATE 0.2 MG/ML IJ SOLN
0.2000 mg | Freq: Four times a day (QID) | INTRAMUSCULAR | Status: DC
  Administered 2018-05-02 (×2): 0.2 mg via INTRAVENOUS
  Filled 2018-05-02 (×2): qty 1

## 2018-05-02 MED ORDER — LORAZEPAM 2 MG/ML IJ SOLN: 0.5000 mg | INTRAMUSCULAR | Status: DC | PRN

## 2018-05-02 MED ORDER — MORPHINE 100MG IN NS 100ML (1MG/ML) PREMIX INFUSION
1.0000 mg/h | INTRAVENOUS | Status: DC
  Administered 2018-05-02: 1 mg/h via INTRAVENOUS
  Filled 2018-05-02: qty 100

## 2018-05-02 NOTE — Discharge Summary (Signed)
Physician Discharge Summary  MADOX CORKINS RCV:[id number] DOB: 02/15/1940 DOA: 04/28/2018  PCP: [name], NP  Admit date: 04/27/2018 Discharge date: 05/02/2018  Admitted From: Home  Disposition:  SNF   Recommendations for Outpatient Follow-up and new medication changes:  1. Patient with imminent death, severe respiratory distress despite supplemental oxygen per high flow nasal cannula. Death is imminent. Will hold on discharge for now, will place patient on a morphine drip. I spoke with his daughter and explained her the patient's condition. All questions were addressed.    Home Health: na   Equipment/Devices: na    Discharge Condition: Patient with imminent death. CODE STATUS: DNR   Diet recommendation: Pureed with aspiration precautions.   Brief/Interim Summary: 78 year old male who presented with dyspnea.  He does have significant past medical history for COPD, atrial fibrillation, tobacco abuse and BPH.  He reported worsening dyspnea for the last 48 hours prior to hospitalization, associated with wheezing and increased mucus production.  On his initial physical examination he was febrile 102 F, his heart rate was 122, respiratory rate 33, oxygen saturation 96% on supplemental oxygen.  His lungs had positive breath sounds bilaterally, no wheezing, heart S1-S2 present, irregularly irregular, tachycardic, abdomen soft, no lower extremity edema.  Sodium 143, potassium 3.8, chloride 102, bicarb 28, glucose 153, BUN 19, creatinine 0.96, white count 8.8, hemoglobin 11.3, hematocrit 34.4, platelets 347.  His chest radiograph had bilateral lower lobe infiltrates, more prominent on the right.  EKG had 132 bpm, atrial flutter with variable block.  Patient was admitted to the hospital with a working diagnosis of acute hypoxic respiratory failure complicated by sepsis due to bilateral lower lobe pneumonia.  Patient had a prolonged hospital stay, he has required noninvasive mechanical  ventilation and high flow nasal cannula for persistent hypoxemia, his influenza and COVID-19 were negative.  He was diagnosed with a large laryngeal mass.  ENT was consulted with recommendations for tracheostomy.  Patient's family has requested palliative care consultation. Patient with very poor prognosis, now has been made comfort care and transferred to hospice.   1.  Acute hypoxic respiratory failure due to bilateral lower lobe aspiration pneumonia, (present on admission).  Patient was admitted to the progressive care unit, he received IV fluids and broad-spectrum IV antibiotic therapy. He was initially placed on a airborne precautions, influenza and COVID 19 were ruled out.  Patient was evaluated by speech therapy, found to have a high risk for aspiration, nothing by mouth protocol was started and an NG tube was placed for tube feeds.  He had a prolonged hospital stay, high oxygen requirements per high flow nasal cannula, currently requiring 25 LPM with oxygen saturation of 93%.  He completed his antibiotic therapy during his hospitalization.  He remained very weak and deconditioned.  2.  Acute metabolic encephalopathy with delirium.  Patient very weak and deconditioned, disoriented, he received nutritional supplementation per tube feeds.  Added Seroquel.  3.  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  Further work-up with neck soft tissue CT scan, showed large laryngeal carcinoma, predominantly left-sided and supraglottic.  Invasion through the thyroid cartilage and partial destruction of the left arytenoid cartilage.  Multiple enlarged and abnormal density left cervical lymph nodes consistent with metastatic disease.  ENT was consulted with recommendations for total laryngectomy or just palliative tracheostomy.  Oncology was consulted with recommendations for no aggressive treatment due to very poor functional status and poor prognosis.  Palliative care was consulted, patient's  family decided to continue care  under hospice services.  Patient will be allowed to have by mouth feedings for comfort.  4.  Chronic atrial fibrillation/flutter.  Patient will continue diltiazem for rate control, he has been changed to short acting agent in order to crush medication, to prevent further aspiration.  Risk versus benefit apixaban will be discontinued in the setting of poor prognosis and risk of bleeding.  5.  Severe calorie protein malnutrition.  Patient was evaluated by dietitian and patient received tube feeds.  6.  Pressure ulcer, present on admission.  Deep tissue injury at the sacrum, unstageable.   Discharge Diagnoses:  Principal Problem:   Acute respiratory failure with hypoxia ([place]) Active Problems:   Atrial fibrillation with RVR (HCC)   COPD exacerbation (HCC)   Community acquired pneumonia   Pressure injury of skin   Protein-calorie malnutrition, severe   Laryngeal mass   Palliative care encounter   Metastatic cancer [facility])    Discharge Instructions   Allergies as of 05/02/2018   No Known Allergies     Medication List    STOP taking these medications   budesonide-formoterol 80-4.5 MCG/ACT inhaler Commonly known as:  SYMBICORT   diltiazem 180 MG 24 hr capsule Commonly known as:  CARDIZEM CD   Eliquis 5 MG Tabs tablet Generic drug:  apixaban   gabapentin 300 MG capsule Commonly known as:  NEURONTIN   omeprazole 20 MG capsule Commonly known as:  PRILOSEC   tamsulosin 0.4 MG Caps capsule Commonly known as:  FLOMAX   traZODone 50 MG tablet Commonly known as:  DESYREL       No Known Allergies  Consultations:  ENT  Oncology   Palliative Care    Procedures/Studies: Dg Abd 1 View  Result Date: 04/27/2018 CLINICAL DATA:  Abdominal pain EXAM: ABDOMEN - 1 VIEW COMPARISON:  Fluoroscopy 3 days ago FINDINGS: Feeding tube tip at the pylorus, unchanged. Bowel gas pattern is normal. No abnormal stool retention. No concerning mass effect or  gas collection. Lower lobe airspace opacity as seen on contemporaneous chest x-ray IMPRESSION: Normal bowel gas pattern. Stable feeding tube position with tip at the pylorus. Electronically Signed   By: Monte Fantasia M.D.   On: 04/27/2018 07:11   Dg Abd 1 View  Result Date: 04/24/2018 CLINICAL DATA:  Feeding tube placement EXAM: ABDOMEN - 1 VIEW COMPARISON:  None. FINDINGS: Feeding tube has been placed to the proximal duodenum, confirmed with contrast injection. IMPRESSION: Feeding tube placement to proximal duodenum. Electronically Signed   By: Lucrezia Europe M.D.   On: 04/24/2018 13:43   Ct Soft Tissue Neck W Contrast  Result Date: 04/28/2018 CLINICAL DATA:  Solitary neck mass EXAM: CT NECK WITH CONTRAST TECHNIQUE: Multidetector CT imaging of the neck was performed using the standard protocol following the bolus administration of intravenous contrast. CONTRAST:  104mL OMNIPAQUE IOHEXOL 300 MG/ML  SOLN COMPARISON:  None. FINDINGS: Pharynx and larynx: There is a large tumor of the glottic and supraglottic larynx, crossing the anterior commissure, but predominantly located on the left side. Tumor invades through the thyroid cartilage and into the arytenoid cartilage. The tumor extends along the left aryepiglottic fold to the level of the epiglottis. The pre epiglottic fat is preserved. Pharynx and oral cavity are unremarkable. Salivary glands: No inflammation, mass, or stone. Thyroid: Normal. Lymph nodes: There are multiple enlarged and/or abnormal density lymph nodes. The largest is located at left level 5A and measures 4.1 x 3.7 cm (3:88) with a large area of central hypoattenuation. A more superior, necrotic  left level 5A node measures 8 mm (3:69). Vascular: The left internal jugular vein is occluded at the level of the superior horn of the thyroid cartilage. Unclear whether it is actually invaded by the laryngeal mass, or occluded due to mass effect from nearby lymphadenopathy. The carotid arteries remain  patent. Limited intracranial: Normal Visualized orbits: Normal Mastoids and visualized paranasal sinuses: Clear. Skeleton: No acute or aggressive process. Upper chest: Emphysema Other: None IMPRESSION: 1. Large laryngeal carcinoma, predominantly left-sided and supraglottic, but crossing the anterior commissure, extending superiorly along the left aryepiglottic fold to the level of the epiglottis and inferiorly to the level of the glottis. Invasion through the thyroid cartilage and partial destruction of the left arytenoid cartilage. 2. Multiple enlarged and/or abnormal density left cervical lymph nodes, consistent with metastatic disease. The largest node is located at level 5A and measures up to 4.1 cm, with a large area of central necrosis. 3. 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 Invasion from the laryngeal tumor seems more likely (3:71). 4.  Emphysema (ICD10-J43.9). Electronically Signed   By: Ulyses Jarred M.D.   On: 04/28/2018 22:25   Dg Chest Port 1 View  Result Date: 04/27/2018 CLINICAL DATA:  Hypoxia EXAM: PORTABLE CHEST 1 VIEW COMPARISON:  Four days ago FINDINGS: Feeding tube at least reaches the stomach. Improved aeration but persistent dense airspace opacity at the bases. No generalized Kerley lines, effusion, or pneumothorax. IMPRESSION: Improved aeration but persistent dense lower lobe airspace/pneumonia. Electronically Signed   By: Monte Fantasia M.D.   On: 04/27/2018 06:48   Dg Chest Port 1 View  Result Date: 04/23/2018 CLINICAL DATA:  History of COPD. Patient admitted 04/20/2018 after cough, fever and shortness of breath for 2 days. EXAM: PORTABLE CHEST 1 VIEW COMPARISON:  Single-view of the chest 04/17/2018. PA and lateral chest 03/03/2018. FINDINGS: The lungs are emphysematous. Bilateral mid and lower lung zone airspace disease has worsened since the  most recent examination. No pneumothorax. There are likely small bilateral pleural effusions. Heart size is enlarged. Aortic atherosclerosis noted. No acute or focal bony abnormality. IMPRESSION: Worsened bilateral airspace disease and small bilateral pleural effusions could be due to increased pulmonary edema and/or pneumonia. Emphysema. Electronically Signed   By: Inge Rise M.D.   On: 04/23/2018 11:53   Dg Chest Port 1 View  Result Date: 04/09/2018 CLINICAL DATA:  Shortness of breath, difficulty breathing EXAM: PORTABLE CHEST 1 VIEW COMPARISON:  03/03/2018 FINDINGS: Bilateral lower lobe airspace opacities are noted, worsening since prior study compatible with worsening pneumonia. Mild hyperinflation/COPD. Mild cardiomegaly. No visible significant effusions or acute bony abnormality. IMPRESSION: Hyperinflation/COPD. Worsening bilateral lower lobe airspace opacities concerning for pneumonia. Electronically Signed   By: Rolm Baptise M.D.   On: 04/16/2018 19:31   Dg Swallowing Func-speech Pathology  Result Date: 04/28/2018 Objective Swallowing Evaluation: Type of Study: MBS-Modified Barium Swallow Study  Patient Details Name: NORVEL WENKER MRN: [id number] Date of Birth: 1940-06-13 Today's Date: 04/28/2018 Time: SLP Start Time (ACUTE ONLY): 1502 -SLP Stop Time (ACUTE ONLY): 1528 SLP Time Calculation (min) (ACUTE ONLY): 26 min Past Medical History: Past Medical History: Diagnosis Date . Abnormal chest x-ray  . Alcohol abuse  . Anxiety  . Atrial fibrillation ([place])  . Chronic insomnia  . Cigarette smoker  . Colonic polyp  . COPD (chronic obstructive pulmonary disease) ([place])  . Diverticulosis of colon  . DVT (deep  venous thrombosis) ([place])  . Fatigue  . GERD (gastroesophageal reflux disease)  . History of pneumonia  . Hypercholesterolemia  . Lumbosacral spondylosis without myelopathy  . Neck pain  . Urinary frequency  Past Surgical History: Past Surgical History: Procedure Laterality Date . 3 seperate lumbar  laminectomies   . right CTS release    Dr. Fredna Dow 12/00 HPI: 78yo w/ a hx of GERD, COPD, PNA, atrial fibrillation, tobacco abuse, and BPH who presented w/ 2 days of shortness of breath.  CXR concerning for PNA in the bilateral bases. COVID-19 negative. Pt had a prior MBS in 2011 but results are not available. Pt describes several months of trouble swallowing, dysphonia, throat pain, and ear pain PTA.  Subjective: pt says he feels "weak" Assessment / Plan / Recommendation CHL IP CLINICAL IMPRESSIONS 04/28/2018 Clinical Impression Pt has a mild oral and severe pharyngeal dysphagia with suspicion for anatomical abnormalities contributing. Abnormal tissue appears to be present around the arytenoids and glottis, with attending MD notified of concern but radiologist not present to interperet. Oral phase is likely impacted by mild weakness, with slow posterior transit. Pharyngeally, pt has reduced epiglottic deflection and airway closure, which may be structural in nature. He has mild residue with thin liquids in his valleculae, increasing to moderate residue as boluses become thicker. Pt did not trigger a pharyngeal swallow with purees, letting them sit in his valleculae. SLP provided Max visual and verbal cueing, including use of video feedback, to elicit a volitional swallow. During pt's swallow phase he does not achieve adequate laryngeal closure, and subsequently aspirates thin and nectar thick liquids. Penetration occurs with purees. Airway compromise is primarily silent, but when he did cough, it was weak and ineffective at clearing the airway. Pt is not safe for a PO diet at this time. Discussed with MD my recommendation for pursuing underlying etiology of dysphagia to facilitate POC moving forward. In the mean time, would continue to use temporary alternative means of nutrition. SLP Visit Diagnosis Dysphagia, oropharyngeal phase (R13.12) Attention and concentration deficit following -- Frontal lobe and executive  function deficit following -- Impact on safety and function Severe aspiration risk;Risk for inadequate nutrition/hydration   CHL IP TREATMENT RECOMMENDATION 04/28/2018 Treatment Recommendations Therapy as outlined in treatment plan below   Prognosis 04/28/2018 Prognosis for Safe Diet Advancement Guarded Barriers to Reach Goals Severity of deficits;Other (Comment) Barriers/Prognosis Comment -- CHL IP DIET RECOMMENDATION 04/28/2018 SLP Diet Recommendations NPO;Alternative means - temporary Liquid Administration via -- Medication Administration Via alternative means Compensations -- Postural Changes --   CHL IP OTHER RECOMMENDATIONS 04/28/2018 Recommended Consults Consider ENT evaluation Oral Care Recommendations Oral care QID Other Recommendations Have oral suction available   CHL IP FOLLOW UP RECOMMENDATIONS 04/28/2018 Follow up Recommendations (No Data)   CHL IP FREQUENCY AND DURATION 04/28/2018 Speech Therapy Frequency (ACUTE ONLY) min 2x/week Treatment Duration 2 weeks      CHL IP ORAL PHASE 04/28/2018 Oral Phase Impaired Oral - Pudding Teaspoon -- Oral - Pudding Cup -- Oral - Honey Teaspoon -- Oral - Honey Cup -- Oral - Nectar Teaspoon Weak lingual manipulation;Reduced posterior propulsion;Decreased bolus cohesion Oral - Nectar Cup -- Oral - Nectar Straw -- Oral - Thin Teaspoon Weak lingual manipulation;Reduced posterior propulsion;Decreased bolus cohesion Oral - Thin Cup -- Oral - Thin Straw -- Oral - Puree Weak lingual manipulation;Reduced posterior propulsion;Decreased bolus cohesion Oral - Mech Soft -- Oral - Regular -- Oral - Multi-Consistency -- Oral - Pill -- Oral Phase - Comment --  CHL  IP PHARYNGEAL PHASE 04/28/2018 Pharyngeal Phase Impaired Pharyngeal- Pudding Teaspoon -- Pharyngeal -- Pharyngeal- Pudding Cup -- Pharyngeal -- Pharyngeal- Honey Teaspoon -- Pharyngeal -- Pharyngeal- Honey Cup -- Pharyngeal -- Pharyngeal- Nectar Teaspoon Reduced epiglottic inversion;Reduced airway/laryngeal  closure;Penetration/Aspiration during swallow;Pharyngeal residue - valleculae;Reduced tongue base retraction Pharyngeal Material enters airway, passes BELOW cords without attempt by patient to eject out (silent aspiration) Pharyngeal- Nectar Cup -- Pharyngeal -- Pharyngeal- Nectar Straw -- Pharyngeal -- Pharyngeal- Thin Teaspoon Reduced epiglottic inversion;Reduced airway/laryngeal closure;Penetration/Aspiration during swallow;Pharyngeal residue - valleculae;Reduced tongue base retraction Pharyngeal Material enters airway, passes BELOW cords and not ejected out despite cough attempt by patient Pharyngeal- Thin Cup -- Pharyngeal -- Pharyngeal- Thin Straw -- Pharyngeal -- Pharyngeal- Puree Reduced epiglottic inversion;Reduced airway/laryngeal closure;Penetration/Aspiration during swallow;Pharyngeal residue - valleculae;Reduced tongue base retraction;Delayed swallow initiation-vallecula Pharyngeal Material enters airway, remains ABOVE vocal cords and not ejected out Pharyngeal- Mechanical Soft -- Pharyngeal -- Pharyngeal- Regular -- Pharyngeal -- Pharyngeal- Multi-consistency -- Pharyngeal -- Pharyngeal- Pill -- Pharyngeal -- Pharyngeal Comment --  CHL IP CERVICAL ESOPHAGEAL PHASE 04/28/2018 Cervical Esophageal Phase WFL Pudding Teaspoon -- Pudding Cup -- Honey Teaspoon -- Honey Cup -- Nectar Teaspoon -- Nectar Cup -- Nectar Straw -- Thin Teaspoon -- Thin Cup -- Thin Straw -- Puree -- Mechanical Soft -- Regular -- Multi-consistency -- Pill -- Cervical Esophageal Comment -- Venita Sheffield Nix 04/28/2018, 4:11 PM  Pollyann Glen, M.A. [facility] Pager [phone] Office [phone]                 Subjective: Patient is in respiratory distress, not responsive, not reactive.   Discharge Exam: Vitals:   05/02/18 0735 05/02/18 0816  BP: 120/74   Pulse: 96   Resp: (!) 22   Temp: 97.7 F (36.5 C)   SpO2:  93%   Vitals:   05/01/18 2252 05/01/18 2300 05/02/18 0735 05/02/18 0816  BP: 93/69   120/74   Pulse: (!) 101  96   Resp: (!) 25  (!) 22   Temp: 98.6 F (37 C)  97.7 F (36.5 C)   TempSrc: Oral  Oral   SpO2: 94% 93%  93%  Weight:      Height:        General: deconditioned and ill looking appearing  Neurology: Unresponsive, not following commands.  E ENT: positive pallor, no icterus, oral mucosa dry/ positive accessory muscle use.  Cardiovascular: No JVD. S1-S2 present, irregular, no gallops, rubs, or murmurs. No lower extremity edema. Pulmonary: positive breath sounds bilaterally, poor air movement, due to poor inspiratory effort. Gastrointestinal. Abdomen with no organomegaly, non tender, no rebound or guarding Skin. No rashes Musculoskeletal: no joint deformities   The results of significant diagnostics from this hospitalization (including imaging, microbiology, ancillary and laboratory) are listed below for reference.     Microbiology: No results found for this or any previous visit (from the past 240 hour(s)).   Labs: BNP (last 3 results) Recent Labs    04/20/2018 1933 04/24/18 0917  BNP 135.3* 998.3*   Basic Metabolic Panel: Recent Labs  Lab 04/25/18 1834 04/26/18 0344 04/28/18 0620 04/30/18 0341  NA  --  140 142 143  K  --  4.4 4.1 3.5  CL  --  108 97* 102  CO2  --  23 32 32  GLUCOSE  --  100* 148* 115*  BUN  --  8 9 14   CREATININE  --  0.71 0.90 0.96  CALCIUM  --  8.8* 9.5 9.3  MG 2.0 2.1  --   --  PHOS 1.8* 2.2*  --   --    Liver Function Tests: Recent Labs  Lab 04/26/18 0344  ALBUMIN 2.0*   No results for input(s): LIPASE, AMYLASE in the last 168 hours. No results for input(s): AMMONIA in the last 168 hours. CBC: Recent Labs  Lab 04/26/18 0344 04/27/18 0644 04/28/18 0620 04/30/18 0341  WBC 6.5 7.2 10.0 12.1*  NEUTROABS 4.3 5.3  --   --   HGB 11.6* 10.9* 11.2* 11.5*  HCT 35.6* 33.3* 33.9* 34.5*  MCV 94.9 96.8 96.3 94.3  PLT 322 351 373 423*   Cardiac Enzymes: No results for input(s): CKTOTAL, CKMB, CKMBINDEX,  TROPONINI in the last 168 hours. BNP: Invalid input(s): POCBNP CBG: Recent Labs  Lab 05/01/18 0318 05/01/18 0751 05/01/18 1316 05/01/18 1648 05/01/18 1945  GLUCAP 94 99 114* 153* 245*   D-Dimer No results for input(s): DDIMER in the last 72 hours. Hgb A1c No results for input(s): HGBA1C in the last 72 hours. Lipid Profile No results for input(s): CHOL, HDL, LDLCALC, TRIG, CHOLHDL, LDLDIRECT in the last 72 hours. Thyroid function studies No results for input(s): TSH, T4TOTAL, T3FREE, THYROIDAB in the last 72 hours.  Invalid input(s): FREET3 Anemia work up No results for input(s): VITAMINB12, FOLATE, FERRITIN, TIBC, IRON, RETICCTPCT in the last 72 hours. Urinalysis    Component Value Date/Time   COLORURINE STRAW (A) 01/15/2016 1316   APPEARANCEUR CLEAR 01/15/2016 1316   LABSPEC 1.002 (L) 01/15/2016 1316   PHURINE 6.0 01/15/2016 1316   GLUCOSEU NEGATIVE 01/15/2016 1316   GLUCOSEU NEGATIVE 03/29/2010 1611   HGBUR NEGATIVE 01/15/2016 1316   BILIRUBINUR NEGATIVE 01/15/2016 1316   KETONESUR NEGATIVE 01/15/2016 1316   PROTEINUR NEGATIVE 01/15/2016 1316   UROBILINOGEN 0.2 03/29/2010 1611   NITRITE NEGATIVE 01/15/2016 1316   LEUKOCYTESUR NEGATIVE 01/15/2016 1316   Sepsis Labs Invalid input(s): PROCALCITONIN,  WBC,  LACTICIDVEN Microbiology No results found for this or any previous visit (from the past 240 hour(s)).   Time coordinating discharge: 45 minutes  SIGNED:   Tawni Millers, MD  [facility] 05/02/2018, 8:25 AM

## 2018-05-02 NOTE — Progress Notes (Signed)
CSW received a phone call from Kurt [name] with [facility]. Patient is no longer stable enough for transport. They will anticipate a hospital death.   Domenic Schwab, MSW, [place]

## 2018-05-02 NOTE — Plan of Care (Signed)
  Problem: Clinical Measurements: Goal: Ability to maintain a body temperature in the normal range will improve Outcome: Adequate for Discharge   Problem: Respiratory: Goal: Ability to maintain adequate ventilation will improve Outcome: Adequate for Discharge   Problem: Respiratory: Goal: Ability to maintain a clear airway will improve Outcome: Progressing

## 2018-05-02 NOTE — Progress Notes (Signed)
  Palliative medicine progress note  Patient seen, chart reviewed.  Patient is tachypneic, mottled appears to be actively dying  Chart review, attending, has already ordered a morphine continuous infusion.  Family has been notified and are on their way to the hospital.  Patient is still responsive to voice and light touch and can answer simple yes and no questions.  He does appear to be restless, anxious likely due to air hunger  His daughter Marlowe Kays and her husband arrived to the unit.  They are prepared that death could occur in a matter of hours  Assessment and plan  Once family has had an option to talk with Mr. Rolly Salter, start morphine drip.  Will order drip 1 to 5 mg basal rate and 2 to 4 mg every 20 minutes as needed for pain or air hunger  Mild upper airway congestion starting.  Will start scheduled Robinul  We will uptitrate Ativan  Liberalize visitation.  Patient likely has a prognosis of just hours  Too unstable for transport to residential hospice.  Anticipate hospital death Plan of care explained  to family and they are in agreement with patient's plan to stay in the hospital for end-of-life care  Thank you, Romona Curls, NP Time: 35 minutes Greater than 50% of time was spent in counseling and coordination of care

## 2018-05-05 SURGERY — CREATION, TRACHEOSTOMY
Anesthesia: General | Laterality: Left

## 2018-05-06 NOTE — Progress Notes (Signed)
Pt has no pulse and is not breathing. Irish Lack, RN pronounced pt dead at [phone]. Daughters Marlowe Kays and Harrie Jeans were at bedside at time of death. On Call  Hospitalist X. Kennon Holter was made aware of pt's death. Postmortem care was initiated.

## 2018-05-06 DEATH — deceased
